# Patient Record
Sex: Female | Born: 1990 | Race: White | Hispanic: No | Marital: Married | Smoking: Never smoker
Health system: Southern US, Community
[De-identification: ages and names within clinical notes are randomized; demographics above are authoritative.]

## PROBLEM LIST (undated history)

## (undated) ENCOUNTER — Inpatient Hospital Stay (HOSPITAL_COMMUNITY): Payer: Self-pay

## (undated) DIAGNOSIS — R55 Syncope and collapse: Secondary | ICD-10-CM

## (undated) DIAGNOSIS — Z8619 Personal history of other infectious and parasitic diseases: Secondary | ICD-10-CM

## (undated) DIAGNOSIS — R002 Palpitations: Secondary | ICD-10-CM

## (undated) DIAGNOSIS — O468X9 Other antepartum hemorrhage, unspecified trimester: Secondary | ICD-10-CM

## (undated) DIAGNOSIS — G43909 Migraine, unspecified, not intractable, without status migrainosus: Secondary | ICD-10-CM

## (undated) DIAGNOSIS — O209 Hemorrhage in early pregnancy, unspecified: Secondary | ICD-10-CM

## (undated) DIAGNOSIS — I4729 Other ventricular tachycardia: Secondary | ICD-10-CM

## (undated) DIAGNOSIS — N809 Endometriosis, unspecified: Secondary | ICD-10-CM

## (undated) DIAGNOSIS — Z8489 Family history of other specified conditions: Secondary | ICD-10-CM

## (undated) DIAGNOSIS — Z872 Personal history of diseases of the skin and subcutaneous tissue: Secondary | ICD-10-CM

## (undated) DIAGNOSIS — N390 Urinary tract infection, site not specified: Secondary | ICD-10-CM

## (undated) DIAGNOSIS — O418X90 Other specified disorders of amniotic fluid and membranes, unspecified trimester, not applicable or unspecified: Secondary | ICD-10-CM

## (undated) DIAGNOSIS — Z9889 Other specified postprocedural states: Secondary | ICD-10-CM

## (undated) DIAGNOSIS — K08409 Partial loss of teeth, unspecified cause, unspecified class: Secondary | ICD-10-CM

## (undated) DIAGNOSIS — R112 Nausea with vomiting, unspecified: Secondary | ICD-10-CM

## (undated) DIAGNOSIS — Z86718 Personal history of other venous thrombosis and embolism: Secondary | ICD-10-CM

## (undated) DIAGNOSIS — K649 Unspecified hemorrhoids: Secondary | ICD-10-CM

## (undated) DIAGNOSIS — I472 Ventricular tachycardia: Secondary | ICD-10-CM

## (undated) DIAGNOSIS — Z87442 Personal history of urinary calculi: Secondary | ICD-10-CM

## (undated) DIAGNOSIS — I499 Cardiac arrhythmia, unspecified: Secondary | ICD-10-CM

## (undated) DIAGNOSIS — N83209 Unspecified ovarian cyst, unspecified side: Secondary | ICD-10-CM

## (undated) HISTORY — DX: Other specified disorders of amniotic fluid and membranes, unspecified trimester, not applicable or unspecified: O41.8X90

## (undated) HISTORY — DX: Ventricular tachycardia: I47.2

## (undated) HISTORY — DX: Palpitations: R00.2

## (undated) HISTORY — DX: Unspecified hemorrhoids: K64.9

## (undated) HISTORY — DX: Personal history of other venous thrombosis and embolism: Z86.718

## (undated) HISTORY — DX: Other ventricular tachycardia: I47.29

## (undated) HISTORY — DX: Cardiac arrhythmia, unspecified: I49.9

## (undated) HISTORY — DX: Other antepartum hemorrhage, unspecified trimester: O46.8X9

## (undated) HISTORY — DX: Personal history of other infectious and parasitic diseases: Z86.19

---
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
## 2009-01-04 ENCOUNTER — Ambulatory Visit: Payer: Self-pay | Admitting: Internal Medicine

## 2009-01-11 ENCOUNTER — Ambulatory Visit: Payer: Self-pay | Admitting: Internal Medicine

## 2009-01-11 DIAGNOSIS — R0989 Other specified symptoms and signs involving the circulatory and respiratory systems: Secondary | ICD-10-CM

## 2009-01-11 DIAGNOSIS — R0609 Other forms of dyspnea: Secondary | ICD-10-CM | POA: Insufficient documentation

## 2009-03-27 ENCOUNTER — Ambulatory Visit (HOSPITAL_COMMUNITY): Admission: RE | Admit: 2009-03-27 | Discharge: 2009-03-27 | Payer: Self-pay | Admitting: Obstetrics & Gynecology

## 2009-06-28 ENCOUNTER — Ambulatory Visit: Payer: Self-pay | Admitting: Family

## 2009-06-28 DIAGNOSIS — J019 Acute sinusitis, unspecified: Secondary | ICD-10-CM | POA: Insufficient documentation

## 2009-06-28 DIAGNOSIS — J309 Allergic rhinitis, unspecified: Secondary | ICD-10-CM | POA: Insufficient documentation

## 2009-08-08 ENCOUNTER — Encounter (INDEPENDENT_AMBULATORY_CARE_PROVIDER_SITE_OTHER): Payer: Self-pay | Admitting: *Deleted

## 2009-08-08 ENCOUNTER — Telehealth: Payer: Self-pay | Admitting: Family Medicine

## 2009-08-08 ENCOUNTER — Ambulatory Visit: Payer: Self-pay | Admitting: Family Medicine

## 2009-08-08 DIAGNOSIS — Z87448 Personal history of other diseases of urinary system: Secondary | ICD-10-CM

## 2009-08-08 LAB — CONVERTED CEMR LAB
Bilirubin Urine: NEGATIVE
Glucose, Urine, Semiquant: NEGATIVE
Ketones, urine, test strip: NEGATIVE
Nitrite: NEGATIVE
Protein, U semiquant: NEGATIVE
Specific Gravity, Urine: 1.02
Urobilinogen, UA: 0.2
WBC Urine, dipstick: NEGATIVE
pH: 6.5

## 2009-08-11 ENCOUNTER — Telehealth (INDEPENDENT_AMBULATORY_CARE_PROVIDER_SITE_OTHER): Payer: Self-pay | Admitting: *Deleted

## 2009-08-22 ENCOUNTER — Ambulatory Visit: Payer: Self-pay | Admitting: Family Medicine

## 2009-08-22 LAB — CONVERTED CEMR LAB
BUN: 13 mg/dL (ref 6–23)
Bilirubin Urine: NEGATIVE
Blood in Urine, dipstick: NEGATIVE
CO2: 30 meq/L (ref 19–32)
Calcium: 9.5 mg/dL (ref 8.4–10.5)
Chloride: 104 meq/L (ref 96–112)
Creatinine, Ser: 0.7 mg/dL (ref 0.4–1.2)
GFR calc non Af Amer: 115.97 mL/min (ref >60)
Glucose, Bld: 91 mg/dL (ref 70–99)
Glucose, Urine, Semiquant: NEGATIVE
Hgb A1c MFr Bld: 5.4 % (ref 4.6–6.5)
Ketones, urine, test strip: NEGATIVE
Nitrite: NEGATIVE
Potassium: 4.4 meq/L (ref 3.5–5.1)
Protein, U semiquant: NEGATIVE
Sodium: 139 meq/L (ref 135–145)
Specific Gravity, Urine: 1.02
Urobilinogen, UA: 0.2
WBC Urine, dipstick: NEGATIVE
pH: 5

## 2010-04-20 ENCOUNTER — Inpatient Hospital Stay (HOSPITAL_COMMUNITY)
Admission: AD | Admit: 2010-04-20 | Discharge: 2010-04-20 | Payer: Self-pay | Source: Home / Self Care | Attending: Obstetrics | Admitting: Obstetrics

## 2010-04-30 LAB — URINALYSIS, ROUTINE W REFLEX MICROSCOPIC
Bilirubin Urine: NEGATIVE
Hgb urine dipstick: NEGATIVE
Ketones, ur: NEGATIVE mg/dL
Nitrite: NEGATIVE
Protein, ur: NEGATIVE mg/dL
Specific Gravity, Urine: 1.01 (ref 1.005–1.030)
Urine Glucose, Fasting: NEGATIVE mg/dL
Urobilinogen, UA: 0.2 mg/dL (ref 0.0–1.0)
pH: 6.5 (ref 5.0–8.0)

## 2010-04-30 LAB — WET PREP, GENITAL
Clue Cells Wet Prep HPF POC: NONE SEEN
Trich, Wet Prep: NONE SEEN
Yeast Wet Prep HPF POC: NONE SEEN

## 2010-05-17 NOTE — Assessment & Plan Note (Signed)
Summary: allergies/kdc   Vital Signs:  Patient profile:   20 year old female Weight:      129.38 pounds BMI:     21.94 Temp:     97.8 degrees F oral Pulse rate:   72 / minute Pulse rhythm:   regular Resp:     16 per minute BP sitting:   120 / 72  (left arm) Cuff size:   regular  Vitals Entered By: Mervin Kung CMA (June 28, 2009 8:40 AM) CC: room 16  Sinus drainage x 2-3 weeks. Now has sore throat.   Primary Care Provider:  Laury Axon  CC:  room 16  Sinus drainage x 2-3 weeks. Now has sore throat.Marland Kitchen  History of Present Illness: Ms Jessica Diaz is a 20 year old female who presents today with c/o sinus pressure, green sinusdrainage, sore throat.  These symptoms started 3 weeks ago.  Denies fever but does note + fatigue.  Mild cough.  Has used OTC sinus/allergy med- with minimal relief.  Denies visual changes, notes + chronic floaters in left eye for which she sees eye Dr.    Allergies: 1)  ! * Narcotics  Physical Exam  General:  Well-developed,well-nourished,in no acute distress; alert,appropriate and cooperative throughout examination.  Voice has nasally sound Head:  Normocephalic and atraumatic without obvious abnormalities. No apparent alopecia or balding. Ears:  External ear exam shows no significant lesions or deformities.  Otoscopic examination reveals clear canals, tympanic membranes are intact bilaterally without bulging, retraction, inflammation or discharge. Hearing is grossly normal bilaterally. Neck:  palpable cervical lymph nodes without significant enlargement Lungs:  Normal respiratory effort, chest expands symmetrically. Lungs are clear to auscultation, no crackles or wheezes. Heart:  Normal rate and regular rhythm. S1 and S2 normal without gallop, murmur, click, rub or other extra sounds.   Impression & Recommendations:  Problem # 1:  SINUSITIS (ICD-473.9) Assessment New Will plan to treat with amoxicillin.  Pt advised to follow up as listed in pt  instructions The following medications were removed from the medication list:    Amoxicillin-pot Clavulanate 875-125 Mg Tabs (Amoxicillin-pot clavulanate) .Marland Kitchen... 1 two times a day with food Her updated medication list for this problem includes:    Amoxicillin 500 Mg Cap (Amoxicillin) .Marland Kitchen... Take 1 capsule by mouth three times a day x 10 days    Flonase 50 Mcg/act Susp (Fluticasone propionate) .Marland Kitchen..Marland Kitchen Two sprays each nostril once daily  Problem # 2:  ALLERGIC RHINITIS (ICD-477.9) Assessment: New Notes chronic nasal congestion in the spring.  Will add claritin and flonase.   Her updated medication list for this problem includes:    Claritin 10 Mg Tabs (Loratadine) ..... One tablet by mouth by mouth daily    Flonase 50 Mcg/act Susp (Fluticasone propionate) .Marland Kitchen..Marland Kitchen Two sprays each nostril once daily  Complete Medication List: 1)  Amoxicillin 500 Mg Cap (Amoxicillin) .... Take 1 capsule by mouth three times a day x 10 days 2)  Claritin 10 Mg Tabs (Loratadine) .... One tablet by mouth by mouth daily 3)  Flonase 50 Mcg/act Susp (Fluticasone propionate) .... Two sprays each nostril once daily  Patient Instructions: 1)  Call if you develop fever over 101, increasing sinus pressure, pain with eye movement, increased facial tenderness of swelling, or if you develop visual changes. Prescriptions: FLONASE 50 MCG/ACT SUSP (FLUTICASONE PROPIONATE) two sprays each nostril once daily  #1 x 2   Entered and Authorized by:   Lemont Fillers FNP   Signed by:   Lemont Fillers  FNP on 06/28/2009   Method used:   Electronically to        [facility].* (retail)       [address]       [place], [place]  [address]       Ph: [phone]       Fax: [phone]   RxID:   [id number] AMOXICILLIN 500 MG CAP (AMOXICILLIN) Take 1 capsule by mouth three times a day X 10 days  #30 x 0   Entered and Authorized by:   Lemont Fillers FNP   Signed by:   Lemont Fillers FNP on 06/28/2009   Method used:   Electronically to        [facility] (retail)       [address]       [place], [place]  [address]       Ph: [phone]       Fax: [phone]   RxID:   [id number]   Current Allergies (reviewed today): ! * NARCOTICS

## 2010-05-17 NOTE — Letter (Signed)
Summary: Out of Work  [facility] [employer]  [address] [place], [place] [address]   Phone: [phone]  Fax: [phone]    August 08, 2009   Employee:  Rowland Lathe    To Whom It May Concern:   For Medical reasons, please excuse the above named employee from work for the following dates:  Start:   August 07, 2009  End:   August 08, 2009  If you need additional information, please feel free to contact our office.         Sincerely,    Loreen Freud, DO

## 2010-05-17 NOTE — Progress Notes (Signed)
Summary: ATB change  Phone Note Call from Patient Call back at Home Phone [phone]   Summary of Call: Pt was prescribed Levaquin. Her insurance does not cover and she cannot afford the medication. Could you leave change to a cheaper ATB. Army Fossa CMA  August 08, 2009 2:31 PM  Initial call taken by: Army Fossa CMA,  August 08, 2009 2:31 PM  Follow-up for Phone Call        augmentin 875 two times a day for 10 days Follow-up by: Loreen Freud DO,  August 08, 2009 2:48 PM  Additional Follow-up for Phone Call Additional follow up Details #1::        Pt is aware.Army Fossa CMA  August 08, 2009 2:58 PM     New/Updated Medications: AUGMENTIN 875-125 MG TABS (AMOXICILLIN-POT CLAVULANATE) 1 by mouth two times a day for 10 days. Prescriptions: AUGMENTIN 875-125 MG TABS (AMOXICILLIN-POT CLAVULANATE) 1 by mouth two times a day for 10 days.  #20 x 0   Entered by:   Army Fossa CMA   Authorized by:   Loreen Freud DO   Signed by:   Army Fossa CMA on 08/08/2009   Method used:   Electronically to        [facility].* (retail)       [address]       [place], [place]  [address]       Ph: [phone]       Fax: [phone]   RxID:   [id number]

## 2010-05-17 NOTE — Assessment & Plan Note (Signed)
Summary: congested/cbs   Vital Signs:  Patient profile:   20 year old female Weight:      126 pounds Temp:     98.4 degrees F oral Pulse rate:   76 / minute Pulse rhythm:   regular BP sitting:   122 / 76  (left arm) Cuff size:   regular  Vitals Entered By: Army Fossa CMA (August 08, 2009 8:09 AM) CC: Pt here c/o sinus pressure and head congestion, chest congestion  since last thursday. Has been taking Zyrtec., URI symptoms, Back Pain   History of Present Illness:       This is a 20 year old woman who presents with URI symptoms.  The symptoms began 5 days ago.  The patient complains of nasal congestion, purulent nasal discharge, productive cough, and sick contacts, but denies sore throat and earache.  The patient denies fever, low-grade fever (<100.5 degrees), fever of 100.5-103 degrees, fever of 103.1-104 degrees, fever to >104 degrees, stiff neck, dyspnea, wheezing, rash, vomiting, diarrhea, use of an antipyretic, and response to antipyretic.  The patient also reports sneezing, headache, and severe fatigue.  The patient denies itchy watery eyes, itchy throat, seasonal symptoms, response to antihistamine, and muscle aches.  The patient denies the following risk factors for Strep sinusitis: unilateral facial pain, unilateral nasal discharge, poor response to decongestant, double sickening, tooth pain, Strep exposure, tender adenopathy, and absence of cough.           The patient also presents with Back Pain.  The symptoms began 1 week ago.  No known injury  + urinary frequency.  The patient denies fever, chills, weakness, loss of sensation, fecal incontinence, urinary incontinence, urinary retention, dysuria, rest pain, inability to work, and inability to care for self.  The pain is located in the right low back and left low back.  The pain began at home and suddenly.  The pain is made worse by urination.    Allergies: 1)  ! * Narcotics  Past History:  Past medical, surgical, family  and social histories (including risk factors) reviewed for relevance to current acute and chronic problems.  Past Medical History: Reviewed history from 06/25/2007 and no changes required. anxiety endometriosis  Past Surgical History: Reviewed history from 06/25/2007 and no changes required. [id number] ENDOMETRIOSIS-MENOPUSE TREATMENT LEFT KNEE SURGERY HOSPITILIZED FOR GI PROBLEMS 6TH GRADE (AGE 38)  Family History: Reviewed history from 07/05/2008 and no changes required. MOTHER:LIVING, LUMP REMOVED RIGHT BREAST FATHER:LIVING 4 SISTERS: LIVING OPEN HEART SURGERY-FATHER AGE 39 HEART ATTACK-MGF AGE 102 DM-MGGM LUNG CANCER-PGF Family History of Cholesterol Disease: FAT Family History Diabetes 1st degree relative---Mom with gestational  Social History: Reviewed history from 06/25/2007 and no changes required. Negative history of passive tobacco smoke exposure.  Care taker verifies today that the child's current immunizations are up to date.  Not using alcohol Not using substances of abuse  Review of Systems      See HPI  Physical Exam  General:  Well-developed,well-nourished,in no acute distress; alert,appropriate and cooperative throughout examination Ears:  External ear exam shows no significant lesions or deformities.  Otoscopic examination reveals clear canals, tympanic membranes are intact bilaterally without bulging, retraction, inflammation or discharge. Hearing is grossly normal bilaterally. Nose:  no external deformity, L frontal sinus tenderness, and R frontal sinus tenderness.   Mouth:  Oral mucosa and oropharynx without lesions or exudates.  Teeth in good repair. Neck:  No deformities, masses, or tenderness noted. Lungs:  Normal respiratory effort, chest expands symmetrically. Lungs are  clear to auscultation, no crackles or wheezes. Heart:  Normal rate and regular rhythm. S1 and S2 normal without gallop, murmur, click, rub or other extra sounds. Abdomen:  no flank  pain with palpation no pain with movement Psych:  Cognition and judgment appear intact. Alert and cooperative with normal attention span and concentration. No apparent delusions, illusions, hallucinations   Impression & Recommendations:  Problem # 1:  SINUSITIS (ICD-473.9)  The following medications were removed from the medication list:    Flonase 50 Mcg/act Susp (Fluticasone propionate) .Marland Kitchen..Marland Kitchen Two sprays each nostril once daily Her updated medication list for this problem includes:    Levaquin 500 Mg Tabs (Levofloxacin) .Marland Kitchen... 1 by mouth once daily    Cheratussin Ac 100-10 Mg/28ml Syrp (Guaifenesin-codeine) .Marland Kitchen... 1-2 tsp by mouth at bedtime as needed cough    Veramyst 27.5 Mcg/spray Susp (Fluticasone furoate) .Marland Kitchen... 2 sprays each nostril once daily  Orders: UA Dipstick w/o Micro (manual) (04540)  Take antibiotics for full duration. Discussed treatment options including indications for coronal CT scan of sinuses and ENT referral.   Problem # 2:  HEMATURIA, HX OF (ICD-V13.09)  Orders: T-Culture, Urine ([id number]) UA Dipstick w/o Micro (manual) (95621)  Complete Medication List: 1)  Levaquin 500 Mg Tabs (Levofloxacin) .Marland Kitchen.. 1 by mouth once daily 2)  Cheratussin Ac 100-10 Mg/21ml Syrp (Guaifenesin-codeine) .Marland Kitchen.. 1-2 tsp by mouth at bedtime as needed cough 3)  Veramyst 27.5 Mcg/spray Susp (Fluticasone furoate) .... 2 sprays each nostril once daily Prescriptions: CHERATUSSIN AC 100-10 MG/5ML SYRP (GUAIFENESIN-CODEINE) 1-2 tsp by mouth at bedtime as needed cough  #6 oz x 0   Entered and Authorized by:   Loreen Freud DO   Signed by:   Loreen Freud DO on 08/08/2009   Method used:   Print then Give to Patient   RxID:   [id number] LEVAQUIN 500 MG TABS (LEVOFLOXACIN) 1 by mouth once daily  #10 x 0   Entered and Authorized by:   Loreen Freud DO   Signed by:   Loreen Freud DO on 08/08/2009   Method used:   Electronically to        [facility] (retail)       [address]       [place], [place]  [address]       Ph: [phone]       Fax: [phone]   RxID:   [id number]   Laboratory Results   Urine Tests    Routine Urinalysis   Color: yellow Appearance: Clear Glucose: negative   (Normal Range: Negative) Bilirubin: negative   (Normal Range: Negative) Ketone: negative   (Normal Range: Negative) Spec. Gravity: 1.020   (Normal Range: 1.003-1.035) Blood: large   (Normal Range: Negative) pH: 6.5   (Normal Range: 5.0-8.0) Protein: negative   (Normal Range: Negative) Urobilinogen: 0.2   (Normal Range: 0-1) Nitrite: negative   (Normal Range: Negative) Leukocyte Esterace: negative   (Normal Range: Negative)    Comments: Army Fossa CMA  August 08, 2009 8:34 AM

## 2010-05-17 NOTE — Progress Notes (Signed)
Summary: Urine culture   Phone Note Outgoing Call   Call placed by: Army Fossa CMA,  August 11, 2009 8:15 AM Summary of Call: Regarding urine culture, LMTCB:  contaminated--- recheck if symptomatic Signed by Loreen Freud DO on 08/10/2009 at 4:18 PM  Follow-up for Phone Call        Pt is states that she has not had any back pain, no burning when urinating. Army Fossa CMA  August 11, 2009 9:01 AM

## 2010-06-26 ENCOUNTER — Inpatient Hospital Stay (HOSPITAL_COMMUNITY)
Admission: AD | Admit: 2010-06-26 | Discharge: 2010-06-26 | Disposition: A | Discharge status: Home / Self Care | Payer: Medicaid Other | Source: Ambulatory Visit | Attending: Obstetrics | Admitting: Obstetrics

## 2010-06-26 DIAGNOSIS — O212 Late vomiting of pregnancy: Secondary | ICD-10-CM | POA: Insufficient documentation

## 2010-06-26 LAB — URINALYSIS, ROUTINE W REFLEX MICROSCOPIC
Bilirubin Urine: NEGATIVE
Glucose, UA: NEGATIVE mg/dL
Hgb urine dipstick: NEGATIVE
Ketones, ur: >80 mg/dL — AB
Nitrite: NEGATIVE
Protein, ur: NEGATIVE mg/dL
Specific Gravity, Urine: 1.025 (ref 1.005–1.030)
Urobilinogen, UA: 0.2 mg/dL (ref 0.0–1.0)
pH: 7 (ref 5.0–8.0)

## 2010-07-11 ENCOUNTER — Inpatient Hospital Stay (HOSPITAL_COMMUNITY): Payer: Medicaid Other

## 2010-07-11 ENCOUNTER — Inpatient Hospital Stay (HOSPITAL_COMMUNITY)
Admission: AD | Admit: 2010-07-11 | Discharge: 2010-07-14 | DRG: 781 | Disposition: A | Discharge status: Home / Self Care | Payer: Medicaid Other | Source: Ambulatory Visit | Attending: Obstetrics & Gynecology | Admitting: Obstetrics & Gynecology

## 2010-07-11 ENCOUNTER — Encounter (HOSPITAL_COMMUNITY): Payer: Self-pay | Admitting: Radiology

## 2010-07-11 DIAGNOSIS — N200 Calculus of kidney: Secondary | ICD-10-CM

## 2010-07-11 DIAGNOSIS — R109 Unspecified abdominal pain: Secondary | ICD-10-CM | POA: Diagnosis present

## 2010-07-11 DIAGNOSIS — N1330 Unspecified hydronephrosis: Secondary | ICD-10-CM | POA: Diagnosis present

## 2010-07-11 DIAGNOSIS — R300 Dysuria: Secondary | ICD-10-CM | POA: Diagnosis present

## 2010-07-11 DIAGNOSIS — O99891 Other specified diseases and conditions complicating pregnancy: Principal | ICD-10-CM | POA: Diagnosis present

## 2010-07-11 DIAGNOSIS — O26839 Pregnancy related renal disease, unspecified trimester: Secondary | ICD-10-CM | POA: Diagnosis present

## 2010-07-11 DIAGNOSIS — N2889 Other specified disorders of kidney and ureter: Secondary | ICD-10-CM | POA: Diagnosis present

## 2010-07-11 LAB — URINALYSIS, ROUTINE W REFLEX MICROSCOPIC
Bilirubin Urine: NEGATIVE
Glucose, UA: NEGATIVE mg/dL
Ketones, ur: NEGATIVE mg/dL
Leukocytes, UA: NEGATIVE
Nitrite: NEGATIVE
Protein, ur: NEGATIVE mg/dL
Specific Gravity, Urine: 1.025 (ref 1.005–1.030)
Urobilinogen, UA: 0.2 mg/dL (ref 0.0–1.0)
pH: 6 (ref 5.0–8.0)

## 2010-07-11 LAB — URINE MICROSCOPIC-ADD ON

## 2010-07-12 ENCOUNTER — Ambulatory Visit (HOSPITAL_COMMUNITY): Payer: Medicaid Other

## 2010-07-12 LAB — URINE CULTURE
Colony Count: NO GROWTH
Culture  Setup Time: 201203281407
Culture: NO GROWTH

## 2010-07-12 LAB — CBC
HCT: 33.4 % — ABNORMAL LOW (ref 36.0–46.0)
Hemoglobin: 11 g/dL — ABNORMAL LOW (ref 12.0–15.0)
MCH: 29.6 pg (ref 26.0–34.0)
MCHC: 32.9 g/dL (ref 30.0–36.0)
MCV: 90 fL (ref 78.0–100.0)
Platelets: 169 10*3/uL (ref 150–400)
RBC: 3.71 MIL/uL — ABNORMAL LOW (ref 3.87–5.11)
RDW: 13.3 % (ref 11.5–15.5)
WBC: 10.9 10*3/uL — ABNORMAL HIGH (ref 4.0–10.5)

## 2010-07-12 LAB — COMPREHENSIVE METABOLIC PANEL
ALT: 18 U/L (ref 0–35)
AST: 21 U/L (ref 0–37)
Albumin: 2.4 g/dL — ABNORMAL LOW (ref 3.5–5.2)
Alkaline Phosphatase: 69 U/L (ref 39–117)
BUN: 4 mg/dL — ABNORMAL LOW (ref 6–23)
CO2: 22 mEq/L (ref 19–32)
Calcium: 8.4 mg/dL (ref 8.4–10.5)
Chloride: 107 mEq/L (ref 96–112)
Creatinine, Ser: 0.37 mg/dL — ABNORMAL LOW (ref 0.4–1.2)
GFR calc Af Amer: >60 mL/min (ref >60)
GFR calc non Af Amer: >60 mL/min (ref >60)
Glucose, Bld: 119 mg/dL — ABNORMAL HIGH (ref 70–99)
Potassium: 3.5 mEq/L (ref 3.5–5.1)
Sodium: 136 mEq/L (ref 135–145)
Total Bilirubin: 0.2 mg/dL — ABNORMAL LOW (ref 0.3–1.2)
Total Protein: 5.3 g/dL — ABNORMAL LOW (ref 6.0–8.3)

## 2010-07-12 NOTE — H&P (Signed)
  NAME:  Jessica Diaz, Jessica Diaz NO.:  [id number]  MEDICAL RECORD NO.:  [id number]           PATIENT TYPE:  I  LOCATION:  9158                          FACILITY:  [facility]  PHYSICIAN:  Roseanna Rainbow, M.D.DATE OF BIRTH:  Aug 25, 1990  DATE OF ADMISSION:  07/11/2010 DATE OF DISCHARGE:                             HISTORY & PHYSICAL   CHIEF COMPLAINT:  The patient is a 20 year old gravida 1, para 0 with an estimated date of confinement of Sep 07, 2010 with an intrauterine pregnancy at 31.5 weeks complaining of right-sided flank pain.  HISTORY OF PRESENT ILLNESS:  Please see the above.  The patient denies any fever, vaginal bleeding, recent trauma, rupture of membranes.  The patient also reports associated nausea, vomiting, and dysuria.  ALLERGIES:  PERCOCET, DECONGESTANTS.  MEDICATIONS:  Please see the medication reconciliation form.  PRENATAL LABS:  Chlamydia probe negative.  Urine culture and sensitivity, no growth.  Two-hour GTT normal.  Hepatitis B surface antigen negative.  Hemoglobin 11.6, hematocrit 35.6.  HIV nonreactive, blood type is A+, antibody screen negative.  Quad screen negative. Varicella immune.  Platelets 193,000.  Ultrasound on March 22, 2011 at 15 weeks and 5/7 days, dates the pregnancy.  The anatomic survey was limited.  PAST GYN HISTORY: 1. History of endometriosis. 2. Diagnostic laparoscopy.  PAST MEDICAL HISTORY: 1. Migraine headaches. 2. Heart murmur.  PAST SURGICAL HISTORY:  Please see the above.  Left knee arthroscopy.  SOCIAL HISTORY:  She is a Consulting civil engineer.  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  She is single, does not give any significant history of alcohol usage.  She has no significant smoking history.  Denies illicit drug use.  FAMILY HISTORY:  Remarkable for adult-onset diabetes, hypertension.  REVIEW OF SYSTEMS:  GU:  Please see the above.  PHYSICAL EXAMINATION:  VITAL SIGNS:  Stable, afebrile.  The fetal heart rate is 140 beats per  minute, moderate long-term variability. Tocodynamometer no uterine contractions. GENERAL:  Moderate distress. ABDOMEN:  Gravid. PELVIC:  Per the mid-level provider.  LABORATORY DATA:  Urinalysis micro, few squamous epithelial cells, rbc's 0-2 red blood cells per high power field, specific gravity 1.025, trace blood.  Renal ultrasound:  Moderate right and mild left renal pyelectasis.  ASSESSMENT:  Intrauterine pregnancy at 31 plus weeks, rule out urolithiasis.  PLAN:  Admission, supportive care, analgesics, IV hydration, strain the urine.     Roseanna Rainbow, M.D.     Judee Clara  D:  07/11/2010  T:  07/12/2010  Job:  [id number]  Electronically Signed by Antionette Char M.D. on 07/12/2010 07:18:29 PM

## 2010-07-18 NOTE — Discharge Summary (Signed)
  NAME:  Jessica Diaz, Jessica Diaz NO.:  [id number]  MEDICAL RECORD NO.:  [id number]           PATIENT TYPE:  I  LOCATION:  9158                          [facility]  PHYSICIAN:  Roseanna Rainbow, M.D.DATE OF BIRTH:  1990-06-15  DATE OF ADMISSION:  07/11/2010 DATE OF DISCHARGE:  07/14/2010                              DISCHARGE SUMMARY   CHIEF COMPLAINT:  The patient is a 20 year old gravida 1, para 0 with an estimated date of confinement of Sep 07, 2010, with an intrauterine pregnancy at 31.5 weeks, complaining of right-sided flank pain.  Please see the dictated history and physical.  HOSPITAL COURSE:  The patient was admitted.  She was given parenteral antibiotics for a presumptive early ascending urinary tract infection. She was transitioned from IV boluses of morphine to a morphine PCA. The urine was strained.  A renal ultrasound as well as an MRI of the abdomen and pelvis showed physiologic right hydronephrosis without evidence of obstruction.  The urine culture showed no growth.  Other laboratory data included a CBC and a CMET that were within normal limits as well.  The patient's pain gradually improved prior to discharge.  DISCHARGE DIAGNOSES: 1. Intrauterine pregnancy at 32 weeks. 2. Right-sided flank pain, questionable etiology, possible related to     the physiologic hydronephrosis of pregnancy and ureteral spasm.  CONDITION:  Stable.  DIET:  Regular.  ACTIVITY:  Ad lib.  MEDICATIONS: 1. Prenatal vitamins. 2. Ultram 1-2 tabs every 6 hours as needed.  DISPOSITION:  The patient has a followup appointment in the office for the following week.     Roseanna Rainbow, M.D.     Judee Clara  D:  07/14/2010  T:  07/15/2010  Job:  [id number]  Electronically Signed by Antionette Char M.D. on 07/18/2010 10:05:33 PM

## 2010-08-28 NOTE — Assessment & Plan Note (Signed)
[facility]                            CARDIOLOGY OFFICE NOTE   NAME:BLANKENSHIP, Aaryana A                 MRN:          [id number]  DATE:07/30/2007                            DOB:          04-26-1990    NEW PATIENT EVALUATION:   REASON FOR VISIT:  Chest pain, shortness of breath and palpitations.   Jessica Diaz is a very pleasant 20 year old woman who is the daughter of Mr.  Wendie Agreste, who we follow in our clinic.  She has a history  of endometriosis.  Otherwise, her medical history is unremarkable.  She  denies any history of known heart disease.  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   In the past she has had trouble with endometriosis and actually has  undergone surgery for this.  She was on Lupron Depot shots until late  last year when she was switched to [place].   She states that she has had minor episodes of chest pain in the past;  however, in December she fell down the stairs on her back and since that  time she notes that she has had episodes where she feels palpitations  come on and gets quite short of breath and has chest pain.  Her parents  say during these episodes she becomes quite pale and weak.  She has felt  presyncopal but never passed out completely.  These episodes usually  last about 10 minutes and resolve.  There are no apparent triggers.  At  times they happen on a daily basis, other times they just come and go.  She underwent an echocardiogram, which showed an EF of 60-65%.  There  was a flat closure of the mitral valve with no overt prolapse.  There  was trivial mitral regurgitation and there were no other abnormalities.  She denies any wheezing and she has not had any problems with exertion.  She does not do drugs.  Her periods have been quite regular.   In talking with her family more about this, her mother correlates the  onset of her symptoms  most closely with stopping her Lupron Depot shots.   REVIEW OF SYSTEMS:  Notable for headaches, otherwise all systems  negative except for HPI and problem list.   PROBLEM LIST:  Endometriosis as per HPI.   CURRENT MEDICATIONS:  Seasonique.   SOCIAL HISTORY:  She is a Holiday representative in Producer, television/film/video.  She lives with her  parents.  She has a boyfriend.  She does not smoke or drink alcohol.  She denies any drug use.   FAMILY HISTORY:  Maternal grandfather died at 78 of a myocardial  infarction.  Father has a history of aortic valve replacement.  Mother  has a history of migraines.   PHYSICAL EXAM:  She is a well-appearing woman in no acute distress.  Ambulates around the clinic without any difficulty.  Blood pressure is 110/72, heart rate is 84, weight is 128.  HEENT:  Normal.  NECK:  Supple.  There is no JVD.  Carotids are 2+ bilaterally without  any  bruits.  There is no lymphadenopathy or thyromegaly.  CARDIAC:  She has a prominent PMI but this is not displaced.  She has a  regular rate and rhythm.  No murmurs, rubs or gallops and no click.  LUNGS:  Clear.  ABDOMEN:  Soft, nontender, nondistended.  There is no  hepatosplenomegaly, no bruits, no masses.  Good bowel sounds.  EXTREMITIES:  Warm with no cyanosis, clubbing or edema.  No rash.  NEUROLOGIC:  Alert and oriented x3.  Cranial nerves II-XII are intact.  Moves all four extremities without difficulty.   EKG shows normal sinus rhythm with just minimal T-wave flattening in V2  and in lead III.  She has multiple strips from her CardioNet monitor,  all of which just show sinus arrhythmia with no significant arrhythmia.  She did have one episode she said, which was her most severe episode, in  which she was not wearing the monitor when it occurred.   ASSESSMENT/PLAN:  1. Chest pain, shortness of breath and palpitations.  I had a long      talk with Daylee and her family about the possible causes.  Given      the fact that she has had a  normal monitor, I do not think that      this is arrhythmogenic at this time.  Given the timeline, I am      quite concerned that these may be hormonal in nature.  I actually      spoke with Dr. Fransico Michael regarding possible further evaluation of      her hormonal status.  I suggest that we might even consider Effexor      if needed.  At this time she will go back and see Dr. Shon Baton for a      hormonal evaluation and if she continues to have symptoms, as I      said, we could consider low-dose Effexor.  Would not pursue further      cardiac workup at this time.  We will keep her monitor on one more      week to see if we can catch another major episode.     Bevelyn Buckles. Bensimhon, MD  Electronically Signed    DRB/MedQ  DD: 07/30/2007  DT: 07/30/2007  Job #: [id number]   cc:   Fransico Michael

## 2010-09-05 ENCOUNTER — Other Ambulatory Visit: Payer: Self-pay | Admitting: Obstetrics & Gynecology

## 2010-09-05 DIAGNOSIS — O480 Post-term pregnancy: Secondary | ICD-10-CM

## 2010-09-12 ENCOUNTER — Ambulatory Visit (HOSPITAL_COMMUNITY)
Admission: RE | Admit: 2010-09-12 | Discharge: 2010-09-12 | Disposition: A | Discharge status: Home / Self Care | Payer: Medicaid Other | Source: Ambulatory Visit | Attending: Obstetrics & Gynecology | Admitting: Obstetrics & Gynecology

## 2010-09-12 DIAGNOSIS — O480 Post-term pregnancy: Secondary | ICD-10-CM | POA: Insufficient documentation

## 2010-09-12 DIAGNOSIS — Z3689 Encounter for other specified antenatal screening: Secondary | ICD-10-CM | POA: Insufficient documentation

## 2010-09-16 ENCOUNTER — Inpatient Hospital Stay (HOSPITAL_COMMUNITY)
Admission: AD | Admit: 2010-09-16 | Discharge: 2010-09-19 | DRG: 775 | Disposition: A | Discharge status: Home / Self Care | Payer: Medicaid Other | Source: Ambulatory Visit | Attending: Obstetrics & Gynecology | Admitting: Obstetrics & Gynecology

## 2010-09-16 DIAGNOSIS — O480 Post-term pregnancy: Principal | ICD-10-CM | POA: Diagnosis present

## 2010-09-16 DIAGNOSIS — Z30430 Encounter for insertion of intrauterine contraceptive device: Secondary | ICD-10-CM

## 2010-09-16 LAB — CBC
Hemoglobin: 10.7 g/dL — ABNORMAL LOW (ref 12.0–15.0)
MCH: 27.1 pg (ref 26.0–34.0)
MCV: 84.3 fL (ref 78.0–100.0)
Platelets: 174 10*3/uL (ref 150–400)
RBC: 3.95 MIL/uL (ref 3.87–5.11)
WBC: 9 10*3/uL (ref 4.0–10.5)

## 2010-09-17 LAB — RPR: RPR Ser Ql: NONREACTIVE

## 2010-09-19 LAB — CBC
HCT: 31.1 % — ABNORMAL LOW (ref 36.0–46.0)
Hemoglobin: 9.9 g/dL — ABNORMAL LOW (ref 12.0–15.0)
MCH: 27.3 pg (ref 26.0–34.0)
MCHC: 31.8 g/dL (ref 30.0–36.0)
RBC: 3.63 MIL/uL — ABNORMAL LOW (ref 3.87–5.11)

## 2011-01-18 LAB — COMPREHENSIVE METABOLIC PANEL
BUN: 7 mg/dL (ref 6–23)
CO2: 24 mEq/L (ref 19–32)
Calcium: 9.4 mg/dL (ref 8.4–10.5)
Chloride: 104 mEq/L (ref 96–112)
Creatinine, Ser: 0.72 mg/dL (ref 0.4–1.2)
GFR calc non Af Amer: >60 mL/min (ref >60)
Glucose, Bld: 104 mg/dL — ABNORMAL HIGH (ref 70–99)
Total Bilirubin: 0.6 mg/dL (ref 0.3–1.2)

## 2011-01-18 LAB — CBC
HCT: 41.2 % (ref 36.0–46.0)
Hemoglobin: 13.6 g/dL (ref 12.0–15.0)
Platelets: 179 10*3/uL (ref 150–400)
RBC: 4.68 MIL/uL (ref 3.87–5.11)
WBC: 13.3 10*3/uL — ABNORMAL HIGH (ref 4.0–10.5)

## 2011-01-18 LAB — POCT I-STAT, CHEM 8
BUN: 5 mg/dL — ABNORMAL LOW (ref 6–23)
Calcium, Ion: 1.02 mmol/L — ABNORMAL LOW (ref 1.12–1.32)
Chloride: 106 mEq/L (ref 96–112)
HCT: 42 % (ref 36.0–46.0)
Hemoglobin: 13.6 g/dL (ref 12.0–15.0)
Potassium: 3.6 mEq/L (ref 3.5–5.1)
Sodium: 135 mEq/L (ref 135–145)
Sodium: 138 mEq/L (ref 135–145)
TCO2: 21 mmol/L (ref 0–100)

## 2011-01-18 LAB — RAPID URINE DRUG SCREEN, HOSP PERFORMED
Benzodiazepines: NOT DETECTED
Cocaine: NOT DETECTED
Opiates: NOT DETECTED

## 2011-01-18 LAB — URINALYSIS, ROUTINE W REFLEX MICROSCOPIC
Glucose, UA: NEGATIVE mg/dL
Nitrite: NEGATIVE
Specific Gravity, Urine: 1.014 (ref 1.005–1.030)
pH: 7 (ref 5.0–8.0)

## 2011-01-18 LAB — DIFFERENTIAL
Eosinophils Relative: 0 % (ref 0–5)
Lymphocytes Relative: 5 % — ABNORMAL LOW (ref 12–46)
Lymphs Abs: 0.7 10*3/uL (ref 0.7–4.0)
Monocytes Relative: 5 % (ref 3–12)

## 2011-01-18 LAB — POCT PREGNANCY, URINE: Preg Test, Ur: NEGATIVE

## 2011-01-18 LAB — POCT CARDIAC MARKERS: Troponin i, poc: <0.05 ng/mL (ref 0.00–0.09)

## 2011-01-29 ENCOUNTER — Encounter (HOSPITAL_COMMUNITY): Payer: Self-pay | Admitting: *Deleted

## 2011-04-16 DIAGNOSIS — R55 Syncope and collapse: Secondary | ICD-10-CM

## 2011-04-16 DIAGNOSIS — K08409 Partial loss of teeth, unspecified cause, unspecified class: Secondary | ICD-10-CM

## 2011-04-16 HISTORY — PX: WISDOM TOOTH EXTRACTION: SHX21

## 2011-04-16 HISTORY — DX: Partial loss of teeth, unspecified cause, unspecified class: K08.409

## 2011-04-16 HISTORY — DX: Syncope and collapse: R55

## 2011-09-30 ENCOUNTER — Ambulatory Visit: Payer: Medicaid Other | Admitting: Family Medicine

## 2011-09-30 ENCOUNTER — Encounter: Payer: Self-pay | Admitting: Family Medicine

## 2011-09-30 ENCOUNTER — Ambulatory Visit (INDEPENDENT_AMBULATORY_CARE_PROVIDER_SITE_OTHER): Payer: No Typology Code available for payment source | Admitting: Family Medicine

## 2011-09-30 VITALS — BP 110/75 | HR 67 | Temp 98.3°F | Ht 65.0 in | Wt 124.2 lb

## 2011-09-30 DIAGNOSIS — J029 Acute pharyngitis, unspecified: Secondary | ICD-10-CM | POA: Insufficient documentation

## 2011-09-30 DIAGNOSIS — R55 Syncope and collapse: Secondary | ICD-10-CM

## 2011-09-30 NOTE — Assessment & Plan Note (Signed)
New to provider.  Recurrent for pt.  sxs most consistent w/ orthostatic/vagal episode.  Stressed importance of increased hydration, added salt in diet, changing positions slowly.  Check labs to r/o anemia, thyroid or electrolyte abnormality.  Reviewed supportive care and red flags that should prompt return.  Pt expressed understanding and is in agreement w/ plan.

## 2011-09-30 NOTE — Patient Instructions (Addendum)
Increase your water intake and add salt to your diet Change positions slowly to allow your body time to adjust We'll notify you of your lab results STOP the Doxy Call with any questions or concerns Hang in there!!!

## 2011-09-30 NOTE — Assessment & Plan Note (Signed)
New.  Most likely viral as rapid strep now (-) x2.  Reviewed supportive care and red flags that should prompt return.  Pt expressed understanding and is in agreement w/ plan.

## 2011-09-30 NOTE — Progress Notes (Signed)
  Subjective:    Patient ID: Jessica Diaz, female    DOB: 1991-03-20, 21 y.o.   MRN: [id number]  HPI Dizziness- passed out twice yesterday at home, again developed sxs while leaving UC and had to lie on the floor.  Was given Doxy for 'pink throat' but was not having pain and strep was negative.  Had normal UA and negative Upreg.  Still having dizziness and feeling fatigued and weak.  Lightheaded feeling only occurs when she stands or changes positions.  Drinking a lot of water.  Limited salt intake.  Felt fine Saturday night, developed sxs when she stood up to change son's diaper in the AM.  Has hx of similar w/ decongestant use (2009).  Occurred again in 2011, accompanied by 'small seizure'.  Tests were all negative.   Review of Systems For ROS see HPI     Objective:   Physical Exam  Vitals reviewed. Constitutional: She is oriented to person, place, and time. She appears well-developed and well-nourished. No distress.  HENT:  Head: Normocephalic and atraumatic.  Nose: Nose normal.       TMs normal bilaterally No TTP over sinuses Bilateral tonsillar enlargement w/ erythema but no exudate  Neck: Normal range of motion. Neck supple.  Cardiovascular: Normal rate, regular rhythm and normal heart sounds.   Pulmonary/Chest: Effort normal and breath sounds normal. No respiratory distress. She has no wheezes. She has no rales.  Musculoskeletal: She exhibits no edema.  Lymphadenopathy:    She has cervical adenopathy.  Neurological: She is alert and oriented to person, place, and time. She has normal reflexes. No cranial nerve deficit. Coordination normal.  Skin: Skin is warm and dry.  Psychiatric: She has a normal mood and affect. Her behavior is normal.          Assessment & Plan:

## 2011-10-01 LAB — CBC WITH DIFFERENTIAL/PLATELET
Basophils Relative: 0.3 % (ref 0.0–3.0)
Eosinophils Relative: 0.7 % (ref 0.0–5.0)
Lymphocytes Relative: 16.3 % (ref 12.0–46.0)
Neutrophils Relative %: 70.9 % (ref 43.0–77.0)
RBC: 5.05 Mil/uL (ref 3.87–5.11)
WBC: 8.2 10*3/uL (ref 4.5–10.5)

## 2011-10-01 LAB — BASIC METABOLIC PANEL
BUN: 15 mg/dL (ref 6–23)
Chloride: 109 mEq/L (ref 96–112)
Glucose, Bld: 85 mg/dL (ref 70–99)
Potassium: 3.8 mEq/L (ref 3.5–5.1)

## 2011-10-02 ENCOUNTER — Encounter: Payer: Self-pay | Admitting: *Deleted

## 2011-11-14 ENCOUNTER — Other Ambulatory Visit: Payer: Self-pay | Admitting: Obstetrics

## 2011-11-14 DIAGNOSIS — R102 Pelvic and perineal pain: Secondary | ICD-10-CM

## 2011-11-15 ENCOUNTER — Ambulatory Visit (HOSPITAL_COMMUNITY)
Admission: RE | Admit: 2011-11-15 | Discharge: 2011-11-15 | Disposition: A | Discharge status: Home / Self Care | Payer: No Typology Code available for payment source | Source: Ambulatory Visit | Attending: Obstetrics | Admitting: Obstetrics

## 2011-11-15 DIAGNOSIS — N949 Unspecified condition associated with female genital organs and menstrual cycle: Secondary | ICD-10-CM | POA: Insufficient documentation

## 2011-11-15 DIAGNOSIS — R102 Pelvic and perineal pain: Secondary | ICD-10-CM

## 2011-11-15 DIAGNOSIS — Z30431 Encounter for routine checking of intrauterine contraceptive device: Secondary | ICD-10-CM | POA: Insufficient documentation

## 2011-11-22 ENCOUNTER — Encounter: Payer: Self-pay | Admitting: Family Medicine

## 2011-11-22 ENCOUNTER — Ambulatory Visit (INDEPENDENT_AMBULATORY_CARE_PROVIDER_SITE_OTHER): Payer: No Typology Code available for payment source | Admitting: Family Medicine

## 2011-11-22 VITALS — BP 115/75 | HR 81 | Temp 97.9°F | Ht 65.0 in | Wt 129.6 lb

## 2011-11-22 DIAGNOSIS — R6889 Other general symptoms and signs: Secondary | ICD-10-CM

## 2011-11-22 DIAGNOSIS — T63391A Toxic effect of venom of other spider, accidental (unintentional), initial encounter: Secondary | ICD-10-CM

## 2011-11-22 DIAGNOSIS — G43909 Migraine, unspecified, not intractable, without status migrainosus: Secondary | ICD-10-CM | POA: Insufficient documentation

## 2011-11-22 DIAGNOSIS — T63301A Toxic effect of unspecified spider venom, accidental (unintentional), initial encounter: Secondary | ICD-10-CM | POA: Insufficient documentation

## 2011-11-22 DIAGNOSIS — T6391XA Toxic effect of contact with unspecified venomous animal, accidental (unintentional), initial encounter: Secondary | ICD-10-CM

## 2011-11-22 DIAGNOSIS — R7989 Other specified abnormal findings of blood chemistry: Secondary | ICD-10-CM

## 2011-11-22 LAB — T4, FREE: Free T4: 0.8 ng/dL (ref 0.60–1.60)

## 2011-11-22 NOTE — Assessment & Plan Note (Signed)
New to provider.  Pt reports hx of this but recent increase in frequency.  Has appt w/ neuro upcoming next week.  Will follow along and assist as able.

## 2011-11-22 NOTE — Patient Instructions (Addendum)
We'll notify you of your lab results Please have the neurologist fax me a copy of their report Continue to drink your water! The bite looks good- no need for antibiotics at this time Call with any questions or concerns Hang in there!!!

## 2011-11-22 NOTE — Assessment & Plan Note (Signed)
Pt's TSH was normal in June but apparently abnormal at GYN.  Will check labs to determine whether pt requires medication for this.

## 2011-11-22 NOTE — Progress Notes (Signed)
  Subjective:    Patient ID: Jessica Diaz, female    DOB: 27-Apr-1990, 21 y.o.   MRN: [id number]  HPI Spider bite- occurred on Wednesday, was tx'd for possible black widow bite.  Is on prednisone, anti-venom, abx.  Area remains itchy but not painful or swollen.  Abnormal TSH- was having issues w/ mirena and went to GYN, had lab work done that showed abnormal TSH.  Told MD that she was having extreme fatigue so lab work was done.  TSH was normal in June.  Reports weakness, near syncope on multiple occasions.  Migraines- has appt upcoming on Tuesday w/ Neuro due to multiple migraines weekly.  Also having memory loss.  Will forget what she's saying mid conversation.  'i don't know what's wrong w/ me'.     Review of Systems For ROS see HPI     Objective:   Physical Exam  Constitutional: She is oriented to person, place, and time. She appears well-developed and well-nourished. No distress.  HENT:  Head: Normocephalic and atraumatic.  Eyes: Conjunctivae and EOM are normal. Pupils are equal, round, and reactive to light.  Neck: Normal range of motion. Neck supple. No thyromegaly present.  Cardiovascular: Normal rate, regular rhythm, normal heart sounds and intact distal pulses.   No murmur heard. Pulmonary/Chest: Effort normal and breath sounds normal. No respiratory distress.  Abdominal: Soft. She exhibits no distension. There is no tenderness.  Musculoskeletal: She exhibits no edema.  Lymphadenopathy:    She has no cervical adenopathy.  Neurological: She is alert and oriented to person, place, and time.  Skin: Skin is warm and dry. There is erythema (over L upper anterior chest will w/ central bite- no induration or sign of infxn).  Psychiatric: She has a normal mood and affect. Her behavior is normal.          Assessment & Plan:

## 2011-11-22 NOTE — Assessment & Plan Note (Signed)
New.  No current signs of infxn.  No need for abx at this time.  Pt to continue prednisone and anti-venom as previously directed.

## 2011-11-29 ENCOUNTER — Telehealth: Payer: Self-pay | Admitting: Family Medicine

## 2011-11-29 NOTE — Telephone Encounter (Signed)
Caller: Jocilynn/Patient; Patient Name: Jessica Diaz; PCP: Sheliah Hatch.; Best Callback Phone Number: [phone]. Patient is calling to see if Dr. Beverely Low can recommend a multivitamin for her to start taking. She has recently been placed on Seasonique and Topamax. Please call the patient back to give her a recommendation.

## 2011-11-29 NOTE — Telephone Encounter (Signed)
Any once daily vitamin would be appropriate- given her young age would start prenatal vitamin (the girls here like the [employer] brand)

## 2011-12-02 ENCOUNTER — Telehealth: Payer: Self-pay | Admitting: Family Medicine

## 2011-12-02 NOTE — Telephone Encounter (Signed)
Caller: Azucena/Patient; Patient Name: Jessica Diaz; PCP: Sheliah Hatch.; Best Callback Phone Number: [phone]. LMP: 11/28/11 after Mirena IUD removed.  BCP/Seasonique. Called back for message from MD re multivitamin.  Per Epic message, informed Dr Beverely Low suggested she take a daily prenatal vitamin; staff like [employer] brand.  Reports continues to have cramping and heavy vaginal bleeding after difficult removal of imbedded Mirena IUD 11/28/11.  Using 1 maxi pad q 2.5 hours with quarter sized clots.  Back pain steady. Afebrile.  Has been abstinent since 11/07/11 (3 weeks) before IUD removal.  Started Seasonique 11/29/11 with no decrease in flow. Advised to see MD within 4 hours for moderate bleeding other than during ususal menstrual cycle per Abnormal Premenopausal Vaginal Bleeding guideline.  No appointments remain for 12/02/11 at [facility] or with NP at [facility] office.  NP at [facility] is not availble.  Information noted and sent to [facility] pool for immediate call back.

## 2011-12-02 NOTE — Telephone Encounter (Signed)
Discuss with patient who states that she is currently in the process of changing GYN due to issue with IUD and during her delivery. Pt notes that bleeding and cramping is not a issue just when she spoke to nurse she inquired about her current method of birth control so that why she advise her of the current situation. Pt advise if she still having concerns to f/u with our office or a local urgent care due to not living in [place]. Pt ok and if symptom increase will f/u with someone.

## 2011-12-02 NOTE — Telephone Encounter (Signed)
msg left to call the office     KP 

## 2011-12-02 NOTE — Telephone Encounter (Signed)
Pt needs to call GYN who performed procedure

## 2011-12-03 NOTE — Telephone Encounter (Signed)
Noted  

## 2011-12-03 NOTE — Telephone Encounter (Signed)
.  left message to have patient return my call.  

## 2011-12-10 NOTE — Telephone Encounter (Signed)
Several attempts have been made to contact pt with no resolve, sent letter to pt address noted in chart with results/instructions/prescriptions. Advised pt to call office if any questions or concerns per letter.  

## 2011-12-13 ENCOUNTER — Ambulatory Visit: Payer: No Typology Code available for payment source | Admitting: Endocrinology

## 2012-03-23 ENCOUNTER — Other Ambulatory Visit: Payer: Self-pay | Admitting: Obstetrics & Gynecology

## 2012-03-23 DIAGNOSIS — R102 Pelvic and perineal pain: Secondary | ICD-10-CM

## 2012-03-24 ENCOUNTER — Ambulatory Visit
Admission: RE | Admit: 2012-03-24 | Discharge: 2012-03-24 | Disposition: A | Discharge status: Home / Self Care | Payer: No Typology Code available for payment source | Source: Ambulatory Visit | Attending: Obstetrics & Gynecology | Admitting: Obstetrics & Gynecology

## 2012-03-24 DIAGNOSIS — R102 Pelvic and perineal pain: Secondary | ICD-10-CM

## 2012-04-15 DIAGNOSIS — Z87442 Personal history of urinary calculi: Secondary | ICD-10-CM

## 2012-04-15 DIAGNOSIS — Z86718 Personal history of other venous thrombosis and embolism: Secondary | ICD-10-CM

## 2012-04-15 HISTORY — DX: Personal history of urinary calculi: Z87.442

## 2012-04-15 HISTORY — DX: Personal history of other venous thrombosis and embolism: Z86.718

## 2012-05-25 ENCOUNTER — Ambulatory Visit (INDEPENDENT_AMBULATORY_CARE_PROVIDER_SITE_OTHER): Payer: No Typology Code available for payment source | Admitting: Family Medicine

## 2012-05-25 ENCOUNTER — Encounter: Payer: Self-pay | Admitting: Family Medicine

## 2012-05-25 VITALS — BP 116/70 | HR 80 | Temp 98.4°F | Ht 65.5 in | Wt 139.0 lb

## 2012-05-25 DIAGNOSIS — J020 Streptococcal pharyngitis: Secondary | ICD-10-CM

## 2012-05-25 DIAGNOSIS — J029 Acute pharyngitis, unspecified: Secondary | ICD-10-CM

## 2012-05-25 LAB — POCT RAPID STREP A (OFFICE): Rapid Strep A Screen: NEGATIVE

## 2012-05-25 NOTE — Patient Instructions (Addendum)
Your strep test was negative This all appears to be viral and should improve w/ time Drink plenty of fluids Tylenol or ibuprofen as needed for pain or fever Add Mucinex DM to thin congestion and help w/ cough REST! Hang in there!!!

## 2012-05-25 NOTE — Assessment & Plan Note (Signed)
Rapid strep negative.  No obvious bacterial infxn so no need for abx.  Reviewed supportive care and red flags that should prompt return.  Pt expressed understanding and is in agreement w/ plan.

## 2012-05-25 NOTE — Progress Notes (Signed)
  Subjective:    Patient ID: Jessica Diaz, female    DOB: 06/22/1990, 22 y.o.   MRN: [id number]  HPI Sore throat- son dx'd w/ strep on Thursday, pt started feeling poorly on Friday.  + sore throat, PND, productive cough- 'mucousy green'.  Saturday AM had low grade fever.  No N/V/D.  L ear pain.  No facial pain/pressure.   Review of Systems For ROS see HPI     Objective:   Physical Exam  Vitals reviewed. Constitutional: She appears well-developed and well-nourished. No distress.  HENT:  Head: Normocephalic and atraumatic.  TMs normal bilaterally Mild nasal congestion Throat w/out erythema, edema, or exudate  Eyes: Conjunctivae and EOM are normal. Pupils are equal, round, and reactive to light.  Neck: Normal range of motion. Neck supple.  Cardiovascular: Normal rate, regular rhythm, normal heart sounds and intact distal pulses.   No murmur heard. Pulmonary/Chest: Effort normal and breath sounds normal. No respiratory distress. She has no wheezes.  + dry cough  Lymphadenopathy:    She has no cervical adenopathy.          Assessment & Plan:

## 2012-05-30 ENCOUNTER — Other Ambulatory Visit: Payer: Self-pay

## 2012-07-29 ENCOUNTER — Ambulatory Visit (INDEPENDENT_AMBULATORY_CARE_PROVIDER_SITE_OTHER): Payer: No Typology Code available for payment source | Admitting: Family Medicine

## 2012-07-29 ENCOUNTER — Encounter: Payer: Self-pay | Admitting: Family Medicine

## 2012-07-29 VITALS — BP 102/68 | HR 83 | Temp 98.5°F | Wt 134.4 lb

## 2012-07-29 DIAGNOSIS — J069 Acute upper respiratory infection, unspecified: Secondary | ICD-10-CM

## 2012-07-29 DIAGNOSIS — R059 Cough, unspecified: Secondary | ICD-10-CM

## 2012-07-29 DIAGNOSIS — R05 Cough: Secondary | ICD-10-CM

## 2012-07-29 MED ORDER — LORATADINE 10 MG PO TABS: 10.0000 mg | ORAL_TABLET | Freq: Every day | ORAL | Status: DC

## 2012-07-29 MED ORDER — PROMETHAZINE-DM 6.25-15 MG/5ML PO SYRP: ORAL_SOLUTION | ORAL | Status: DC

## 2012-07-29 MED ORDER — AZELASTINE-FLUTICASONE 137-50 MCG/ACT NA SUSP: 1.0000 | Freq: Two times a day (BID) | NASAL | Status: DC

## 2012-07-29 NOTE — Progress Notes (Signed)
  Subjective:     Jessica Diaz is a 22 y.o. female who presents for evaluation of symptoms of a URI. Symptoms include achiness, congestion, nasal congestion, no  fever, post nasal drip, sneezing and sore throat. Onset of symptoms was 3 days ago, and has been gradually worsening since that time. Treatment to date: antihistamines.  The following portions of the patient's history were reviewed and updated as appropriate: allergies, current medications, past family history, past medical history, past social history, past surgical history and problem list.  Review of Systems Pertinent items are noted in HPI.   Objective:    BP 102/68  Pulse 83  Temp(Src) 98.5 F (36.9 C) (Oral)  Wt 134 lb 6.4 oz (60.963 kg)  BMI 22.02 kg/m2  SpO2 96%  Breastfeeding? No General appearance: alert, cooperative, appears stated age and no distress Ears: normal TM's and external ear canals both ears Nose: yellow discharge, moderate congestion, turbinates red, swollen, no sinus tenderness Throat: abnormal findings: mild oropharyngeal erythema and + PND Neck: no adenopathy, supple, symmetrical, trachea midline and thyroid not enlarged, symmetric, no tenderness/mass/nodules Lungs: clear to auscultation bilaterally Heart: S1, S2 normal   Assessment:    viral upper respiratory illness   Plan:    Discussed diagnosis and treatment of URI. Suggested symptomatic OTC remedies. Nasal saline spray for congestion. Nasal steroids per orders. Follow up as needed.

## 2012-07-29 NOTE — Patient Instructions (Addendum)

## 2012-09-25 ENCOUNTER — Encounter: Payer: Self-pay | Admitting: Family Medicine

## 2012-09-25 ENCOUNTER — Ambulatory Visit (INDEPENDENT_AMBULATORY_CARE_PROVIDER_SITE_OTHER): Payer: No Typology Code available for payment source | Admitting: Family Medicine

## 2012-09-25 VITALS — BP 120/80 | HR 72 | Temp 98.2°F | Ht 65.5 in | Wt 135.6 lb

## 2012-09-25 DIAGNOSIS — L0591 Pilonidal cyst without abscess: Secondary | ICD-10-CM | POA: Insufficient documentation

## 2012-09-25 MED ORDER — CEPHALEXIN 500 MG PO CAPS: 500.0000 mg | ORAL_CAPSULE | Freq: Two times a day (BID) | ORAL | Status: DC

## 2012-09-25 NOTE — Patient Instructions (Addendum)
This is a pilonidal cyst Start the Keflex twice daily- take w/ food Hot compresses or baths for pain relief We'll call you with your surgery appt to discuss drainage Call with any questions or concerns Hang in there!

## 2012-09-25 NOTE — Assessment & Plan Note (Signed)
New.  No evidence of cellulitis or superficial infxn but unable to assess the deep tissues.  Start abx.  Refer to surgery for definitive tx.  Reviewed supportive care and red flags that should prompt return.  Pt expressed understanding and is in agreement w/ plan.

## 2012-09-25 NOTE — Progress Notes (Signed)
  Subjective:    Patient ID: Jessica Diaz, female    DOB: [birth year], 22 y.o.   MRN: [id number]  HPI Painful to sit on tailbone.  sxs started on Tuesday.  Increased pain w/ activity or pressure.  Feels lump on tailbone.  No injury or fall.  Hx of tailbone injury when young.  No skin rash or drainage.  No fevers/chills.  Pain doesn't radiate.   Review of Systems For ROS see HPI     Objective:   Physical Exam  Vitals reviewed. Constitutional: She appears well-developed and well-nourished. No distress.  Skin: Skin is warm and dry. No rash noted.  Small central pore at superior gluteal cleft w/ underlying induration.  + TTP.  No purulent drainage, no overlying erythema.          Assessment & Plan:

## 2012-10-12 ENCOUNTER — Encounter (INDEPENDENT_AMBULATORY_CARE_PROVIDER_SITE_OTHER): Payer: Self-pay | Admitting: General Surgery

## 2012-10-12 ENCOUNTER — Ambulatory Visit (INDEPENDENT_AMBULATORY_CARE_PROVIDER_SITE_OTHER): Payer: No Typology Code available for payment source | Admitting: General Surgery

## 2012-10-12 VITALS — BP 100/72 | HR 70 | Resp 18 | Ht 64.0 in | Wt 132.0 lb

## 2012-10-12 DIAGNOSIS — L0591 Pilonidal cyst without abscess: Secondary | ICD-10-CM

## 2012-10-12 NOTE — Patient Instructions (Signed)
May return to normal activities Call us if it flares up again

## 2012-10-13 NOTE — Progress Notes (Signed)
Subjective:     Patient ID: Jessica Diaz, female   DOB: 16-Aug-1990, 22 y.o.   MRN: [id number]  HPI We are asked to see the patient in consultation by Dr. Beverely Low to evaluate her for a pilonidal cyst. The patient is a 22 year old white female who first noticed some discomfort in her gluteal cleft about 3 weeks ago. She thought she felt a knot in this location. She brought this to the attention of her medical doctor who placed her on antibiotics. The patient states there was never any redness or drainage from the area. The discomfort resolved and she has been feeling completely normal for the last couple weeks.  Review of Systems  Constitutional: Negative.   HENT: Negative.   Eyes: Negative.   Respiratory: Negative.   Cardiovascular: Negative.   Gastrointestinal: Negative.   Endocrine: Negative.   Genitourinary: Negative.   Musculoskeletal: Negative.   Skin: Negative.   Allergic/Immunologic: Negative.   Neurological: Negative.   Hematological: Negative.   Psychiatric/Behavioral: Negative.        Objective:   Physical Exam  Constitutional: She is oriented to person, place, and time. She appears well-developed and well-nourished.  HENT:  Head: Normocephalic and atraumatic.  Eyes: Conjunctivae and EOM are normal. Pupils are equal, round, and reactive to light.  Neck: Normal range of motion. Neck supple.  Cardiovascular: Normal rate, regular rhythm and normal heart sounds.   Pulmonary/Chest: Effort normal and breath sounds normal.  Abdominal: Soft. Bowel sounds are normal.  Genitourinary:  There is one small punctate opening in the skin in the gluteal cleft area. There is no palpable fullness or induration or redness to the area. skin otherwise looks healthy.  Musculoskeletal: Normal range of motion.  Neurological: She is alert and oriented to person, place, and time.  Skin: Skin is warm and dry.  Psychiatric: She has a normal mood and affect. Her behavior is normal.        Assessment:     The patient may have had a pilonidal cyst that flared up in her gluteal cleft area but she is actually doing very well now. With no other physical findings except for the one little punctate opening I have given her the option of having this surgically opened versus observation. She would prefer to try observation at this point. She agrees to call us immediately if that seems to be flaring up again.    Plan:     At this point she will call her for any problems. Otherwise we will see her back on a when necessary basis

## 2013-01-10 ENCOUNTER — Encounter (HOSPITAL_COMMUNITY): Payer: Self-pay | Admitting: *Deleted

## 2013-01-10 ENCOUNTER — Emergency Department (HOSPITAL_COMMUNITY)
Admission: EM | Admit: 2013-01-10 | Discharge: 2013-01-11 | Disposition: A | Discharge status: Home / Self Care | Payer: BC Managed Care – PPO | Attending: Emergency Medicine | Admitting: Emergency Medicine

## 2013-01-10 ENCOUNTER — Inpatient Hospital Stay (HOSPITAL_COMMUNITY)
Admission: AD | Admit: 2013-01-10 | Discharge: 2013-01-10 | Disposition: A | Discharge status: Home / Self Care | Payer: BC Managed Care – PPO | Source: Ambulatory Visit | Attending: Obstetrics and Gynecology | Admitting: Obstetrics and Gynecology

## 2013-01-10 ENCOUNTER — Inpatient Hospital Stay (HOSPITAL_COMMUNITY): Payer: BC Managed Care – PPO

## 2013-01-10 DIAGNOSIS — N201 Calculus of ureter: Secondary | ICD-10-CM | POA: Insufficient documentation

## 2013-01-10 DIAGNOSIS — R109 Unspecified abdominal pain: Secondary | ICD-10-CM | POA: Diagnosis present

## 2013-01-10 DIAGNOSIS — N390 Urinary tract infection, site not specified: Secondary | ICD-10-CM | POA: Insufficient documentation

## 2013-01-10 DIAGNOSIS — N200 Calculus of kidney: Secondary | ICD-10-CM | POA: Insufficient documentation

## 2013-01-10 DIAGNOSIS — R339 Retention of urine, unspecified: Secondary | ICD-10-CM | POA: Insufficient documentation

## 2013-01-10 DIAGNOSIS — Z792 Long term (current) use of antibiotics: Secondary | ICD-10-CM | POA: Diagnosis not present

## 2013-01-10 DIAGNOSIS — N1330 Unspecified hydronephrosis: Secondary | ICD-10-CM | POA: Insufficient documentation

## 2013-01-10 DIAGNOSIS — Z79899 Other long term (current) drug therapy: Secondary | ICD-10-CM | POA: Diagnosis not present

## 2013-01-10 DIAGNOSIS — Z3202 Encounter for pregnancy test, result negative: Secondary | ICD-10-CM | POA: Insufficient documentation

## 2013-01-10 HISTORY — DX: Endometriosis, unspecified: N80.9

## 2013-01-10 HISTORY — DX: Urinary tract infection, site not specified: N39.0

## 2013-01-10 LAB — URINE MICROSCOPIC-ADD ON

## 2013-01-10 LAB — URINALYSIS, ROUTINE W REFLEX MICROSCOPIC
Bilirubin Urine: NEGATIVE
Glucose, UA: NEGATIVE mg/dL
Specific Gravity, Urine: 1.025 (ref 1.005–1.030)
Urobilinogen, UA: 0.2 mg/dL (ref 0.0–1.0)
pH: 6 (ref 5.0–8.0)

## 2013-01-10 MED ORDER — SULFAMETHOXAZOLE-TMP DS 800-160 MG PO TABS: 1.0000 | ORAL_TABLET | Freq: Two times a day (BID) | ORAL | Status: DC

## 2013-01-10 MED ORDER — KETOROLAC TROMETHAMINE 60 MG/2ML IM SOLN
60.0000 mg | Freq: Once | INTRAMUSCULAR | Status: AC
  Administered 2013-01-10: 60 mg via INTRAMUSCULAR
  Filled 2013-01-10: qty 2

## 2013-01-10 MED ORDER — PHENAZOPYRIDINE HCL 100 MG PO TABS: 100.0000 mg | ORAL_TABLET | Freq: Three times a day (TID) | ORAL | Status: DC | PRN

## 2013-01-10 NOTE — MAU Provider Note (Signed)
History     CSN: [id number]  Arrival date and time: 01/10/13 4098   None     Chief Complaint  Patient presents with  . Back Pain  . Urinary Retention   HPI This is a 22 y.o. female who presents with c/o inability to void since 0630.  States feels need to go and sensation of needing to push, but only a trickle comes out. Also has right flank pain. No fever. States has had a flare up of her endometriosis this week. History of kidney stones.  RN note: Patient presents complaining of inability to urinate since 6:30 today followed by pain in right lower side 30 minutes later.      OB History   Grav Para Term Preterm Abortions TAB SAB Ect Mult Living   1 1 1              No past medical history on file.  No past surgical history on file.  No family history on file.  History  Substance Use Topics  . Smoking status: Never Smoker   . Smokeless tobacco: Not on file  . Alcohol Use: No    Allergies:  Allergies  Allergen Reactions  . Codeine     Makes blood pressure rise   . Decongestant [Oxymetazoline]     Makes blood pressure drop   . Percocet [Oxycodone-Acetaminophen]     Hallucinate     Prescriptions prior to admission  Medication Sig Dispense Refill  . Biotin 1000 MCG tablet Take 1,000 mcg by mouth 3 (three) times daily.      Marland Kitchen loratadine (CLARITIN) 10 MG tablet Take 1 tablet (10 mg total) by mouth daily.  30 tablet  11  . norethindrone-ethinyl estradiol (JUNEL FE,GILDESS FE,LOESTRIN FE) 1-20 MG-MCG tablet Take 1 tablet by mouth daily.      . Prenatal Vit-Fe Fumarate-FA (MULTIVITAMIN-PRENATAL) 27-0.8 MG TABS Take 1 tablet by mouth daily.        Review of Systems  Constitutional: Negative for fever, chills and malaise/fatigue.  Gastrointestinal: Positive for abdominal pain. Negative for nausea, vomiting, diarrhea and constipation.  Genitourinary: Positive for frequency and flank pain. Negative for dysuria.  Musculoskeletal: Negative for myalgias.   Neurological: Negative for dizziness.   Physical Exam   Blood pressure 113/70, pulse 67, temperature 97.8 F (36.6 C), temperature source Oral, resp. rate 16, height 5\' 5"  (1.651 m), weight 62.71 kg (138 lb 4 oz), last menstrual period 12/20/2012.  Physical Exam  Constitutional: She is oriented to person, place, and time. She appears well-developed and well-nourished. No distress.  HENT:  Head: Normocephalic.  Cardiovascular: Normal rate.   Respiratory: Effort normal.  GI: Soft. She exhibits no distension and no mass. There is tenderness (mild lower abdomen). There is no rebound and no guarding.  + CVAT on right   Musculoskeletal: Normal range of motion.  Neurological: She is alert and oriented to person, place, and time.  Skin: Skin is warm and dry.  Psychiatric: She has a normal mood and affect.    MAU Course  Procedures  MDM Results for orders placed during the hospital encounter of 01/10/13 (from the past 72 hour(s))  URINALYSIS, ROUTINE W REFLEX MICROSCOPIC     Status: Abnormal   Collection Time    01/10/13 10:00 AM      Result Value Range   Color, Urine YELLOW  YELLOW   APPearance CLEAR  CLEAR   Specific Gravity, Urine 1.025  1.005 - 1.030   pH 6.0  5.0 -  8.0   Glucose, UA NEGATIVE  NEGATIVE mg/dL   Hgb urine dipstick MODERATE (*) NEGATIVE   Bilirubin Urine NEGATIVE  NEGATIVE   Ketones, ur NEGATIVE  NEGATIVE mg/dL   Protein, ur NEGATIVE  NEGATIVE mg/dL   Urobilinogen, UA 0.2  0.0 - 1.0 mg/dL   Nitrite NEGATIVE  NEGATIVE   Leukocytes, UA TRACE (*) NEGATIVE  URINE MICROSCOPIC-ADD ON     Status: Abnormal   Collection Time    01/10/13 10:00 AM      Result Value Range   Squamous Epithelial / LPF FEW (*) RARE   WBC, UA 3-6  <3 WBC/hpf   RBC / HPF 7-10  <3 RBC/hpf   Bacteria, UA FEW (*) RARE   Urine-Other MUCOUS PRESENT    POCT PREGNANCY, URINE     Status: None   Collection Time    01/10/13 10:07 AM      Result Value Range   Preg Test, Ur NEGATIVE  NEGATIVE    Comment:            THE SENSITIVITY OF THIS     METHODOLOGY IS >24 mIU/mL   Will check renal ultrasound US Renal  01/10/2013   CLINICAL DATA:  Right flank and lower quadrant pain, urinary retention, history kidney stones, endometriosis, initial encounter  EXAM: RENAL/URINARY TRACT ULTRASOUND COMPLETE  COMPARISON:  07/11/2010  FINDINGS: Right Kidney  Length: 11.7 cm length. Normal cortical thickness and echogenicity. Mild right hydronephrosis identified, slightly less severe than was seen on the previous exam. Several small echogenic foci are seen at the lower pole, cannot exclude nonobstructing calculi.  Left Kidney  Length: 9.9 cm length. Normal cortical thickness and echogenicity. No mass, hydronephrosis, or shadowing calcification.  Bladder:  Normal appearance. Ureteral jets were not visualized.    IMPRESSION: Mild right hydronephrosis.  Cannot exclude tiny nonobstructing calculi at lower pole right kidney.     Electronically Signed   By: Ulyses Southward M.D.   On: 01/10/2013 11:45    Assessment and Plan  A:  Urinary discomfort      Possible right kidney stones, nonobstructing      Mild right hydronephrosis  P:  Discussed findings      Will treat for UTI      Rx Septra DS and pyridium      Force fluids      Call primary MD in am to schedule urology consult  [facility] 01/10/2013, 10:21 AM

## 2013-01-10 NOTE — MAU Note (Signed)
Unable to scan patient's bladder in the MAU due to equipment failure.

## 2013-01-10 NOTE — MAU Provider Note (Signed)
Attestation of Attending Supervision of Advanced Practitioner: Evaluation and management procedures were performed by the PA/NP/CNM/OB Fellow under my supervision/collaboration. Chart reviewed and agree with management and plan.  Tilda Burrow 01/10/2013 6:43 PM

## 2013-01-10 NOTE — MAU Note (Signed)
Patient presents complaining of inability to urinate since 6:30 today followed by pain in right lower side 30 minutes later.

## 2013-01-10 NOTE — Progress Notes (Signed)
Patient medicated with Toradol 60mg  IM as ordered and given an emesis bag/cold compress. To Radiology with NT and mother via W/C for renal U/S as ordered.

## 2013-01-11 ENCOUNTER — Emergency Department (HOSPITAL_COMMUNITY): Payer: BC Managed Care – PPO

## 2013-01-11 ENCOUNTER — Encounter (HOSPITAL_COMMUNITY): Payer: Self-pay | Admitting: Emergency Medicine

## 2013-01-11 LAB — URINE MICROSCOPIC-ADD ON

## 2013-01-11 LAB — URINALYSIS, ROUTINE W REFLEX MICROSCOPIC
Bilirubin Urine: NEGATIVE
Glucose, UA: NEGATIVE mg/dL
Hgb urine dipstick: NEGATIVE
Ketones, ur: NEGATIVE mg/dL
Leukocytes, UA: NEGATIVE
Nitrite: POSITIVE — AB
Protein, ur: NEGATIVE mg/dL
pH: 7 (ref 5.0–8.0)

## 2013-01-11 LAB — PREGNANCY, URINE: Preg Test, Ur: NEGATIVE

## 2013-01-11 MED ORDER — HYDROCODONE-ACETAMINOPHEN 5-325 MG PO TABS: 1.0000 | ORAL_TABLET | ORAL | Status: DC | PRN

## 2013-01-11 MED ORDER — CEFTRIAXONE SODIUM 1 G IJ SOLR
1.0000 g | Freq: Once | INTRAMUSCULAR | Status: AC
  Administered 2013-01-11: 1 g via INTRAVENOUS
  Filled 2013-01-11: qty 10

## 2013-01-11 MED ORDER — ONDANSETRON HCL 4 MG/2ML IJ SOLN
4.0000 mg | Freq: Once | INTRAMUSCULAR | Status: AC
  Administered 2013-01-11: 4 mg via INTRAVENOUS
  Filled 2013-01-11: qty 2

## 2013-01-11 MED ORDER — SODIUM CHLORIDE 0.9 % IV BOLUS (SEPSIS)
1000.0000 mL | Freq: Once | INTRAVENOUS | Status: AC
  Administered 2013-01-11: 1000 mL via INTRAVENOUS

## 2013-01-11 MED ORDER — ONDANSETRON HCL 4 MG PO TABS: 4.0000 mg | ORAL_TABLET | Freq: Four times a day (QID) | ORAL | Status: DC

## 2013-01-11 MED ORDER — KETOROLAC TROMETHAMINE 30 MG/ML IJ SOLN
30.0000 mg | Freq: Once | INTRAMUSCULAR | Status: AC
  Administered 2013-01-11: 30 mg via INTRAVENOUS
  Filled 2013-01-11: qty 1

## 2013-01-11 MED ORDER — TAMSULOSIN HCL 0.4 MG PO CAPS: 0.4000 mg | ORAL_CAPSULE | Freq: Every day | ORAL | Status: DC

## 2013-01-11 MED ORDER — HYDROMORPHONE HCL PF 1 MG/ML IJ SOLN
1.0000 mg | Freq: Once | INTRAMUSCULAR | Status: AC
  Administered 2013-01-11: 1 mg via INTRAVENOUS
  Filled 2013-01-11: qty 1

## 2013-01-11 MED ORDER — PROMETHAZINE HCL 25 MG/ML IJ SOLN
25.0000 mg | Freq: Once | INTRAMUSCULAR | Status: AC
  Administered 2013-01-11: 25 mg via INTRAVENOUS
  Filled 2013-01-11: qty 1

## 2013-01-11 NOTE — ED Provider Notes (Signed)
CSN: [id number]     Arrival date & time 01/10/13  2358 History   First MD Initiated Contact with Patient 01/11/13 0031     Chief Complaint  Patient presents with  . Flank Pain  . Abdominal Pain   (Consider location/radiation/quality/duration/timing/severity/associated sxs/prior Treatment) HPI History provided by the patient. Right flank pain, sharp and severe radiates to right lower quadrant abdomen. Onset yesterday was evaluated at [facility] hospital with ultrasound and urinalysis and told that she may have a kidney stone. She was discharged with antibiotics for urinary tract infection. She was also given Pyridium and has orange tinted urine but did not notice any hematuria. Pain worse today. Has nausea but no vomiting. No fevers or chills. Patient worried this is a kidney stone. She also has history of endometriosis but does not believe this pain represents the same.  Past Medical History  Diagnosis Date  . Endometriosis 2008  . Kidney stones   . UTI (lower urinary tract infection)    Past Surgical History  Procedure Laterality Date  . Left knee surgery Left 2007   No family history on file. History  Substance Use Topics  . Smoking status: Never Smoker   . Smokeless tobacco: Not on file  . Alcohol Use: No   OB History   Grav Para Term Preterm Abortions TAB SAB Ect Mult Living   1 1 1       1      Review of Systems  Constitutional: Negative for fever and chills.  HENT: Negative for neck pain and neck stiffness.   Eyes: Negative for pain.  Respiratory: Negative for shortness of breath.   Cardiovascular: Negative for chest pain.  Gastrointestinal: Positive for nausea and abdominal pain. Negative for vomiting.  Genitourinary: Positive for flank pain. Negative for dysuria.  Musculoskeletal: Negative for back pain.  Skin: Negative for rash.  Neurological: Negative for headaches.  All other systems reviewed and are negative.    Allergies  Codeine; Decongestant; and  Percocet  Home Medications   Current Outpatient Rx  Name  Route  Sig  Dispense  Refill  . Biotin 1000 MCG tablet   Oral   Take 1,000 mcg by mouth 3 (three) times daily.         Marland Kitchen loratadine (CLARITIN) 10 MG tablet   Oral   Take 1 tablet (10 mg total) by mouth daily.   30 tablet   11   . norethindrone-ethinyl estradiol (JUNEL FE,GILDESS FE,LOESTRIN FE) 1-20 MG-MCG tablet   Oral   Take 1 tablet by mouth daily.         . phenazopyridine (PYRIDIUM) 100 MG tablet   Oral   Take 1 tablet (100 mg total) by mouth 3 (three) times daily as needed for pain.   12 tablet   0   . Prenatal Vit-Fe Fumarate-FA (PRENATAL MULTIVITAMIN) TABS tablet   Oral   Take 1 tablet by mouth daily at 12 noon.         . sulfamethoxazole-trimethoprim (BACTRIM DS) 800-160 MG per tablet   Oral   Take 1 tablet by mouth 2 (two) times daily.          BP 134/101  Pulse 87  Temp(Src) 98 F (36.7 C) (Oral)  Resp 22  SpO2 99%  LMP 12/20/2012 Physical Exam  Constitutional: She is oriented to person, place, and time. She appears well-developed and well-nourished.  HENT:  Head: Normocephalic and atraumatic.  Eyes: EOM are normal. Pupils are equal, round, and reactive to light.  No scleral icterus.  Neck: Neck supple.  Cardiovascular: Normal rate, regular rhythm and intact distal pulses.   Pulmonary/Chest: Effort normal and breath sounds normal. No respiratory distress.  Abdominal: Soft. Bowel sounds are normal. She exhibits no distension.  Mild right CVA tenderness. Right flank tenderness. No acute abdomen. Negative Murphy sign.   Musculoskeletal: Normal range of motion. She exhibits no edema.  Neurological: She is alert and oriented to person, place, and time.  Skin: Skin is warm and dry.    ED Course  Procedures (including critical care time) Labs Review Labs Reviewed  PREGNANCY, URINE  URINALYSIS, ROUTINE W REFLEX MICROSCOPIC   Imaging Review Ct Abdomen Pelvis Wo Contrast  01/11/2013    CLINICAL DATA:  Right flank pain.  EXAM: CT ABDOMEN AND PELVIS WITHOUT CONTRAST  TECHNIQUE: Multidetector CT imaging of the abdomen and pelvis was performed following the standard protocol without intravenous contrast.  COMPARISON:  Ultrasound 01/10/2013  FINDINGS: Lung bases are clear. No effusions. Heart is normal size.  There is moderate right hydronephrosis and hydroureter. 5 mm stone at the right UVJ. There are also layering stones distally in the dilated right ureter (likely 2). The largest has a cross-sectional diameter of 4 mm.  Non obstructing stones in the upper pole of the right kidney. No hydronephrosis on the left.  Liver, gallbladder, spleen, pancreas, adrenals and stomach are unremarkable. Appendix is visualized and is normal. Uterus and adnexa have an unremarkable unenhanced appearance. Large and small bowel decompressed. Aorta is normal caliber.  No acute bony abnormality.  IMPRESSION: Multiple distal right ureteral stones (likely 3) with the largest 5 mm at the right ureterovesical junction. This causes moderate right hydronephrosis.  Right nephrolithiasis.   Electronically Signed   By: Charlett Nose M.D.   On: 01/11/2013 02:11   US Renal  01/10/2013   CLINICAL DATA:  Right flank and lower quadrant pain, urinary retention, history kidney stones, endometriosis, initial encounter  EXAM: RENAL/URINARY TRACT ULTRASOUND COMPLETE  COMPARISON:  07/11/2010  FINDINGS: Right Kidney  Length: 11.7 cm length. Normal cortical thickness and echogenicity. Mild right hydronephrosis identified, slightly less severe than was seen on the previous exam. Several small echogenic foci are seen at the lower pole, cannot exclude nonobstructing calculi.  Left Kidney  Length: 9.9 cm length. Normal cortical thickness and echogenicity. No mass, hydronephrosis, or shadowing calcification.  Bladder:  Normal appearance. Ureteral jets were not visualized.  IMPRESSION: Mild right hydronephrosis.  Cannot exclude tiny nonobstructing  calculi at lower pole right kidney.   Electronically Signed   By: Ulyses Southward M.D.   On: 01/10/2013 11:45   IV fluids. IV dilaudid. IV Zofran. IV Rocephin Previous records reviewed from yesterday with ultrasound report as above  2:47 AM having 6/10 pain with nausea after Dilaudid and Toradol. Repeat IV Dilaudid and urology consulted.   2:59 AM d/w Urology, Dr Marlou Porch as above, he recommends OK for discharge home, Flomax and continue ABx f/u Urology in clinic.   Pain improving, no emesis in ED, afebrile.  Plan discharge home with kidney stone precautions verbalized as understood. Prescription for Flomax, hydrocodone, Zofran provided. All questions answered. Plan follow up urology MDM  Diagnosis: Right ureterolithiasis, UTI.   CT scan obtained and reviewed. Nit positive urinalysis as above. Urine culture pending. Patient will continue her Bactrim as prescribed.  Symptomatically improved with IV narcotics. IV fluids. Previous records, vital signs and nursing notes reviewed  Sunnie Nielsen, MD 01/11/13 [phone]

## 2013-01-11 NOTE — ED Notes (Addendum)
Pt. reports persistent right flank / right abdominal pain seen at [facility] ER this morning diagnosed with UTI / Non obstructing kidney stone , prescribed with Pyridium and Bactrim DS.

## 2013-01-11 NOTE — ED Notes (Signed)
Pt states she will attempt to void.  

## 2013-01-11 NOTE — ED Notes (Signed)
Patient is resting comfortably. 

## 2013-01-11 NOTE — ED Notes (Signed)
Family at bedside. 

## 2013-01-12 ENCOUNTER — Other Ambulatory Visit: Payer: Self-pay | Admitting: Urology

## 2013-01-12 ENCOUNTER — Ambulatory Visit (HOSPITAL_COMMUNITY): Payer: BC Managed Care – PPO | Admitting: Anesthesiology

## 2013-01-12 ENCOUNTER — Encounter (HOSPITAL_COMMUNITY): Payer: Self-pay | Admitting: Anesthesiology

## 2013-01-12 ENCOUNTER — Ambulatory Visit (HOSPITAL_COMMUNITY)
Admission: RE | Admit: 2013-01-12 | Discharge: 2013-01-12 | Disposition: A | Discharge status: Home / Self Care | Payer: BC Managed Care – PPO | Source: Ambulatory Visit | Attending: Urology | Admitting: Urology

## 2013-01-12 ENCOUNTER — Encounter (HOSPITAL_COMMUNITY): Payer: Self-pay | Admitting: *Deleted

## 2013-01-12 ENCOUNTER — Encounter (HOSPITAL_COMMUNITY)
Admission: RE | Disposition: A | Discharge status: Home / Self Care | Payer: Self-pay | Source: Ambulatory Visit | Attending: Urology

## 2013-01-12 DIAGNOSIS — Z87442 Personal history of urinary calculi: Secondary | ICD-10-CM | POA: Insufficient documentation

## 2013-01-12 DIAGNOSIS — N200 Calculus of kidney: Secondary | ICD-10-CM | POA: Insufficient documentation

## 2013-01-12 DIAGNOSIS — N809 Endometriosis, unspecified: Secondary | ICD-10-CM | POA: Insufficient documentation

## 2013-01-12 DIAGNOSIS — Z87440 Personal history of urinary (tract) infections: Secondary | ICD-10-CM | POA: Insufficient documentation

## 2013-01-12 DIAGNOSIS — N201 Calculus of ureter: Secondary | ICD-10-CM

## 2013-01-12 HISTORY — DX: Other specified postprocedural states: Z98.890

## 2013-01-12 HISTORY — PX: CYSTOSCOPY WITH RETROGRADE PYELOGRAM, URETEROSCOPY AND STENT PLACEMENT: SHX5789

## 2013-01-12 HISTORY — DX: Nausea with vomiting, unspecified: R11.2

## 2013-01-12 HISTORY — DX: Family history of other specified conditions: Z84.89

## 2013-01-12 LAB — URINE CULTURE
Colony Count: NO GROWTH
Culture: NO GROWTH

## 2013-01-12 SURGERY — CYSTOURETEROSCOPY, WITH RETROGRADE PYELOGRAM AND STENT INSERTION
Anesthesia: General | Laterality: Right | Wound class: Clean Contaminated

## 2013-01-12 MED ORDER — KETOROLAC TROMETHAMINE 30 MG/ML IJ SOLN: 15.0000 mg | Freq: Once | INTRAMUSCULAR | Status: DC | PRN

## 2013-01-12 MED ORDER — CEFAZOLIN SODIUM 1-5 GM-% IV SOLN
1.0000 g | INTRAVENOUS | Status: AC
  Administered 2013-01-12: 2 g via INTRAVENOUS

## 2013-01-12 MED ORDER — LIDOCAINE HCL 2 % EX GEL
CUTANEOUS | Status: AC
  Filled 2013-01-12: qty 10

## 2013-01-12 MED ORDER — LIDOCAINE HCL 2 % EX GEL
CUTANEOUS | Status: DC | PRN
  Administered 2013-01-12: 1 via URETHRAL

## 2013-01-12 MED ORDER — OXYBUTYNIN CHLORIDE ER 10 MG PO TB24: 10.0000 mg | ORAL_TABLET | Freq: Every day | ORAL | Status: DC

## 2013-01-12 MED ORDER — FENTANYL CITRATE 0.05 MG/ML IJ SOLN
INTRAMUSCULAR | Status: DC | PRN
  Administered 2013-01-12 (×5): 50 ug via INTRAVENOUS

## 2013-01-12 MED ORDER — DIPHENHYDRAMINE HCL 50 MG/ML IJ SOLN
12.5000 mg | Freq: Once | INTRAMUSCULAR | Status: AC
  Administered 2013-01-12: 12.5 mg via INTRAVENOUS

## 2013-01-12 MED ORDER — LACTATED RINGERS IV SOLN
INTRAVENOUS | Status: DC
  Administered 2013-01-12 (×2): via INTRAVENOUS

## 2013-01-12 MED ORDER — CEFAZOLIN SODIUM-DEXTROSE 2-3 GM-% IV SOLR
INTRAVENOUS | Status: AC
  Filled 2013-01-12: qty 50

## 2013-01-12 MED ORDER — FENTANYL CITRATE 0.05 MG/ML IJ SOLN
INTRAMUSCULAR | Status: AC
  Filled 2013-01-12: qty 2

## 2013-01-12 MED ORDER — ONDANSETRON HCL 4 MG/2ML IJ SOLN
INTRAMUSCULAR | Status: DC | PRN
  Administered 2013-01-12: 4 mg via INTRAVENOUS

## 2013-01-12 MED ORDER — PROMETHAZINE HCL 25 MG/ML IJ SOLN: 6.2500 mg | INTRAMUSCULAR | Status: DC | PRN

## 2013-01-12 MED ORDER — SCOPOLAMINE 1 MG/3DAYS TD PT72
1.0000 | MEDICATED_PATCH | TRANSDERMAL | Status: DC
  Administered 2013-01-12: 1.5 mg via TRANSDERMAL
  Filled 2013-01-12: qty 1

## 2013-01-12 MED ORDER — DEXAMETHASONE SODIUM PHOSPHATE 10 MG/ML IJ SOLN
INTRAMUSCULAR | Status: DC | PRN
  Administered 2013-01-12: 10 mg via INTRAVENOUS

## 2013-01-12 MED ORDER — SODIUM CHLORIDE 0.9 % IR SOLN
Status: DC | PRN
  Administered 2013-01-12: 1000 mL

## 2013-01-12 MED ORDER — MIDAZOLAM HCL 5 MG/5ML IJ SOLN
INTRAMUSCULAR | Status: DC | PRN
  Administered 2013-01-12: 2 mg via INTRAVENOUS

## 2013-01-12 MED ORDER — FENTANYL CITRATE 0.05 MG/ML IJ SOLN
25.0000 ug | INTRAMUSCULAR | Status: DC | PRN
  Administered 2013-01-12: 50 ug via INTRAVENOUS

## 2013-01-12 MED ORDER — LIDOCAINE HCL (CARDIAC) 20 MG/ML IV SOLN
INTRAVENOUS | Status: DC | PRN
  Administered 2013-01-12: 50 mg via INTRAVENOUS

## 2013-01-12 MED ORDER — PROPOFOL 10 MG/ML IV BOLUS
INTRAVENOUS | Status: DC | PRN
  Administered 2013-01-12: 200 mg via INTRAVENOUS

## 2013-01-12 MED ORDER — DIPHENHYDRAMINE HCL 50 MG/ML IJ SOLN
INTRAMUSCULAR | Status: AC
  Filled 2013-01-12: qty 1

## 2013-01-12 MED ORDER — SCOPOLAMINE 1 MG/3DAYS TD PT72
MEDICATED_PATCH | TRANSDERMAL | Status: AC
  Filled 2013-01-12: qty 1

## 2013-01-12 MED ORDER — HYDROCODONE-ACETAMINOPHEN 5-325 MG PO TABS: 1.0000 | ORAL_TABLET | Freq: Four times a day (QID) | ORAL | Status: DC | PRN

## 2013-01-12 SURGICAL SUPPLY — 17 items
BAG URINE DRAINAGE (UROLOGICAL SUPPLIES) IMPLANT
BASKET ZERO TIP NITINOL 2.4FR (BASKET) ×2 IMPLANT
CATH INTERMIT  6FR 70CM (CATHETERS) ×2 IMPLANT
CATH URET 5FR 28IN OPEN ENDED (CATHETERS) IMPLANT
CLOTH BEACON ORANGE TIMEOUT ST (SAFETY) ×2 IMPLANT
DRAPE CAMERA CLOSED 9X96 (DRAPES) ×2 IMPLANT
FIBER LASER FLEXIVA 200 (UROLOGICAL SUPPLIES) IMPLANT
FIBER LASER FLEXIVA 365 (UROLOGICAL SUPPLIES) IMPLANT
GLOVE BIOGEL M STRL SZ7.5 (GLOVE) IMPLANT
GLOVE SURG SS PI 7.5 STRL IVOR (GLOVE) ×2 IMPLANT
GOWN STRL REIN XL XLG (GOWN DISPOSABLE) ×2 IMPLANT
GUIDEWIRE .038 (WIRE) IMPLANT
GUIDEWIRE ANG ZIPWIRE 038X150 (WIRE) IMPLANT
GUIDEWIRE STR DUAL SENSOR (WIRE) ×2 IMPLANT
IV NS IRRIG 3000ML ARTHROMATIC (IV SOLUTION) ×2 IMPLANT
PACK CYSTO (CUSTOM PROCEDURE TRAY) ×2 IMPLANT
SHEATH URET ACCESS 12FR/35CM (UROLOGICAL SUPPLIES) ×2 IMPLANT

## 2013-01-12 NOTE — H&P (Signed)
History of Present Illness   Jessica Diaz presents as a referral from the [facility] Emergency Room. She is 22 years of age and generally enjoys excellent health. She reports being diagnosed with a ureteral calculus approximately 2 years ago when she was pregnant. That stone presumptively did pass spontaneously. Approximately 2 days ago, she had a sudden onset of bladder pressure with urgency to void and significant right-sided flank discomfort, which intensified. She originally went to [facility] where an ultrasound showed mild hydro. They diagnosed with what sounds like pyelonephritis and put her on antibiotic therapy. Her pain markedly intensified and she went to [facility] where CT imaging did reveal multiple distal right ureteral calculi. She does have mild hydronephrosis. Urine culture was negative. She has had a low-grade fever less than 100.0. She has continued to have intense bladder pressure with significant pressure and ongoing frequency. She continues to have quite a bit of right-sided flank and abdominal discomfort and has not been able to be comfortable. She has required pain medicine on a constant basis. She does have a family history of nephrolithiasis in her father and sister.      Past Medical History Problems  1. History of  Endometriosis 617.9 2. History of  Nephrolithiasis V13.01 3. History of  Urinary Tract Infection V13.02  Surgical History Problems  1. History of  Knee Surgery Left 2. History of  Laparoscopy (Diagnostic) 3. History of  Oral Surgery Tooth Extraction  Current Meds 1. Biotin 1000 MCG Oral Tablet; Therapy: (Recorded:30Sep2014) to 2. Birth Control Pill; Therapy: (Recorded:30Sep2014) to 3. Hydrocodone-Acetaminophen 5-325 MG Oral Tablet; Therapy: (Recorded:30Sep2014) to 4. Loratadine 10 MG Oral Tablet; Therapy: (Recorded:30Sep2014) to 5. Ondansetron HCl TABS; Therapy: (Recorded:30Sep2014) to 6. Phenazopyridine HCl 100 MG Oral Tablet; Therapy:  (Recorded:30Sep2014) to 7. Prenatal Vitamins TABS; Therapy: (Recorded:30Sep2014) to 8. Tamsulosin HCl 0.4 MG CP24; Therapy: (Recorded:30Sep2014) to  Allergies Medication  1. Codeine Derivatives 2. Decongestant TABS 3. Percocet TABS  Family History Problems  1. Family history of  Family Health Status - Mother's Age 30. Family history of  Family Health Status Number Of Children 1 son 3. Family history of  Family Health Status Of Father - Alive 4. Paternal history of  Heart Disease V17.49 5. Paternal history of  Nephrolithiasis  Social History Problems    Caffeine Use   Marital History - Currently Married   Never A Smoker Denied    History of  Alcohol Use  Review of Systems Genitourinary, constitutional, skin, eye, otolaryngeal, hematologic/lymphatic, cardiovascular, pulmonary, endocrine, musculoskeletal, gastrointestinal, neurological and psychiatric system(s) were reviewed and pertinent findings if present are noted.  Genitourinary: urinary frequency, urinary urgency, urinary stream starts and stops, pelvic pain, suprapubic pain and initiating urination requires straining.  Gastrointestinal: nausea, vomiting, flank pain and abdominal pain.  Constitutional: fever (<100) and feeling tired (fatigue).  Eyes: blurred vision.  Musculoskeletal: back pain.  Neurological: dizziness.    Vitals Vital Signs [Data Includes: Last 1 Day]  30Sep2014 12:15PM  BMI Calculated: 22.99 BSA Calculated: 1.69 Height: 5 ft 5 in Weight: 138 lb  Blood Pressure: 124 / 85 Temperature: 98.2 F Heart Rate: 73  Physical Exam Constitutional: Well nourished and well developed . No acute distress.  ENT:. The ears and nose are normal in appearance.  Neck: The appearance of the neck is normal and no neck mass is present.  Pulmonary: No respiratory distress and normal respiratory rhythm and effort.  Cardiovascular: Heart rate and rhythm are normal . No peripheral edema.  Abdomen: The abdomen is  soft  and nontender. No masses are palpated. No CVA tenderness. No hernias are palpable. No hepatosplenomegaly noted.  Skin: Normal skin turgor, no visible rash and no visible skin lesions.  Neuro/Psych:. Mood and affect are appropriate.    Results/Data Urine [Data Includes: Last 1 Day]   30Sep2014  COLOR YELLOW   APPEARANCE CLEAR   SPECIFIC GRAVITY <1.005   pH 6.5   GLUCOSE NEG mg/dL  BILIRUBIN NEG   KETONE NEG mg/dL  BLOOD NEG   PROTEIN NEG mg/dL  UROBILINOGEN 0.2 mg/dL  NITRITE NEG   LEUKOCYTE ESTERASE NEG    Assessment Assessed  1. Ureteral Stone 592.1 2. Nephrolithiasis 592.0  Plan Ureteral Stone (592.1)  1. Follow-up Schedule Surgery Office  Follow-up  Requested for: 30Sep2014  Discussion/Summary  Edris is having ongoing fairly constant pain in her right flank and right abdomen all for 48 hours. She is interested in definitive procedure. We did discuss general stone passage rates. Surgically her last option is ureteroscopy with stone basketing/holmium laser lithotripsy. We talked about the pros and cons of intervention versus ongoing attempts at MET. We discussed the procedure in detail and risks and benefits. She is interested in going ahead with ureteroscopy today. We will attempt to get her on the schedule for later this afternoon/early evening. C   Signatures Electronically signed by : Barron Alvine, M.D.; Jan 12 2013  3:35PM

## 2013-01-12 NOTE — Anesthesia Preprocedure Evaluation (Addendum)
Anesthesia Evaluation  Patient identified by MRN, date of birth, ID band Patient awake    Reviewed: Allergy & Precautions, H&P , NPO status , Patient's Chart, lab work & pertinent test results  Airway Mallampati: II TM Distance: >3 FB Neck ROM: Full    Dental no notable dental hx.    Pulmonary neg pulmonary ROS,  breath sounds clear to auscultation  Pulmonary exam normal       Cardiovascular negative cardio ROS  Rhythm:Regular Rate:Normal     Neuro/Psych negative neurological ROS  negative psych ROS   GI/Hepatic negative GI ROS, Neg liver ROS,   Endo/Other  negative endocrine ROS  Renal/GU negative Renal ROS  negative genitourinary   Musculoskeletal negative musculoskeletal ROS (+)   Abdominal   Peds negative pediatric ROS (+)  Hematology negative hematology ROS (+)   Anesthesia Other Findings   Reproductive/Obstetrics negative OB ROS                           Anesthesia Physical Anesthesia Plan  ASA: I  Anesthesia Plan: General   Post-op Pain Management:    Induction: Intravenous  Airway Management Planned: LMA  Additional Equipment:   Intra-op Plan:   Post-operative Plan: Extubation in OR  Informed Consent: I have reviewed the patients History and Physical, chart, labs and discussed the procedure including the risks, benefits and alternatives for the proposed anesthesia with the patient or authorized representative who has indicated his/her understanding and acceptance.   Dental advisory given  Plan Discussed with: CRNA and Surgeon  Anesthesia Plan Comments:         Anesthesia Quick Evaluation  

## 2013-01-12 NOTE — Transfer of Care (Signed)
Immediate Anesthesia Transfer of Care Note  Patient: Jessica Diaz  Procedure(s) Performed: Procedure(s) with comments: CYSTOSCOPY WITH RETROGRADE PYELOGRAM, URETEROSCOPY AND STENT PLACEMENT (Right) - with stone basket extraction  Patient Location: PACU  Anesthesia Type:General  Level of Consciousness: awake, alert  and oriented  Airway & Oxygen Therapy: Patient Spontanous Breathing and Patient connected to face mask oxygen  Post-op Assessment: Report given to PACU RN and Post -op Vital signs reviewed and stable  Post vital signs: Reviewed and stable  Complications: No apparent anesthesia complications

## 2013-01-12 NOTE — Progress Notes (Signed)
Quick Note:  No growth on C&S 9/28 ______

## 2013-01-12 NOTE — Op Note (Signed)
Preoperative diagnosis: Multiple right ureteral calculi Postoperative diagnosis: Same  Procedure: Cystoscopy, right retrograde pyelogram, ureteroscopy, stone basketing, right double-J stent placement   Surgeon: Valetta Fuller M.D.  Anesthesia: Gen.  Indications: Ms. Jessica Diaz is a 22 year old female who presented 48-72 hours ago with severe right-sided abdominal and flank pain. She was diagnosed with multiple distal right ureteral calculi. She's continued to have severe discomfort. She was seen earlier today in our office and requested intervention. We felt she would be a good candidate for ureteroscopy. The risks and benefits of this procedure were discussed with her in detail.     Technique and findings: Patient was brought the operative room she had sessile induction general anesthesia. She was placed in lithotomy position and prepped and draped in usual manner. The patient received perioperative antibiotics and placement of PAS compression boots. Cystoscopy revealed a significantly inflamed right hemitrigone with edema and a very tight stenotic ureteral orifice. We were able to perform a limited right retrograde pyelogram with fluoroscopic interpretation. There was considerable obstruction of the distal right ureter with multiple filling defects. With guidewire placement through the right ureteral orifice there was immediate return of bloody urine. It appeared that she did have a high-grade/complete obstruction on that side. I was unable to engage a bit ureteroscope in the stenotic orifice and for that reason we used the inside portion of an access sheath to provide one step dilation. We were then easily able to engage the distal ureter. 2 separate 4 mm stones were noted in the distal ureter. These were both extracted with basket extraction without difficulty. Given the need for dilation as well as the considerable inflammatory changes in the distal ureter we felt that the sagittal place a double-J stent.  Over the guidewire we placed a 24 cm 6 French double-J stent with a dangle string secured or inner thigh. No obvious complications occurred the patient appeared to do well without incident.

## 2013-01-13 ENCOUNTER — Encounter (HOSPITAL_COMMUNITY): Payer: Self-pay | Admitting: Urology

## 2013-01-15 NOTE — Anesthesia Postprocedure Evaluation (Signed)
  Anesthesia Post-op Note  Patient: Jessica Diaz  Procedure(s) Performed: Procedure(s) (LRB): CYSTOSCOPY WITH RETROGRADE PYELOGRAM, URETEROSCOPY AND STENT PLACEMENT (Right)  Patient Location: PACU  Anesthesia Type: General  Level of Consciousness: awake and alert   Airway and Oxygen Therapy: Patient Spontanous Breathing  Post-op Pain: mild  Post-op Assessment: Post-op Vital signs reviewed, Patient's Cardiovascular Status Stable, Respiratory Function Stable, Patent Airway and No signs of Nausea or vomiting  Last Vitals:  Filed Vitals:   01/12/13 1905  BP: 130/81  Pulse: 88  Temp: 36.4 C  Resp: 20    Post-op Vital Signs: stable   Complications: No apparent anesthesia complications

## 2013-01-27 ENCOUNTER — Ambulatory Visit (HOSPITAL_BASED_OUTPATIENT_CLINIC_OR_DEPARTMENT_OTHER)
Admission: RE | Admit: 2013-01-27 | Discharge: 2013-01-27 | Disposition: A | Discharge status: Home / Self Care | Payer: No Typology Code available for payment source | Source: Ambulatory Visit | Attending: Family Medicine | Admitting: Family Medicine

## 2013-01-27 ENCOUNTER — Ambulatory Visit (INDEPENDENT_AMBULATORY_CARE_PROVIDER_SITE_OTHER): Payer: BC Managed Care – PPO | Admitting: Family Medicine

## 2013-01-27 ENCOUNTER — Encounter: Payer: Self-pay | Admitting: Family Medicine

## 2013-01-27 VITALS — BP 124/82 | HR 72 | Temp 98.5°F | Resp 16 | Wt 140.2 lb

## 2013-01-27 DIAGNOSIS — M79609 Pain in unspecified limb: Secondary | ICD-10-CM

## 2013-01-27 DIAGNOSIS — J329 Chronic sinusitis, unspecified: Secondary | ICD-10-CM

## 2013-01-27 DIAGNOSIS — M25531 Pain in right wrist: Secondary | ICD-10-CM | POA: Insufficient documentation

## 2013-01-27 DIAGNOSIS — I82619 Acute embolism and thrombosis of superficial veins of unspecified upper extremity: Secondary | ICD-10-CM | POA: Insufficient documentation

## 2013-01-27 MED ORDER — AMOXICILLIN 875 MG PO TABS: 875.0000 mg | ORAL_TABLET | Freq: Two times a day (BID) | ORAL | Status: DC

## 2013-01-27 MED ORDER — ALBUTEROL SULFATE HFA 108 (90 BASE) MCG/ACT IN AERS: 2.0000 | INHALATION_SPRAY | RESPIRATORY_TRACT | Status: DC | PRN

## 2013-01-27 NOTE — Progress Notes (Signed)
  Subjective:    Patient ID: Jessica Diaz, female    DOB: April 30, 1990, 22 y.o.   MRN: [id number]  HPI 'i think it's my sinuses'- + nasal congestion, productive cough.  sxs started Friday.  No fever.  + frontal facial pain.  + tooth pain.  R ear pain.  No N/V.  + sick contacts..  R wrist/forearm pain- went to ER 17 days ago for kidney stones.  Had IV placed in dorsum of R hand.  Has had exquisite pain w/ wrist movement and palpation of dorsal wrist and forearm since IV removal.     Review of Systems For ROS see HPI     Objective:   Physical Exam  Constitutional: She is oriented to person, place, and time. She appears well-developed and well-nourished. No distress.  HENT:  Head: Normocephalic and atraumatic.  Right Ear: Tympanic membrane normal.  Left Ear: Tympanic membrane normal.  Nose: Mucosal edema and rhinorrhea present. Right sinus exhibits maxillary sinus tenderness and frontal sinus tenderness. Left sinus exhibits maxillary sinus tenderness and frontal sinus tenderness.  Mouth/Throat: Uvula is midline and mucous membranes are normal. Posterior oropharyngeal erythema present. No oropharyngeal exudate.  Eyes: Conjunctivae and EOM are normal. Pupils are equal, round, and reactive to light.  Neck: Normal range of motion. Neck supple. No thyromegaly present.  Cardiovascular: Normal rate, regular rhythm, normal heart sounds and intact distal pulses.   No murmur heard. Pulmonary/Chest: Effort normal and breath sounds normal. No respiratory distress. She has no wheezes.  Musculoskeletal: She exhibits tenderness (dorsal R forearm). She exhibits no edema.  Lymphadenopathy:    She has no cervical adenopathy.  Neurological: She is alert and oriented to person, place, and time.  Skin: Skin is warm and dry. There is erythema (very TTP over cord like induration on dorsl R forearm).  Psychiatric: She has a normal mood and affect. Her behavior is normal.          Assessment & Plan:

## 2013-01-27 NOTE — Patient Instructions (Signed)
We'll notify you of your ultrasound results Start the Naproxen twice daily- take w/ food- for the arm pain and inflammation Take the Amoxicillin twice daily- take w/ food- for the sinus infection Mucinex DM for cough and congestion Call with any questions or concerns Hang in there!!!

## 2013-01-28 ENCOUNTER — Telehealth: Payer: Self-pay | Admitting: Family Medicine

## 2013-01-28 DIAGNOSIS — I808 Phlebitis and thrombophlebitis of other sites: Secondary | ICD-10-CM | POA: Insufficient documentation

## 2013-01-28 MED ORDER — RIVAROXABAN 20 MG PO TABS: 20.0000 mg | ORAL_TABLET | Freq: Every day | ORAL | Status: DC

## 2013-01-28 MED ORDER — RIVAROXABAN 15 MG PO TABS: 15.0000 mg | ORAL_TABLET | Freq: Two times a day (BID) | ORAL | Status: DC

## 2013-01-28 NOTE — Assessment & Plan Note (Signed)
Pt's sxs and PE consistent w/ infxn.  Start abx.  Reviewed supportive care and red flags that should prompt return.  Pt expressed understanding and is in agreement w/ plan.  

## 2013-01-28 NOTE — Telephone Encounter (Signed)
Pt notified of the results. States she is fine with all appts and wants to find out what is going on. Samples placed up front for pick up.

## 2013-01-28 NOTE — Assessment & Plan Note (Signed)
New.  Suspect clot but must determine if it's superficial vs DVT.  Start scheduled NSAIDs, heat.  Will await [place] results to determine next steps.

## 2013-01-28 NOTE — Telephone Encounter (Signed)
Pt has very extensive clot in R forearm.  Discussed size of clot w/ hematology who recommended a consultation to r/o a clotting disorder.  Will need to take Xarelto 15 mg twice daily x21 days and then 20mg  daily x2 months.  We have samples, coupons, and instructions for pick up.

## 2013-02-02 ENCOUNTER — Telehealth: Payer: Self-pay | Admitting: Family Medicine

## 2013-02-02 NOTE — Telephone Encounter (Signed)
Patient called and stated that she still has not heard from the hematologist and wanted to know did we still have samples of blood thinners or can she get it  prescribe to her. Thanks

## 2013-02-02 NOTE — Telephone Encounter (Signed)
Please advise on the samples? This message was routed to [facility] [place] also.    Mary,  Can you check on the status of the referral? This pt was seen by Dr. Beverely Low and sent for a Stat doppler evident of a clot in her RT forearm where IV was inserted during a Kidney stone surgery.

## 2013-02-03 ENCOUNTER — Encounter: Payer: Self-pay | Admitting: General Practice

## 2013-02-03 NOTE — Telephone Encounter (Signed)
Called and notified pt that samples were placed up front for her.

## 2013-02-03 NOTE — Telephone Encounter (Signed)
Ok for samples of Xarelto- need to ensure proper dosing (15mg  BID x21 days and then 20mg  daily)

## 2013-02-03 NOTE — Telephone Encounter (Signed)
Samples placed up front and letter printed for pt to clarify directions. Pt notified and chart updated.

## 2013-02-09 ENCOUNTER — Telehealth: Payer: Self-pay | Admitting: Hematology & Oncology

## 2013-02-09 NOTE — Telephone Encounter (Addendum)
I spoke w NEW PATIENT today to remind them of their appointment with Dr. Ennever. Also, advised them to bring all meds and insurance information. ° °

## 2013-02-10 ENCOUNTER — Ambulatory Visit (HOSPITAL_BASED_OUTPATIENT_CLINIC_OR_DEPARTMENT_OTHER): Payer: No Typology Code available for payment source | Admitting: Hematology & Oncology

## 2013-02-10 ENCOUNTER — Ambulatory Visit (HOSPITAL_BASED_OUTPATIENT_CLINIC_OR_DEPARTMENT_OTHER)
Admission: RE | Admit: 2013-02-10 | Discharge: 2013-02-10 | Disposition: A | Discharge status: Home / Self Care | Payer: No Typology Code available for payment source | Source: Ambulatory Visit | Attending: Hematology & Oncology | Admitting: Hematology & Oncology

## 2013-02-10 ENCOUNTER — Ambulatory Visit (HOSPITAL_BASED_OUTPATIENT_CLINIC_OR_DEPARTMENT_OTHER): Payer: No Typology Code available for payment source

## 2013-02-10 ENCOUNTER — Ambulatory Visit (HOSPITAL_BASED_OUTPATIENT_CLINIC_OR_DEPARTMENT_OTHER): Payer: No Typology Code available for payment source | Admitting: Lab

## 2013-02-10 DIAGNOSIS — I808 Phlebitis and thrombophlebitis of other sites: Secondary | ICD-10-CM

## 2013-02-10 DIAGNOSIS — I82619 Acute embolism and thrombosis of superficial veins of unspecified upper extremity: Secondary | ICD-10-CM | POA: Insufficient documentation

## 2013-02-10 LAB — CBC WITH DIFFERENTIAL (CANCER CENTER ONLY)
BASO#: 0 10*3/uL (ref 0.0–0.2)
EOS%: 1.9 % (ref 0.0–7.0)
Eosinophils Absolute: 0.1 10*3/uL (ref 0.0–0.5)
HCT: 39.9 % (ref 34.8–46.6)
HGB: 13.7 g/dL (ref 11.6–15.9)
LYMPH#: 2.4 10*3/uL (ref 0.9–3.3)
MCH: 30 pg (ref 26.0–34.0)
MCHC: 34.3 g/dL (ref 32.0–36.0)
MONO#: 0.5 10*3/uL (ref 0.1–0.9)
MONO%: 6.3 % (ref 0.0–13.0)
NEUT#: 4.5 10*3/uL (ref 1.5–6.5)
NEUT%: 59.5 % (ref 39.6–80.0)
Platelets: 210 10*3/uL (ref 145–400)
WBC: 7.5 10*3/uL (ref 3.9–10.0)

## 2013-02-10 NOTE — Progress Notes (Signed)
This office note has been dictated.

## 2013-02-11 ENCOUNTER — Telehealth: Payer: Self-pay | Admitting: Hematology & Oncology

## 2013-02-11 NOTE — Addendum Note (Signed)
Addended by: Josph Macho on: 02/11/2013 08:37 AM   Modules accepted: Orders

## 2013-02-11 NOTE — Telephone Encounter (Signed)
Per orders to sch follow up apt for patient.  Apt was sch for 03/22/13 and calendar was mailed out to patient's home

## 2013-02-11 NOTE — Progress Notes (Signed)
DIAGNOSIS:  Superficial venous thrombus of the right medial cubital branch.  HISTORY OF PRESENT ILLNESS:  Jessica Diaz is a very charming 22 year old white female.  She really has had no past medical problems.  She is seen by Dr. Beverely Low.  She was recently found to have issues with kidney stones.  These do run in the family.  She ultimately had to have surgery to remove the stones.  She apparently began to develop pain in the right arm.  This was probably about 3 weeks ago.  She had had an IV placed in that hand previously.  There was really no swelling in the right arm.  She had some tingling in her fingers.  She had no redness.  She had no cough or shortness of breath.  There was no leg swelling.  She had no headache.  She apparently went to her manicurist.  He noted that there were some changes in her right forearm.  As such, she went to see Dr. Beverely Low.  She noted that she had an indurated tender area on the right forearm.  Dr. Beverely Low went ahead and ordered a Doppler of her right arm. Surprisingly enough, this showed a superficial thrombus in the medial cubital branch arising from the basilic vein.  No other thrombus was noted.  There was no DVT.  She is currently on Xarelto.  She is on 15 mg twice a day.  She is still taking oral contraceptives.  She has a 23-year-old child.  She had no problems with the pregnancy. Her child was delivered at term.  She is not a vegetarian.  She does not smoke.  She has no other obvious risk factors.  She has had no change in bowel or bladder habits.  She has been on oral contraceptives because of bad endometriosis.  We were asked to see her to see if we could help with management of this superficial vein thrombus.  PAST MEDICAL HISTORY:  Remarkable for: 1. Endometriosis. 2. Rhinitis. 3. Kidney stones.  ALLERGIES: 1. Codeine. 2. Decongestants. 3. Oxycodone.  MEDICATIONS:  Albuterol inhaler 2 puffs q.4 hours p.r.n., Claritin 10  mg p.o. q. day, oral contraceptive 1 p.o. q. day, prenatal vitamins daily, Xarelto 15 mg p.o. b.i.d.  SOCIAL HISTORY:  Negative for tobacco use.  There is no real alcohol use.  She has no obvious occupational exposures.  She does work in [employer].  Again, she does have a 23-year-old son.  FAMILY HISTORY:  Pretty much unremarkable.  Her mother says that Jessica Diaz's maternal grandmother and maternal aunt had miscarriages.  There is no obvious history of a thrombus in the family.  REVIEW OF SYSTEMS:  As stated in the history of present.  She still has quite a bit of pain in the right arm.  There is no swelling.  There is no weakness.  PHYSICAL EXAMINATION:  General:  This is a well-developed, well- nourished white female in no obvious distress.  Vital signs: Temperature of 98.5, pulse 72, respiratory rate 18, blood pressure 124/82.  Weight is 137 pounds.  Head and Neck:  Normocephalic, atraumatic skull.  There are no ocular or oral lesions.  There are no palpable cervical or supraclavicular lymph nodes.  Lungs:  Clear to percussion and auscultation bilaterally.  Cardiac:  Regular rate and rhythm with a normal S1 and S2.  There are no murmurs, rubs, or bruits. Abdomen:  Soft.  She has good bowel sounds.  There is no fluid wave. There is no palpable  hepatosplenomegaly.  Back:  No tenderness over the spine, ribs, or hips.  Extremities:  Some tenderness to palpation over the right forearm.  No venous cord is noted.  She has good range of motion of her joints.  She has good strength.  She has good skin color and warmth.  Lower extremities are unremarkable for any palpable venous cord.  She has good range of motion of her lower extremity joints. Skin:  Some urticaria I think is secondary to stress from being in the office.  Neurological:  No focal neurological deficits are noted.  LABORATORY STUDIES:  White cell count is 7.5, hemoglobin 13.7, hematocrit 39.9, platelet count 210,000.  MCV  is 87.  Peripheral smear shows a normochromic, normocytic population of red blood cells.  There are no nucleated red blood cells.  I see no target cells.  She has no schistocytes or spherocytes.  White cells appear normal in morphology and maturation.  There are no immature myeloid or lymphoid forms.  She has no hypersegmented polys.  There are no blasts. Platelets are adequate in number and size.  IMPRESSION:  Jessica Diaz is a very charming 22 year old white female.  She has a superficial venous thrombus in the right forearm.  She had an IV in that arm previously.  It is really hard to say if she has a thrombophilic state.  The fact that she is on oral contraceptives would make a thrombophilic state more likely.  I told that she really had to get off oral contraceptives for right now. I really think that this could be a risk factor for her.  Again, she takes the oral contraceptives for endometriosis.  She does not have a gynecologist now.  We will have to try to find her one so she can have endometriosis regulated while off oral contraceptives.  I think she needs 3 months of anticoagulation.  I think this would be reasonable.  I am still not sure why she has pain in the right arm.  We will get another Doppler to evaluate this.  If we find that she has thrombus progression, then we are probably going to have to get her off Xarelto and switch her over to Coumadin, which would be more of an aggravation for her.  We would have to get her on low molecular weight heparin prior to Coumadin.  Thankfully, her dad and her mom were with her.  Her dad has a mechanical aortic valve, so he knows all about Coumadin.  I spent a good hour or so with Jessica Diaz and her parents.  I explained to them what I thought was going on.  I explained the different possibilities for hypercoagulability.  I explained to her why I really thought she needed to be off oral contraceptives right now.  We will see  about getting a Doppler of her right forearm tonight.  We will see what that shows.  I will likely plan to get her back to see [place] in about a month or so, if not sooner depending on the Doppler result.  If we do find she has a thrombophilic state, then she will not be permitted to be on oral contraceptives and will have to look at other forms of birth control as a means of controlling the endometriosis.  If she is thrombophilic, I think with her next pregnancy, she may need to be on low molecular weight heparin to help minimize fetal loss and thromboembolic occurrence.    ______________________________ Josph Macho,  M.D. PRE/MEDQ  D:  02/10/2013  T:  02/11/2013  Job:  [id number]

## 2013-02-12 LAB — HYPERCOAGULABLE PANEL, COMPREHENSIVE
Anticardiolipin IgA: 8 APL U/mL (ref <22)
Anticardiolipin IgM: 6 MPL U/mL (ref <11)
Beta-2-Glycoprotein I IgA: 0 A Units (ref <20)
Drvvt confirmation: 1.34 Ratio — ABNORMAL HIGH (ref <1.11)
PTT Lupus Anticoagulant: 53.4 secs — ABNORMAL HIGH (ref 28.0–43.0)
PTTLA 4:1 Mix: 52.2 secs — ABNORMAL HIGH (ref 28.0–43.0)
PTTLA Confirmation: 5 secs (ref <8.0)
Protein C Activity: 200 % — ABNORMAL HIGH (ref 75–133)
Protein C, Total: 148 % (ref 72–160)

## 2013-02-15 ENCOUNTER — Ambulatory Visit (HOSPITAL_BASED_OUTPATIENT_CLINIC_OR_DEPARTMENT_OTHER): Payer: No Typology Code available for payment source | Admitting: Hematology & Oncology

## 2013-02-15 DIAGNOSIS — I82611 Acute embolism and thrombosis of superficial veins of right upper extremity: Secondary | ICD-10-CM

## 2013-02-15 DIAGNOSIS — I82619 Acute embolism and thrombosis of superficial veins of unspecified upper extremity: Secondary | ICD-10-CM

## 2013-02-15 NOTE — Progress Notes (Signed)
This office note has been dictated.

## 2013-02-16 ENCOUNTER — Encounter: Payer: Self-pay | Admitting: Obstetrics & Gynecology

## 2013-02-16 ENCOUNTER — Ambulatory Visit (INDEPENDENT_AMBULATORY_CARE_PROVIDER_SITE_OTHER): Payer: BC Managed Care – PPO | Admitting: Obstetrics & Gynecology

## 2013-02-16 VITALS — BP 132/88 | HR 60 | Resp 16 | Ht 64.5 in | Wt 137.2 lb

## 2013-02-16 DIAGNOSIS — Z124 Encounter for screening for malignant neoplasm of cervix: Secondary | ICD-10-CM

## 2013-02-16 DIAGNOSIS — N809 Endometriosis, unspecified: Secondary | ICD-10-CM

## 2013-02-16 DIAGNOSIS — Z01419 Encounter for gynecological examination (general) (routine) without abnormal findings: Secondary | ICD-10-CM

## 2013-02-16 DIAGNOSIS — I82609 Acute embolism and thrombosis of unspecified veins of unspecified upper extremity: Secondary | ICD-10-CM

## 2013-02-16 DIAGNOSIS — I82601 Acute embolism and thrombosis of unspecified veins of right upper extremity: Secondary | ICD-10-CM

## 2013-02-16 DIAGNOSIS — N946 Dysmenorrhea, unspecified: Secondary | ICD-10-CM

## 2013-02-16 MED ORDER — MEFENAMIC ACID 250 MG PO CAPS: ORAL_CAPSULE | ORAL | Status: DC

## 2013-02-16 MED ORDER — NORETHINDRONE 0.35 MG PO TABS: 1.0000 | ORAL_TABLET | Freq: Every day | ORAL | Status: DC

## 2013-02-16 NOTE — Progress Notes (Signed)
DIAGNOSIS:  Superficial vein thrombus of the left forearm.  CURRENT THERAPY:  Xarelto 15 mg p.o. b.i.d.  INTERIM HISTORY:  Jessica Diaz comes in for an unscheduled visit.  We last saw her when we first saw her back on the 30th.  I did go ahead and repeat a Doppler on her at that point in time.  She is having more pain in the right forearm.  She had a little bit of swelling.  The Doppler was pretty much unchanged.  She did have some superficial thrombus within a branch of the basilic vein.  We did do her hypercoagulable studies.  She was found to be positive for lupus anticoagulant.  Her anticardiolipins were all normal.  Beta-2 glycoproteins were all normal.  We are trying confirm the lupus anticoagulant test.  Her factor V Leiden and prothrombin gene mutation were normal.  She had basically normal protein S, protein C, and antithrombin 3.  She is complaining of more pain and swelling.  It is hard to say if she is still using that arm.  She has a young child that she has to pick up and carry.  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  She has had no fever.  There has been no shortness of breath.  She has had no bleeding.  PHYSICAL EXAMINATION:  General:  This is a well-developed, well- nourished white female, in no obvious distress.  Vital Signs:  Stable with temperature of 98.6, pulse 72, respiratory rate 16, blood pressure 124/82.  Head and Neck:  No ocular or oral lesions.  There is no adenopathy in the neck.  Lungs:  Clear.  Cardiac:  Regular rate and rhythm with no murmurs, rubs, or bruits.  Abdomen:  Soft.  She has good bowel sounds.  Extremities:  Some mild swelling of the right forearm. No venous cord is noted.  I think I can probably palpate a valve in the basilic vein.  She has good pulses.  She has no tenderness in the upper right arm.  The left arm is unremarkable.  Skin:  No plethora on the right arm.  IMPRESSION:  Jessica Diaz is a nice 22 year old white female with  a superficial vein thrombus in the right forearm.  She was on oral contraceptives.   She had her child a couple years ago without any problems.  She was on oral contraceptives for endometriosis.  She now is off these.  Given this lupus anticoagulant testing positive, I think that we could see about getting her on 2 baby aspirin.  I think this would be reasonable.  This also may help with some inflammation that she could be having.  She does see Dr. Leda Quail of Gynecology tomorrow.  Dr. Hyacinth Meeker might be able to help with endometriosis issues.  We will continue to follow Jessica Diaz.  We will see her back in a few weeks.  If she does continue to be symptomatic, then I think we are going to be forced to change therapy on her.    ______________________________ Josph Macho, M.D. PRE/MEDQ  D:  02/15/2013  T:  02/16/2013  Job:  [id number]

## 2013-02-16 NOTE — Progress Notes (Signed)
22 y.o. [id number] MarriedCaucasianF here for annual exam.  H/o right upper extremity superficial vein thrombosis after IV placement.  H/O kidney stones.  Went to ER 9/27 due to pain.  IV was placed for fluids/pain medication/phenergan.  Had surgery 9/30 with Dr. Isabel Caprice.  Had pain in right arms for days.  Had venous doppler 10/15 which showed superficial clot in basilic vein.  Referred to Dr. Myna Hidalgo.  Coagulopathy testing pending.  Lupus anticoagulant +.  On xarelto.  Will be on this at least 3 months.  He has not decided yet whether patient needs full anticoagulation.  Seeing Dr. Myna Hidalgo again 12/8.    Pregnancy in 2011-12.  Son is almost three.  Vaginal delivery.  No DVT with pregnancy but swelling and blood pressure.  Was seen at [facility].    Diagnosed in 2008 via laparoscopy with endometriosis.  Treated with Lupron for six months.  H/O mirena use.  Was embedded in cervix.  Removed 7/13.  Declines use again.  On OCP (gildess) when DVT was diagnosed.  Off this now.     Patient's last menstrual period was 01/29/2013.          Sexually active: yes  The current method of family planning is condoms.   Exercising: yes  walking Smoker:  no  Health Maintenance: Pap:  2013 History of abnormal Pap:  no MMG:  none Colonoscopy:  none BMD:   none TDaP:  2013 Screening Labs: Dr Myna Hidalgo, Hb today: not done, Urine today: not done   reports that she has never smoked. She has never used smokeless tobacco. She reports that she does not drink alcohol or use illicit drugs.  Past Medical History  Diagnosis Date  . Endometriosis 2008  . Kidney stones   . UTI (lower urinary tract infection)   . PONV (postoperative nausea and vomiting)   . Family history of anesthesia complication     mother and father have N/V    Past Surgical History  Procedure Laterality Date  . Left knee surgery Left 2007  . Laparoscopic endometriosis fulguration  2008  . Wisdom tooth extraction  2013    two lowers and one upper  .  Cystoscopy with retrograde pyelogram, ureteroscopy and stent placement Right 01/12/2013    Procedure: CYSTOSCOPY WITH RETROGRADE PYELOGRAM, URETEROSCOPY AND STENT PLACEMENT;  Surgeon: Valetta Fuller, MD;  Location: [facility];  Service: Urology;  Laterality: Right;  with stone basket extraction    Current Outpatient Prescriptions  Medication Sig Dispense Refill  . albuterol (PROAIR HFA) 108 (90 BASE) MCG/ACT inhaler Inhale 2 puffs into the lungs every 4 (four) hours as needed for wheezing.  1 Inhaler  6  . aspirin 81 MG tablet Take 81 mg by mouth. Two every morning      . Biotin 1000 MCG tablet Take 1,000 mcg by mouth daily.       . cholecalciferol (VITAMIN D) 1000 UNITS tablet Take 1,000 Units by mouth daily.      Marland Kitchen loratadine (CLARITIN) 10 MG tablet Take 1 tablet (10 mg total) by mouth daily.  30 tablet  11  . Prenatal Vit-Fe Fumarate-FA (PRENATAL MULTIVITAMIN) TABS tablet Take 1 tablet by mouth daily.       . Rivaroxaban (XARELTO) 15 MG TABS tablet Take 15 mg by mouth 2 (two) times daily with a meal.       . norethindrone-ethinyl estradiol (JUNEL FE,GILDESS FE,LOESTRIN FE) 1-20 MG-MCG tablet Take 1 tablet by mouth daily.      Melene Muller  ON 02/19/2013] Rivaroxaban (XARELTO) 20 MG TABS tablet Take 20 mg by mouth daily.       No current facility-administered medications for this visit.    Family History  Problem Relation Age of Onset  . Breast cancer Mother 86  . Lung cancer Other     paternal side  . Diabetes Maternal Grandmother   . Gestational diabetes Mother   . Heart disease Father   . Hypertension Mother   . Hypertension Father   . Hyperlipidemia Mother   . Hyperlipidemia Father     ROS:  Pertinent items are noted in HPI.  Otherwise, a comprehensive ROS was negative.  Exam:   BP 132/88  Pulse 60  Resp 16  Ht 5' 4.5" (1.638 m)  Wt 137 lb 3.2 oz (62.234 kg)  BMI 23.20 kg/m2  LMP 01/29/2013    Height: 5' 4.5" (163.8 cm)  Ht Readings from Last 3 Encounters:  02/16/13 5' 4.5"  (1.638 m)  01/12/13 5\' 5"  (1.651 m)  01/12/13 5\' 5"  (1.651 m)    General appearance: alert, cooperative and appears stated age Head: Normocephalic, without obvious abnormality, atraumatic Neck: no adenopathy, supple, symmetrical, trachea midline and thyroid normal to inspection and palpation Lungs: clear to auscultation bilaterally Breasts: normal appearance, no masses or tenderness Heart: regular rate and rhythm Abdomen: soft, non-tender; bowel sounds normal; no masses,  no organomegaly Extremities: extremities normal, atraumatic, no cyanosis or edema Skin: Skin color, texture, turgor normal. No rashes or lesions Lymph nodes: Cervical, supraclavicular, and axillary nodes normal. No abnormal inguinal nodes palpated Neurologic: Grossly normal   Pelvic: External genitalia:  no lesions              Urethra:  normal appearing urethra with no masses, tenderness or lesions              Bartholins and Skenes: normal                 Vagina: normal appearing vagina with normal color and discharge, no lesions              Cervix: no lesions              Pap taken: yes Bimanual Exam:  Uterus:  normal size, contour, position, consistency, mobility, non-tender              Adnexa: normal adnexa and no mass, fullness, tenderness               Rectovaginal: Confirms               Anus:  normal sphincter tone, no lesions  A:  Well Woman with normal exam H/O upper extremity superficial clot Abnormal lupus anticoagulant (but testing done after clot) and currently on Xarelto H/O endometriosis with dysmenorrhea  P:   Mammogram starting ago 39.  Pap obtained today. Lengthy discussion with patient regarding options--progesterone only methods.  Has used Mirena IUD in past and declines use again.  Implanon/Nexplanon discussed.  Would be great if pt was amenorrheic with this method but pt aware about 15% discontinuation rate due to DUB.  Depo Provera discussed.  Pt has sister who is a CMA at ob/gyn office  and has told her not to use this.  We discussed weight gain, mood issues with this method.  I feel using it monthly for 3 months, I could get her amenorrheic which would really help with pain.  Pt promises to consider.  Also aware could restart Depo Lupron.  Pt used for 6 months in late teens.  I have some reservation about using again as she used it so young.  May need to wait an entire year for attempt at conception.  Pt aware limitations with use and that 12-18 months is really all she should use in lifetime.  As she is so young, I would like to try something else first.  Discussed progesterone only pills.  Aware this will give [facility] but should use back up method for at least first month.  For now will start Micronor.  Rx to pharmacy. Rx for ponstel which she has used in past with success.  Aware I will double check with Dr. Myna Hidalgo due to possible increased bleeding risk with Xarelto RTC 3 moths. return annually or prn  In excess of 1 hour with patient due to complicated PMHx and discussion of treatment options in addition to completion of AEX.

## 2013-02-17 NOTE — Patient Instructions (Signed)

## 2013-02-18 ENCOUNTER — Other Ambulatory Visit: Payer: Self-pay

## 2013-02-22 ENCOUNTER — Telehealth: Payer: Self-pay | Admitting: Obstetrics & Gynecology

## 2013-02-22 NOTE — Telephone Encounter (Addendum)
Message copied by Jerene Bears on Mon Feb 22, 2013  8:11 AM ------      Message from: Arlan Organ R      Created: Fri Feb 19, 2013  5:20 PM       Rosalita Chessman: We can keep her on Xarelto and low dose ASA.  I do not see that we need to make a change.            Thanks for all of your help!!            Cindee Lame             ------  Message communicated with Dr. Myna Hidalgo through [facility].  Pt was questioning whether she needed to stay on Xarelto and ASA or transition to Lovenox.  Dr. Myna Hidalgo reviewed all labs and advised to stay on current regimen.  Left detailed message with pt, per her request if she didn't answer, communicating this information.  Asked her to call back with any questions/concerns.

## 2013-03-05 ENCOUNTER — Telehealth: Payer: Self-pay | Admitting: Obstetrics & Gynecology

## 2013-03-05 NOTE — Telephone Encounter (Signed)
Patient has some questions about the RX for birth control she was recently prescribed.

## 2013-03-05 NOTE — Telephone Encounter (Signed)
Message left to return call to Dorothyann Mourer at [phone].    

## 2013-03-08 ENCOUNTER — Encounter: Payer: Self-pay | Admitting: Obstetrics & Gynecology

## 2013-03-08 ENCOUNTER — Telehealth: Payer: Self-pay | Admitting: Family Medicine

## 2013-03-08 ENCOUNTER — Ambulatory Visit (INDEPENDENT_AMBULATORY_CARE_PROVIDER_SITE_OTHER): Payer: BC Managed Care – PPO | Admitting: Obstetrics & Gynecology

## 2013-03-08 ENCOUNTER — Telehealth: Payer: Self-pay | Admitting: *Deleted

## 2013-03-08 VITALS — BP 120/78 | HR 64 | Resp 20 | Ht 64.5 in | Wt 138.0 lb

## 2013-03-08 DIAGNOSIS — N920 Excessive and frequent menstruation with regular cycle: Secondary | ICD-10-CM

## 2013-03-08 MED ORDER — RIVAROXABAN 20 MG PO TABS: 20.0000 mg | ORAL_TABLET | Freq: Every day | ORAL | Status: DC

## 2013-03-08 NOTE — Telephone Encounter (Signed)
Spoke to pt. Informed her that this office doesn't carry samples of Xarelto but can provide her with a co-pay discount card if needed. She'd like to call Dr Beverely Low 1st to see if she can get further samples, if not she will call back.

## 2013-03-08 NOTE — Telephone Encounter (Signed)
Pt notified. Samples given to the front desk.

## 2013-03-08 NOTE — Telephone Encounter (Signed)
Patient returned call.  She states she has been having heavy bleeding since Thursday night. She was on week 3 of her micronor active pills and developed cramping and then started bleeding. She states she has been changing pad q 1.5 hours. Currently on Xarelto. She denies being lightheaded, dizzy or weak.   Dr. Hyacinth Meeker, you had a 4:00 opening and I scheduled her for that spot. Do you want her to come earlier?

## 2013-03-08 NOTE — Telephone Encounter (Signed)
Ok for 20mg  samples if available

## 2013-03-08 NOTE — Progress Notes (Signed)
Subjective:     Patient ID: Jessica Diaz, female   DOB: [birth year], 22 y.o.   MRN: [id number]  HPI 22 yo G1P1 MWF here for discussion of bleeding.  This is first since coming off of estrogen containing OCPs and starting the micronor.  Was heavy with small clots.  Lots of cramping due to the flow.  She is currently on Xarelto as well.  Also taking 2 baby aspirins each day.  Had been on continuous OCPs so this was a BIG change for her.  Did use the ponstel with success for the cramping.  She just felt like she needed reassurance.  Pain and bleeding are so much better today.  Seeing Dr. Myna Hidalgo 12/8.  Not sure when she will have repeat blood work.    Review of Systems  All other systems reviewed and are negative.       Objective:   Physical Exam  Constitutional: She is oriented to person, place, and time. She appears well-developed and well-nourished.  Cardiovascular: Normal rate and regular rhythm.   Pulmonary/Chest: Effort normal and breath sounds normal.  Abdominal: Soft. Bowel sounds are normal.  Genitourinary: Uterus is not enlarged, not fixed and not tender. Cervix exhibits no motion tenderness. There is bleeding around the vagina. No vaginal discharge found.  Musculoskeletal: Normal range of motion.  Lymphadenopathy:       Right: No inguinal adenopathy present.       Left: No inguinal adenopathy present.  Neurological: She is alert and oriented to person, place, and time.  Skin: Skin is warm and dry.  Psychiatric: She has a normal mood and affect.       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  I'm not ready to make a change yet.  I feel we should watch another month or two.  She is ok with this.    Pt is to call if next cycle is this heavy.

## 2013-03-08 NOTE — Telephone Encounter (Signed)
Ok for samples if we have them 

## 2013-03-08 NOTE — Telephone Encounter (Signed)
Patient called and wanted to see if she could get samples of Rivaroxaban (XARELTO) 20 MG TABS tablet

## 2013-03-08 NOTE — Patient Instructions (Signed)
Please call with any new problems/issues. 

## 2013-03-08 NOTE — Telephone Encounter (Signed)
This is fine. Thanks

## 2013-03-18 ENCOUNTER — Telehealth: Payer: Self-pay | Admitting: Obstetrics & Gynecology

## 2013-03-18 MED ORDER — MEGESTROL ACETATE 40 MG PO TABS: ORAL_TABLET | ORAL | Status: DC

## 2013-03-18 NOTE — Telephone Encounter (Signed)
Spoke with patient. States she has been having vaginal bleeding today makes two weeks. She is currently on Xarelto and taking two baby asa.  Today she is changing pad q 1.5 hours. Feels that bleeding is increasing. Denies weakness, dizziness, but feels fatigued.  Had some clots on Monday but they stopped.   S/W Dr. Hyacinth Meeker, ordered Megace 40 mg BID. To take one dose tonight. Take one dose in AM. Give update in AM. Continue bid until bleeding stops and then 40 mg daily. Stay on this for a few weeks. Instructions given to patient. She is agreeable and will call back in the morning with update.

## 2013-03-18 NOTE — Telephone Encounter (Signed)
Patient called during lunch wanted nurse to call her.

## 2013-03-18 NOTE — Telephone Encounter (Signed)
Message left to return call to Chauncy Mangiaracina at [phone].    

## 2013-03-19 NOTE — Telephone Encounter (Signed)
Called patient for update. She states that bleeding has really improved. Changing pad q 3 hours. States she feels more comfortable now. I advised patient to call us back and let us know when bleeding stops and she starts on one tablet per day. Advised patient if bleeding worsens again or any worsening symptoms to call back or seek emergency care. Patient is agreeable to plan and verbalizes understanding of instructions.

## 2013-03-22 ENCOUNTER — Ambulatory Visit (HOSPITAL_BASED_OUTPATIENT_CLINIC_OR_DEPARTMENT_OTHER): Payer: No Typology Code available for payment source | Admitting: Hematology & Oncology

## 2013-03-22 ENCOUNTER — Other Ambulatory Visit (HOSPITAL_BASED_OUTPATIENT_CLINIC_OR_DEPARTMENT_OTHER): Payer: No Typology Code available for payment source | Admitting: Lab

## 2013-03-22 VITALS — BP 122/72 | HR 73 | Temp 98.0°F | Resp 73 | Ht 64.0 in | Wt 139.0 lb

## 2013-03-22 DIAGNOSIS — I808 Phlebitis and thrombophlebitis of other sites: Secondary | ICD-10-CM

## 2013-03-22 DIAGNOSIS — I82619 Acute embolism and thrombosis of superficial veins of unspecified upper extremity: Secondary | ICD-10-CM

## 2013-03-22 LAB — CBC WITH DIFFERENTIAL (CANCER CENTER ONLY)
BASO#: 0 10*3/uL (ref 0.0–0.2)
Eosinophils Absolute: 0.2 10*3/uL (ref 0.0–0.5)
HCT: 37.8 % (ref 34.8–46.6)
HGB: 12.8 g/dL (ref 11.6–15.9)
LYMPH%: 27.4 % (ref 14.0–48.0)
MCH: 29.8 pg (ref 26.0–34.0)
MCHC: 33.9 g/dL (ref 32.0–36.0)
MCV: 88 fL (ref 81–101)
MONO#: 0.6 10*3/uL (ref 0.1–0.9)
MONO%: 6.7 % (ref 0.0–13.0)
NEUT%: 63.8 % (ref 39.6–80.0)
Platelets: 247 10*3/uL (ref 145–400)
RBC: 4.29 10*6/uL (ref 3.70–5.32)
WBC: 9.5 10*3/uL (ref 3.9–10.0)

## 2013-03-22 NOTE — Progress Notes (Signed)
This office note has been dictated.

## 2013-03-24 NOTE — Progress Notes (Signed)
CC:   [name], M.D. 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  CURRENT THERAPY: 1. Xarelto 20 mg p.o. daily. 2. Aspirin 81 mg p.o. daily.  INTERIM HISTORY:  Ms. Douse comes in for followup.  She is feeling much better.  Her right forearm is feeling better.  There is no pain in the right forearm.  There is no swelling in the right forearm.  We did go ahead and repeat her lupus anticoagulant study.  It did come back negative once we did a confirmatory test.  All other hypercoagulable studies are negative.  She will finish up the Xarelto in 3 weeks.  She will then just go on baby aspirin.  She saw Dr. Leda Quail of Gynecology.  Dr. Hyacinth Meeker put her on Micronor.  She also put her on some Megace.  This has helped with her endometriosis.  Ms. Pasquini has had slight oozing from the gums on occasion.  There has been no bleeding from the bowel or bladder.  There have been no nosebleeds.  She has had no bruising.  PHYSICAL EXAMINATION:  General:  This is a well-developed, well- nourished white female, in no obvious distress.  Vital Signs: Temperature 98, pulse 73, respiratory rate 14, blood pressure 122/72. Weight is 139 pounds.  Head and Neck:  Normocephalic, atraumatic skull. There are no ocular or oral lesions.  There are no palpable cervical or supraclavicular lymph nodes.  Lungs:  Clear bilaterally.  Cardiac: Regular rate and rhythm with normal S1, S2.  There are no murmurs, rubs, or bruits.  Abdomen:  Soft.  She has good bowel sounds.  There is no fluid wave.  There is no palpable abdominal mass.  There is no palpable hepatosplenomegaly.  Back:  No tenderness over the spine, ribs, or hips. Extremities:  No tenderness over the right forearm.  No venous cord is noted.  She has good range motion of her right arm.  She has good pulses in her distal extremities.  Her other extremities are without changes. Skin:  No rashes,  ecchymoses, or petechia.  LABORATORY STUDIES:  White cell count is 9.5, hemoglobin 12.8, hematocrit 37.8, platelet count 247.  IMPRESSION:  Ms. Heimann is a very charming 22 year old white female with a superficial thrombus in the left forearm.  I feel that the Xarelto finishing up in 3 weeks will be appropriate for her.  I will follow up with a repeat Doppler after she is done with the Xarelto.  I told her to continue the baby aspirin for right now.  Once her Xarelto finishes, I told her to keep on with the baby aspirin.  She and her husband are going to try to have another child in the summertime.  I told Ms. Wingert that there is a possibility that she may need to be on therapeutic anticoagulation during the pregnancy and afterwards.  This will mean that she would have to be on Lovenox, which is an injection.  I will check into this to see if this truly is something that she will need.  We will go ahead and plan to get her back in about 6 weeks.  We will get the Doppler the same day that we see her back.    ______________________________ Josph Macho, M.D. PRE/MEDQ  D:  03/22/2013  T:  03/22/2013  Job:  [id number]

## 2013-04-12 ENCOUNTER — Ambulatory Visit: Payer: BC Managed Care – PPO | Admitting: Obstetrics & Gynecology

## 2013-04-12 ENCOUNTER — Ambulatory Visit (INDEPENDENT_AMBULATORY_CARE_PROVIDER_SITE_OTHER): Payer: BC Managed Care – PPO | Admitting: Obstetrics & Gynecology

## 2013-04-12 VITALS — BP 98/66 | HR 58 | Resp 16 | Ht 64.5 in | Wt 137.6 lb

## 2013-04-12 DIAGNOSIS — N925 Other specified irregular menstruation: Secondary | ICD-10-CM

## 2013-04-12 DIAGNOSIS — N809 Endometriosis, unspecified: Secondary | ICD-10-CM

## 2013-04-12 DIAGNOSIS — N949 Unspecified condition associated with female genital organs and menstrual cycle: Secondary | ICD-10-CM

## 2013-04-12 DIAGNOSIS — N938 Other specified abnormal uterine and vaginal bleeding: Secondary | ICD-10-CM

## 2013-04-12 NOTE — Progress Notes (Signed)
22 y.o. Married Caucasian female [id number] here for follow up after starting   Pt reports she stopped the Xarelto a week early, around 12/14.  She was having so many issues with gum sensitivity and gum bleeding.  She feels like she has lost some gum in the front of her mouth.  Will be seeing her dentist next week.  Issues have really improved since stopping Xarelto.  Since starting the Micronor, she had really heavy bleeding 11/24.  Was seen in the office.  She was treated with Megace which did help.  She stopped bleeding after 5 days of the megace.   Then started bleeding 12/1-12/8.  Bleeding was heavy at first and then lighter, more normal.  Started again 12/27.  Flow is moderate.  It is not as heavy as in November.  Discussed with pt just stopping this.  She is very concerned due to her hx of endometriosis and irregular bleeding.  I feel she would bleed less on her own but she declines.  Also has declined and continues to decline IUD use.  She is contemplating pregnancy and very worried about needing Lovenox.  It would be my recommendation, at this time, that she be anticoagulated in pregnancy.  Discussed this with pt.  She is anxious about this with fears of bleeding.  Discussed with pt molecule size of heparin and why this is safe in pregnancy.  I feel she would benefit from MFM referral.  She is pleased with this idea.    Has arm doppler scheduled to see if clot is resolved.  Pt needs to change this for insurance reasons.  She will let me know once this is complete and then I can refer her to MFM.  All questions answered.  O: Healthy WD,WN female Affect: normal Skin: normal  A:Menorrhaiga and DUB on Micronor H/O superficial thrombus in forearm Contemplating pregnancy later this year H/O endometriosis  P:continue micronor Call when doppler is done so I can also review MFM referral will be made after that time If she continues to have irregular bleeding, will add daily megace to see if this  helps.  She did well on the Megace with the heavy bleeding.  Pt will call and give me update next month or if heavy bleeding occurs again.

## 2013-04-13 ENCOUNTER — Encounter: Payer: Self-pay | Admitting: Obstetrics & Gynecology

## 2013-04-13 NOTE — Patient Instructions (Signed)
Please call if you have any further heavy bleeding.

## 2013-04-16 ENCOUNTER — Telehealth: Payer: Self-pay | Admitting: Hematology & Oncology

## 2013-04-16 ENCOUNTER — Telehealth: Payer: Self-pay | Admitting: *Deleted

## 2013-04-16 NOTE — Telephone Encounter (Signed)
Pt had appointment for 04-23-13 is having to change insurance but can't come until feb. Transferred to RN, pt is aware to call back Monday if she hasn't heard from me.

## 2013-04-16 NOTE — Telephone Encounter (Signed)
Patient called stating she has insurance issues and wont be insured until 05/16/13.  Patient went off of Xarelto 20 about 1 1/2 ago. Has been on [employer]a since then.  Patient wants to reschedule Doppler, lab, and MD appt until after 05/16/13.  Ok with Dr. Myna HidalgoEnnever.  Called patient with this information.

## 2013-04-16 NOTE — Telephone Encounter (Signed)
Pt aware of 05-20-13 appointments

## 2013-04-23 ENCOUNTER — Other Ambulatory Visit: Payer: BC Managed Care – PPO | Admitting: Lab

## 2013-04-23 ENCOUNTER — Ambulatory Visit (HOSPITAL_BASED_OUTPATIENT_CLINIC_OR_DEPARTMENT_OTHER): Payer: BC Managed Care – PPO

## 2013-04-23 ENCOUNTER — Ambulatory Visit: Payer: BC Managed Care – PPO | Admitting: Hematology & Oncology

## 2013-04-28 ENCOUNTER — Telehealth: Payer: Self-pay | Admitting: Obstetrics & Gynecology

## 2013-04-28 NOTE — Telephone Encounter (Signed)
Pt started her cycle now so can she stop the Micronor.

## 2013-04-29 NOTE — Telephone Encounter (Signed)
Patient calling, wants to know if it is okay if she dc Micronor? Started a cycle on 04/26/13. States "it is not heavy, it's very mild".  Wanted to know if Dr. Hyacinth MeekerMiller felt it was okay to stop taking. Patient still has not had doppler of arm.

## 2013-04-30 NOTE — Telephone Encounter (Signed)
Spoke with patient. Message from Dr. Hyacinth MeekerMiller given. Verbalized understanding and patient states she is aware of instructions from Dr. Hyacinth MeekerMiller and understands safety issues regarding pregancy and need to be on birth control. Did not take micronor last night, so she will continue not to take it. She is on her cycle now and will call back if continues to have bleeding. Was wondering if she should take Megace again. Advised if bleeding is mild, may not need megace. She will call on Tuesday to update regarding period, to see how it is since stopping micronor.

## 2013-04-30 NOTE — Telephone Encounter (Signed)
I tried to call her.  I did not leave a message.  It is ok to stop.  She ABSOLUTELY needs birth control/condoms.  No estrogen products.  Also she knows I want her to see MFM before trying to conceive to find out if needs Lovenox/heparin in pregnancy.

## 2013-05-19 ENCOUNTER — Other Ambulatory Visit: Payer: Self-pay | Admitting: Nurse Practitioner

## 2013-05-19 DIAGNOSIS — Z87448 Personal history of other diseases of urinary system: Secondary | ICD-10-CM

## 2013-05-19 DIAGNOSIS — N809 Endometriosis, unspecified: Secondary | ICD-10-CM

## 2013-05-20 ENCOUNTER — Ambulatory Visit (HOSPITAL_BASED_OUTPATIENT_CLINIC_OR_DEPARTMENT_OTHER): Payer: BC Managed Care – PPO | Admitting: Hematology & Oncology

## 2013-05-20 ENCOUNTER — Encounter: Payer: Self-pay | Admitting: Hematology & Oncology

## 2013-05-20 ENCOUNTER — Ambulatory Visit (HOSPITAL_BASED_OUTPATIENT_CLINIC_OR_DEPARTMENT_OTHER)
Admission: RE | Admit: 2013-05-20 | Discharge: 2013-05-20 | Disposition: A | Discharge status: Home / Self Care | Payer: BC Managed Care – PPO | Source: Ambulatory Visit | Attending: Hematology & Oncology | Admitting: Hematology & Oncology

## 2013-05-20 ENCOUNTER — Other Ambulatory Visit (HOSPITAL_BASED_OUTPATIENT_CLINIC_OR_DEPARTMENT_OTHER): Payer: BC Managed Care – PPO | Admitting: Lab

## 2013-05-20 VITALS — BP 115/66 | HR 78 | Temp 97.9°F | Resp 14 | Ht 64.0 in | Wt 142.0 lb

## 2013-05-20 DIAGNOSIS — I82619 Acute embolism and thrombosis of superficial veins of unspecified upper extremity: Secondary | ICD-10-CM

## 2013-05-20 DIAGNOSIS — Z86718 Personal history of other venous thrombosis and embolism: Secondary | ICD-10-CM | POA: Insufficient documentation

## 2013-05-20 DIAGNOSIS — I808 Phlebitis and thrombophlebitis of other sites: Secondary | ICD-10-CM

## 2013-05-20 DIAGNOSIS — N809 Endometriosis, unspecified: Secondary | ICD-10-CM

## 2013-05-20 DIAGNOSIS — I742 Embolism and thrombosis of arteries of the upper extremities: Secondary | ICD-10-CM | POA: Insufficient documentation

## 2013-05-20 DIAGNOSIS — Z7982 Long term (current) use of aspirin: Secondary | ICD-10-CM

## 2013-05-20 DIAGNOSIS — Z87448 Personal history of other diseases of urinary system: Secondary | ICD-10-CM

## 2013-05-20 DIAGNOSIS — I82611 Acute embolism and thrombosis of superficial veins of right upper extremity: Secondary | ICD-10-CM

## 2013-05-20 LAB — CBC WITH DIFFERENTIAL (CANCER CENTER ONLY)
BASO#: 0 10*3/uL (ref 0.0–0.2)
BASO%: 0.2 % (ref 0.0–2.0)
EOS ABS: 0.1 10*3/uL (ref 0.0–0.5)
EOS%: 1.3 % (ref 0.0–7.0)
HCT: 42 % (ref 34.8–46.6)
HGB: 14.4 g/dL (ref 11.6–15.9)
LYMPH#: 2.2 10*3/uL (ref 0.9–3.3)
LYMPH%: 23.4 % (ref 14.0–48.0)
MCH: 29.9 pg (ref 26.0–34.0)
MCHC: 34.3 g/dL (ref 32.0–36.0)
MCV: 87 fL (ref 81–101)
MONO#: 0.6 10*3/uL (ref 0.1–0.9)
MONO%: 6.4 % (ref 0.0–13.0)
NEUT#: 6.4 10*3/uL (ref 1.5–6.5)
NEUT%: 68.7 % (ref 39.6–80.0)
PLATELETS: 221 10*3/uL (ref 145–400)
RBC: 4.81 10*6/uL (ref 3.70–5.32)
RDW: 12.4 % (ref 11.1–15.7)
WBC: 9.3 10*3/uL (ref 3.9–10.0)

## 2013-05-20 NOTE — Progress Notes (Signed)
This office note has been dictated.

## 2013-05-21 NOTE — Progress Notes (Signed)
CC:   Neena RhymesKatherine Tabori, M.D. Lum KeasM. Suzanne Miller, MD  DIAGNOSIS:  Superficial thrombus of the right forearm.  CURRENT THERAPY:  Aspirin 162 mg p.o. daily.  INTERIM HISTORY:  [name] comes in for followup.  We last her saw back in December.  She finished up her 3 months of Xarelto at the end of December.  She is off all birth control right now.  She and her husband want to try to have a child.  They are not sure when they are going to try to start to have one.  We did go ahead and repeat a Doppler of her right arm.  This was done today.  There is no evidence of residual thrombus in the basilic vein.  She has not had any cycles for 3 weeks.  She is happy about this.  She said when she got off all of her hormonal treatment, she started having improvement in her monthly cycles.  She has had no leg swelling.  She has had no rashes.  There has been no nausea or vomiting.  PHYSICAL EXAMINATION:  General:  This is a well-developed, well- nourished white female, in no obvious distress.  Vital Signs: Temperature of 97.9, pulse 78, respiratory rate 14, blood pressure 115/66, weight is 142 pounds.  Head and Neck:  Normocephalic, atraumatic skull.  There are no ocular or oral lesions.  There are no palpable cervical or supraclavicular lymph nodes.  Lungs:  Clear bilaterally. Cardiac:  Regular rate and rhythm with a normal S1 and S2.  There are no murmurs, rubs, or bruits.  Abdomen:  Soft with good bowel sounds.  She has no fluid wave.  There is no palpable hepatosplenomegaly.  Back:  No tenderness over the spine, ribs, or hips.  Extremities:  Show no clubbing, cyanosis, or edema.  She has no venous cord in the right arm. She has no swelling in the right arm.  Skin:  No rashes, ecchymosis, or petechia.  LABORATORY STUDIES:  White cell count 9.3, hemoglobin 14.4, hematocrit 42, platelet count 221.  IMPRESSION:  [name] is a very charming 23 year old white female.  She developed a  superficial thrombus in her right arm.  We treated her with 3 months of anticoagulation with Xarelto.  From my point of view, she and her husband are trying to have a baby.  I told her that when she becomes pregnant, she will have to go on to Lovenox.  I think 40 mg a day of Lovenox throughout her pregnancy and afterwards would be appropriate for her.  Of note, when we first saw her, and she tested positive for the lupus anticoagulant, but this was a negative on confirmatory testing.  Again, I do not see any problem with her trying to conceive right now. This is a superficial thrombus and not a deep vein thrombus.  Again, I told [name] and her husband to let us know when she gets pregnant so that we get her back to see us and get her on anticoagulation with Lovenox.  I spent a good half hour or so with she and her husband.  I explained to them what we needed to do, if she were to get pregnant.  She and her husband both understand this explicitly.    ______________________________ Josph MachoPeter R Ermin Parisien, M.D. PRE/MEDQ  D:  05/20/2013  T:  05/21/2013  Job:  [id number]

## 2013-05-25 ENCOUNTER — Encounter: Payer: Self-pay | Admitting: Family Medicine

## 2013-05-25 ENCOUNTER — Ambulatory Visit (INDEPENDENT_AMBULATORY_CARE_PROVIDER_SITE_OTHER): Payer: BC Managed Care – PPO | Admitting: Family Medicine

## 2013-05-25 VITALS — BP 114/74 | HR 78 | Temp 98.3°F | Wt 142.0 lb

## 2013-05-25 DIAGNOSIS — R591 Generalized enlarged lymph nodes: Secondary | ICD-10-CM

## 2013-05-25 DIAGNOSIS — R599 Enlarged lymph nodes, unspecified: Secondary | ICD-10-CM

## 2013-05-25 LAB — CBC WITH DIFFERENTIAL/PLATELET
BASOS PCT: 0.4 % (ref 0.0–3.0)
Basophils Absolute: 0 10*3/uL (ref 0.0–0.1)
EOS PCT: 1.4 % (ref 0.0–5.0)
Eosinophils Absolute: 0.1 10*3/uL (ref 0.0–0.7)
HCT: 40.6 % (ref 36.0–46.0)
Hemoglobin: 13.5 g/dL (ref 12.0–15.0)
Lymphocytes Relative: 22.8 % (ref 12.0–46.0)
Lymphs Abs: 2 10*3/uL (ref 0.7–4.0)
MCHC: 33.3 g/dL (ref 30.0–36.0)
MCV: 89.9 fl (ref 78.0–100.0)
MONO ABS: 0.5 10*3/uL (ref 0.1–1.0)
Monocytes Relative: 6.2 % (ref 3.0–12.0)
NEUTROS ABS: 6 10*3/uL (ref 1.4–7.7)
NEUTROS PCT: 69.2 % (ref 43.0–77.0)
Platelets: 221 10*3/uL (ref 150.0–400.0)
RBC: 4.51 Mil/uL (ref 3.87–5.11)
RDW: 12.5 % (ref 11.5–14.6)
WBC: 8.7 10*3/uL (ref 4.5–10.5)

## 2013-05-25 MED ORDER — CEPHALEXIN 500 MG PO CAPS: 500.0000 mg | ORAL_CAPSULE | Freq: Two times a day (BID) | ORAL | Status: DC

## 2013-05-25 NOTE — Patient Instructions (Signed)
Lymphadenopathy °Lymphadenopathy means "disease of the lymph glands." But the term is usually used to describe swollen or enlarged lymph glands, also called lymph nodes. These are the bean-shaped organs found in many locations including the neck, underarm, and groin. Lymph glands are part of the immune system, which fights infections in your body. Lymphadenopathy can occur in just one area of the body, such as the neck, or it can be generalized, with lymph node enlargement in several areas. The nodes found in the neck are the most common sites of lymphadenopathy. °CAUSES  °When your immune system responds to germs (such as viruses or bacteria ), infection-fighting cells and fluid build up. This causes the glands to grow in size. This is usually not something to worry about. Sometimes, the glands themselves can become infected and inflamed. This is called lymphadenitis. °Enlarged lymph nodes can be caused by many diseases: °· Bacterial disease, such as strep throat or a skin infection. °· Viral disease, such as a common cold. °· Other germs, such as lyme disease, tuberculosis, or sexually transmitted diseases. °· Cancers, such as lymphoma (cancer of the lymphatic system) or leukemia (cancer of the white blood cells). °· Inflammatory diseases such as lupus or rheumatoid arthritis. °· Reactions to medications. °Many of the diseases above are rare, but important. This is why you should see your caregiver if you have lymphadenopathy. °SYMPTOMS  °· Swollen, enlarged lumps in the neck, back of the head or other locations. °· Tenderness. °· Warmth or redness of the skin over the lymph nodes. °· Fever. °DIAGNOSIS  °Enlarged lymph nodes are often near the source of infection. They can help healthcare providers diagnose your illness. For instance:  °· Swollen lymph nodes around the jaw might be caused by an infection in the mouth. °· Enlarged glands in the neck often signal a throat infection. °· Lymph nodes that are swollen  in more than one area often indicate an illness caused by a virus. °Your caregiver most likely will know what is causing your lymphadenopathy after listening to your history and examining you. Blood tests, x-rays or other tests may be needed. If the cause of the enlarged lymph node cannot be found, and it does not go away by itself, then a biopsy may be needed. Your caregiver will discuss this with you. °TREATMENT  °Treatment for your enlarged lymph nodes will depend on the cause. Many times the nodes will shrink to normal size by themselves, with no treatment. Antibiotics or other medicines may be needed for infection. Only take over-the-counter or prescription medicines for pain, discomfort or fever as directed by your caregiver. °HOME CARE INSTRUCTIONS  °Swollen lymph glands usually return to normal when the underlying medical condition goes away. If they persist, contact your health-care provider. He/she might prescribe antibiotics or other treatments, depending on the diagnosis. Take any medications exactly as prescribed. Keep any follow-up appointments made to check on the condition of your enlarged nodes.  °SEEK MEDICAL CARE IF:  °· Swelling lasts for more than two weeks. °· You have symptoms such as weight loss, night sweats, fatigue or fever that does not go away. °· The lymph nodes are hard, seem fixed to the skin or are growing rapidly. °· Skin over the lymph nodes is red and inflamed. This could mean there is an infection. °SEEK IMMEDIATE MEDICAL CARE IF:  °· Fluid starts leaking from the area of the enlarged lymph node. °· You develop a fever of 102° F (38.9° C) or greater. °· Severe   pain develops (not necessarily at the site of a large lymph node). °· You develop chest pain or shortness of breath. °· You develop worsening abdominal pain. °MAKE SURE YOU:  °· Understand these instructions. °· Will watch your condition. °· Will get help right away if you are not doing well or get worse. °Document  Released: 01/09/2008 Document Revised: 06/24/2011 Document Reviewed: 01/09/2008 °[employer]® Patient Information ©2014 [employer]. ° °

## 2013-05-25 NOTE — Progress Notes (Signed)
Pre visit review using our clinic review tool, if applicable. No additional management support is needed unless otherwise documented below in the visit note. 

## 2013-05-25 NOTE — Progress Notes (Signed)
Patient ID: Jessica Diaz, female   DOB: 01/07/1991, 23 y.o.   MRN: [id number]   Subjective:    Patient ID: Jessica Diaz, female    DOB: 02/10/1991, 23 y.o.   MRN: [id number] HPI Pt here c/o lump L side , back of neck that is tender to touch. No fever, no recent illness.           Objective:    BP 114/74  Pulse 78  Temp(Src) 98.3 F (36.8 C) (Oral)  Wt 142 lb (64.411 kg)  SpO2 98%  LMP 05/03/2013 General appearance: alert, cooperative, appears stated age and no distress Neck: mild anterior cervical adenopathy, supple, symmetrical, trachea midline and thyroid not enlarged, symmetric, no tenderness/mass/nodules---- mildly enlarged lymph node L side of neck--- post cervical Lungs: clear to auscultation bilaterally Heart: S1, S2 normal       Assessment & Plan:  1. Lymphadenopathy Check lab --f/u 2 weeks or sooner prn - cephALEXin (KEFLEX) 500 MG capsule; Take 1 capsule (500 mg total) by mouth 2 (two) times daily.  Dispense: 20 capsule; Refill: 0 - CBC with Differential

## 2013-05-26 ENCOUNTER — Telehealth: Payer: Self-pay | Admitting: *Deleted

## 2013-05-26 NOTE — Telephone Encounter (Signed)
Patient would like WBC results. Please advise. JG//CMA

## 2013-05-26 NOTE — Telephone Encounter (Signed)
Called patient with WBC results. WBC-8.7  WNL.  Also made patient aware that her results were available on MyChart.  No further concerns regarding results.

## 2013-07-12 ENCOUNTER — Ambulatory Visit (INDEPENDENT_AMBULATORY_CARE_PROVIDER_SITE_OTHER): Payer: BC Managed Care – PPO | Admitting: Family Medicine

## 2013-07-12 ENCOUNTER — Encounter: Payer: Self-pay | Admitting: Family Medicine

## 2013-07-12 VITALS — BP 120/78 | HR 77 | Temp 97.9°F | Resp 16 | Wt 142.2 lb

## 2013-07-12 DIAGNOSIS — N644 Mastodynia: Secondary | ICD-10-CM

## 2013-07-12 NOTE — Progress Notes (Signed)
Pre visit review using our clinic review tool, if applicable. No additional management support is needed unless otherwise documented below in the visit note. 

## 2013-07-12 NOTE — Assessment & Plan Note (Signed)
New.  No obvious mass or abnormality on PE but pt very tender.  Ibuprofen for pain.  Heat.  US to assess.  Pt expressed understanding and is in agreement w/ plan.

## 2013-07-12 NOTE — Progress Notes (Signed)
   Subjective:    Patient ID: Jessica Diaz, female    DOB: 10/27/1990, 23 y.o.   MRN: [id number]  HPI Breast tenderness- noted the week before her last cycle.  R breast continues to ache.  Now painful to touch or wear bra.  Uncomfortable to lie on R side while sleeping.  No change in activity level or heavy lifting.  No pain in armpit but lateral breast.  No redness.  + swelling.  No drainage or discharge   Review of Systems For ROS see HPI     Objective:   Physical Exam  Vitals reviewed. Constitutional: She appears well-developed and well-nourished. No distress.  Pulmonary/Chest: Right breast exhibits tenderness (laterally between 8-10 o'clock). Right breast exhibits no inverted nipple, no mass, no nipple discharge and no skin change. Left breast exhibits no inverted nipple, no mass, no nipple discharge and no skin change.  Lymphadenopathy:       Right axillary: No pectoral and no lateral adenopathy present.          Assessment & Plan:

## 2013-07-12 NOTE — Patient Instructions (Signed)
Follow up as needed Start ibuprofen for breast pain Heat! We'll call you with your imaging appt Call with any questions or concerns Hang in there!

## 2013-07-19 ENCOUNTER — Encounter: Payer: Self-pay | Admitting: Family Medicine

## 2013-07-19 ENCOUNTER — Ambulatory Visit (HOSPITAL_BASED_OUTPATIENT_CLINIC_OR_DEPARTMENT_OTHER)
Admission: RE | Admit: 2013-07-19 | Discharge: 2013-07-19 | Disposition: A | Discharge status: Home / Self Care | Payer: BC Managed Care – PPO | Source: Ambulatory Visit | Attending: Family Medicine | Admitting: Family Medicine

## 2013-07-19 ENCOUNTER — Telehealth: Payer: Self-pay | Admitting: General Practice

## 2013-07-19 ENCOUNTER — Ambulatory Visit (INDEPENDENT_AMBULATORY_CARE_PROVIDER_SITE_OTHER): Payer: BC Managed Care – PPO | Admitting: Family Medicine

## 2013-07-19 ENCOUNTER — Telehealth: Payer: Self-pay

## 2013-07-19 VITALS — BP 110/72 | HR 86 | Temp 98.2°F | Resp 16 | Wt 142.0 lb

## 2013-07-19 DIAGNOSIS — M25531 Pain in right wrist: Secondary | ICD-10-CM

## 2013-07-19 DIAGNOSIS — G43909 Migraine, unspecified, not intractable, without status migrainosus: Secondary | ICD-10-CM

## 2013-07-19 DIAGNOSIS — M25539 Pain in unspecified wrist: Secondary | ICD-10-CM

## 2013-07-19 DIAGNOSIS — N644 Mastodynia: Secondary | ICD-10-CM

## 2013-07-19 MED ORDER — MELOXICAM 15 MG PO TABS: 15.0000 mg | ORAL_TABLET | Freq: Every day | ORAL | Status: DC

## 2013-07-19 NOTE — Telephone Encounter (Signed)
Message copied by Jackson LatinoYLER, Tagg Eustice L on Mon Jul 19, 2013  8:36 AM ------      Message from: Arvilla MeresAMPBELL, BRITTANY P      Created: Mon Jul 19, 2013  8:14 AM      Regarding: breast imaging order       Can you change order to US breast ltd right inc axilla and also put in order for diagnostic mammogram? Thank you :)       ------

## 2013-07-19 NOTE — Telephone Encounter (Signed)
Called with c/o R. wrist pain and mild swelling.  States that she does not have full range of motion of wrist and would like to have it check out.  Patient has a history of a superficial vein thrombosis of the right forearm.  She also has a 24 year old son.

## 2013-07-19 NOTE — Telephone Encounter (Signed)
New orders placed

## 2013-07-19 NOTE — Progress Notes (Signed)
Pre visit review using our clinic review tool, if applicable. No additional management support is needed unless otherwise documented below in the visit note. 

## 2013-07-19 NOTE — Assessment & Plan Note (Signed)
New.  Severe.  Get Xray to assess.  Start daily NSAID.  Ice.  Pt not interested in bracing at this time due to work limitations.  Depending on xray outcome may need ortho referral.  Suspect tendonitis.  Will follow.

## 2013-07-19 NOTE — Assessment & Plan Note (Signed)
Chronic problem.  [facility].  New referral entered.

## 2013-07-19 NOTE — Patient Instructions (Signed)
[facility] to the [facility] and get your [facility] done Start the Mobic daily for pain and inflammation ICE! Try and avoid heavy lifting We'll call you with your neuro appt Call with any questions or concerns Hang in there!

## 2013-07-19 NOTE — Progress Notes (Signed)
   Subjective:    Patient ID: Jessica Diaz, female    DOB: 08/28/1990, 23 y.o.   MRN: [id number]  HPI R wrist pain- husband is very concerned due to pt's hx of blood clot.  Painful to flex or extend or at times move fingers.  Has hx of ganglion cysts.  sxs started 2 weeks ago.  + edema.  No heavy lifting.  Migraine- needs new referral b/c pt[facility].  Review of Systems For ROS see HPI     Objective:   Physical Exam  Vitals reviewed. Constitutional: She appears well-developed and well-nourished. No distress.  Cardiovascular: Intact distal pulses.   Musculoskeletal: She exhibits tenderness (over dorsum of R hand). She exhibits no edema.  R wrist- pain w/ extension>flexion, TTP over center of dorsal wrist  Neurological: She has normal reflexes.          Assessment & Plan:

## 2013-07-20 ENCOUNTER — Other Ambulatory Visit: Payer: BC Managed Care – PPO

## 2013-07-23 ENCOUNTER — Ambulatory Visit
Admission: RE | Admit: 2013-07-23 | Discharge: 2013-07-23 | Disposition: A | Discharge status: Home / Self Care | Payer: BC Managed Care – PPO | Source: Ambulatory Visit | Attending: Family Medicine | Admitting: Family Medicine

## 2013-07-23 DIAGNOSIS — N644 Mastodynia: Secondary | ICD-10-CM

## 2013-07-29 ENCOUNTER — Telehealth: Payer: Self-pay | Admitting: Family Medicine

## 2013-07-29 NOTE — Telephone Encounter (Signed)
4.16.15  Pt has a bad sinus infection and is wanting to know if she can get a RX for a z-pack or something.  Please advise.  Father is contact pt so please contact [phone] if needed.

## 2013-07-29 NOTE — Telephone Encounter (Signed)
Patient requesting an antibiotic for a sinus infection.  She has not been seen for this issue, therefore she was encouraged to make an appointment.  Patient was currently at work and stated that she would call back to make the appointment.

## 2013-08-17 ENCOUNTER — Ambulatory Visit: Payer: BC Managed Care – PPO | Admitting: Neurology

## 2013-08-18 ENCOUNTER — Encounter: Payer: Self-pay | Admitting: Neurology

## 2013-09-19 ENCOUNTER — Encounter (HOSPITAL_COMMUNITY): Payer: Self-pay | Admitting: Emergency Medicine

## 2013-09-19 ENCOUNTER — Emergency Department (HOSPITAL_COMMUNITY)
Admission: EM | Admit: 2013-09-19 | Discharge: 2013-09-20 | Disposition: A | Discharge status: Home / Self Care | Payer: BC Managed Care – PPO | Attending: Emergency Medicine | Admitting: Emergency Medicine

## 2013-09-19 ENCOUNTER — Emergency Department (HOSPITAL_COMMUNITY): Payer: BC Managed Care – PPO

## 2013-09-19 DIAGNOSIS — Z3202 Encounter for pregnancy test, result negative: Secondary | ICD-10-CM | POA: Insufficient documentation

## 2013-09-19 DIAGNOSIS — Z8742 Personal history of other diseases of the female genital tract: Secondary | ICD-10-CM | POA: Insufficient documentation

## 2013-09-19 DIAGNOSIS — Z87440 Personal history of urinary (tract) infections: Secondary | ICD-10-CM | POA: Insufficient documentation

## 2013-09-19 DIAGNOSIS — Z87442 Personal history of urinary calculi: Secondary | ICD-10-CM | POA: Insufficient documentation

## 2013-09-19 DIAGNOSIS — Z90710 Acquired absence of both cervix and uterus: Secondary | ICD-10-CM | POA: Insufficient documentation

## 2013-09-19 DIAGNOSIS — R1031 Right lower quadrant pain: Secondary | ICD-10-CM | POA: Insufficient documentation

## 2013-09-19 DIAGNOSIS — Z79899 Other long term (current) drug therapy: Secondary | ICD-10-CM | POA: Insufficient documentation

## 2013-09-19 LAB — URINALYSIS, ROUTINE W REFLEX MICROSCOPIC
Bilirubin Urine: NEGATIVE
Glucose, UA: NEGATIVE mg/dL
Hgb urine dipstick: NEGATIVE
Ketones, ur: 15 mg/dL — AB
NITRITE: NEGATIVE
PH: 7 (ref 5.0–8.0)
Protein, ur: NEGATIVE mg/dL
SPECIFIC GRAVITY, URINE: 1.022 (ref 1.005–1.030)
Urobilinogen, UA: 1 mg/dL (ref 0.0–1.0)

## 2013-09-19 LAB — CBC WITH DIFFERENTIAL/PLATELET
Basophils Absolute: 0 10*3/uL (ref 0.0–0.1)
Basophils Relative: 0 % (ref 0–1)
EOS ABS: 0.1 10*3/uL (ref 0.0–0.7)
EOS PCT: 2 % (ref 0–5)
HCT: 40.2 % (ref 36.0–46.0)
HEMOGLOBIN: 13.6 g/dL (ref 12.0–15.0)
Lymphocytes Relative: 25 % (ref 12–46)
Lymphs Abs: 1.7 10*3/uL (ref 0.7–4.0)
MCH: 29.8 pg (ref 26.0–34.0)
MCHC: 33.8 g/dL (ref 30.0–36.0)
MCV: 88.2 fL (ref 78.0–100.0)
MONOS PCT: 6 % (ref 3–12)
Monocytes Absolute: 0.4 10*3/uL (ref 0.1–1.0)
Neutro Abs: 4.5 10*3/uL (ref 1.7–7.7)
Neutrophils Relative %: 67 % (ref 43–77)
PLATELETS: 191 10*3/uL (ref 150–400)
RBC: 4.56 MIL/uL (ref 3.87–5.11)
RDW: 13.2 % (ref 11.5–15.5)
WBC: 6.7 10*3/uL (ref 4.0–10.5)

## 2013-09-19 LAB — LIPASE, BLOOD: Lipase: 18 U/L (ref 11–59)

## 2013-09-19 LAB — COMPREHENSIVE METABOLIC PANEL
ALT: 9 U/L (ref 0–35)
AST: 13 U/L (ref 0–37)
Albumin: 4 g/dL (ref 3.5–5.2)
Alkaline Phosphatase: 57 U/L (ref 39–117)
BUN: 8 mg/dL (ref 6–23)
CALCIUM: 9.4 mg/dL (ref 8.4–10.5)
CO2: 24 mEq/L (ref 19–32)
Chloride: 105 mEq/L (ref 96–112)
Creatinine, Ser: 0.65 mg/dL (ref 0.50–1.10)
GFR calc non Af Amer: >90 mL/min (ref >90)
Glucose, Bld: 81 mg/dL (ref 70–99)
Potassium: 3.8 mEq/L (ref 3.7–5.3)
Sodium: 142 mEq/L (ref 137–147)
TOTAL PROTEIN: 7.5 g/dL (ref 6.0–8.3)
Total Bilirubin: 0.4 mg/dL (ref 0.3–1.2)

## 2013-09-19 LAB — URINE MICROSCOPIC-ADD ON

## 2013-09-19 LAB — PREGNANCY, URINE: Preg Test, Ur: NEGATIVE

## 2013-09-19 MED ORDER — SODIUM CHLORIDE 0.9 % IV SOLN
INTRAVENOUS | Status: DC
  Administered 2013-09-19: 21:00:00 via INTRAVENOUS

## 2013-09-19 MED ORDER — ONDANSETRON 8 MG PO TBDP: ORAL_TABLET | ORAL | Status: DC

## 2013-09-19 MED ORDER — SODIUM CHLORIDE 0.9 % IV BOLUS (SEPSIS)
1000.0000 mL | Freq: Once | INTRAVENOUS | Status: AC
  Administered 2013-09-19: 1000 mL via INTRAVENOUS

## 2013-09-19 MED ORDER — HYDROMORPHONE HCL PF 1 MG/ML IJ SOLN
1.0000 mg | Freq: Once | INTRAMUSCULAR | Status: AC
Start: 2013-09-19 — End: 2013-09-19
  Administered 2013-09-19: 1 mg via INTRAVENOUS
  Filled 2013-09-19: qty 1

## 2013-09-19 MED ORDER — ONDANSETRON HCL 4 MG/2ML IJ SOLN
4.0000 mg | Freq: Once | INTRAMUSCULAR | Status: AC
  Administered 2013-09-19: 4 mg via INTRAVENOUS
  Filled 2013-09-19: qty 2

## 2013-09-19 MED ORDER — PROMETHAZINE HCL 25 MG/ML IJ SOLN
25.0000 mg | Freq: Once | INTRAMUSCULAR | Status: AC
  Administered 2013-09-19: 25 mg via INTRAVENOUS
  Filled 2013-09-19 (×2): qty 1

## 2013-09-19 MED ORDER — HYDROCODONE-ACETAMINOPHEN 5-325 MG PO TABS: 2.0000 | ORAL_TABLET | ORAL | Status: DC | PRN

## 2013-09-19 MED ORDER — IOHEXOL 300 MG/ML  SOLN
20.0000 mL | INTRAMUSCULAR | Status: AC
  Administered 2013-09-19: 25 mL via ORAL

## 2013-09-19 MED ORDER — IOHEXOL 300 MG/ML  SOLN
100.0000 mL | Freq: Once | INTRAMUSCULAR | Status: AC | PRN
  Administered 2013-09-19: 100 mL via INTRAVENOUS

## 2013-09-19 MED ORDER — METOCLOPRAMIDE HCL 10 MG PO TABS: 10.0000 mg | ORAL_TABLET | Freq: Four times a day (QID) | ORAL | Status: DC | PRN

## 2013-09-19 NOTE — ED Notes (Signed)
Pt states increased nausea.  Order placed for Phenergan and and pharmacy notified

## 2013-09-19 NOTE — ED Notes (Signed)
Dr. Bednar at bedside. 

## 2013-09-19 NOTE — ED Notes (Signed)
Pt c/o right lower quadrant abdominal pain with nausea onset yesterday. Pt denies change in diet.

## 2013-09-19 NOTE — Discharge Instructions (Signed)
Abdominal (belly) pain can be caused by many things. Your caregiver performed an examination and possibly ordered blood/urine tests and imaging (CT scan, x-rays, ultrasound). Many cases can be observed and treated at home after initial evaluation in the emergency department. Even though you are being discharged home, abdominal pain can be unpredictable. Therefore, you need a repeated exam if your pain does not resolve, returns, or worsens. Most patients with abdominal pain don't have to be admitted to the hospital or have surgery, but serious problems like appendicitis and gallbladder attacks can start out as nonspecific pain. Many abdominal conditions cannot be diagnosed in one visit, so follow-up evaluations are very important. °SEEK IMMEDIATE MEDICAL ATTENTION IF: °The pain does not go away or becomes severe.  °A temperature above 101 develops.  °Repeated vomiting occurs (multiple episodes).   °Blood is being passed in stools or vomit (bright red or black tarry stools).  °Return also if you develop chest pain, difficulty breathing, dizziness or fainting, or become confused, poorly responsive, or inconsolable (young children). °

## 2013-09-19 NOTE — ED Provider Notes (Signed)
CSN: [id number]     Arrival date & time 09/19/13  1616 History   First MD Initiated Contact with Patient 09/19/13 1833     Chief Complaint  Patient presents with  . Abdominal Pain     (Consider location/radiation/quality/duration/timing/severity/associated sxs/prior Treatment) HPI 24 hours well localized gradual onset gradually worsening moderately severe right lower quadrant aching abdominal pain without radiation or associated symptoms other than nausea but she has had no vomiting no diarrhea no vaginal bleeding no vaginal discharge no dysuria no back pain no chest pain no shortness of breath but does have anorexia with last oral intake attempted in small bites of food at 1:00 this afternoon and stopped eating because it made her feel worse. She is moderately severe right lower quadrant pain worse with any movement or palpation. Past Medical History  Diagnosis Date  . Endometriosis 2008  . Kidney stones   . UTI (lower urinary tract infection)   . PONV (postoperative nausea and vomiting)   . Family history of anesthesia complication     mother and father have N/V  . History of blood clots     blood clot in right arm post renal stone surgery 01/12/13-on blood thinnners   Past Surgical History  Procedure Laterality Date  . Left knee surgery Left 2007  . Laparoscopic endometriosis fulguration  2008  . Wisdom tooth extraction  2013    two lowers and one upper  . Cystoscopy with retrograde pyelogram, ureteroscopy and stent placement Right 01/12/2013    Procedure: CYSTOSCOPY WITH RETROGRADE PYELOGRAM, URETEROSCOPY AND STENT PLACEMENT;  Surgeon: Valetta Fuller, MD;  Location: [facility];  Service: Urology;  Laterality: Right;  with stone basket extraction   Family History  Problem Relation Age of Onset  . Breast cancer Mother 53  . Lung cancer Other     paternal side  . Diabetes Maternal Grandmother   . Gestational diabetes Mother   . Heart disease Father   . Hypertension Mother   .  Hypertension Father   . Hyperlipidemia Mother   . Hyperlipidemia Father    History  Substance Use Topics  . Smoking status: Never Smoker   . Smokeless tobacco: Never Used     Comment: never used product  . Alcohol Use: No   OB History   Grav Para Term Preterm Abortions TAB SAB Ect Mult Living   1 1 1       1      Review of Systems 10 Systems reviewed and are negative for acute change except as noted in the HPI.   Allergies  Pineapple; Codeine; Decongestant; and Percocet  Home Medications   Prior to Admission medications   Medication Sig Start Date End Date Taking? Authorizing Provider  acetaminophen (TYLENOL) 325 MG tablet Take 975 mg by mouth every 6 (six) hours as needed for moderate pain.   Yes Historical Provider, MD  Biotin 1000 MCG tablet Take 1,000 mcg by mouth daily.    Yes Historical Provider, MD  HYDROcodone-acetaminophen (NORCO) 5-325 MG per tablet Take 2 tablets by mouth every 4 (four) hours as needed. 09/19/13   Hurman Horn, MD  metoCLOPramide (REGLAN) 10 MG tablet Take 1 tablet (10 mg total) by mouth every 6 (six) hours as needed for nausea (nausea/headache). 09/19/13   Hurman Horn, MD  ondansetron (ZOFRAN ODT) 8 MG disintegrating tablet 8mg  ODT q4 hours prn nausea 09/19/13   Hurman Horn, MD   BP 125/87  Pulse 81  Temp(Src) 97.7 F (36.5  C) (Oral)  Resp 18  Ht 5\' 5"  (1.651 m)  Wt 135 lb (61.236 kg)  BMI 22.47 kg/m2  SpO2 99%  LMP 08/27/2013 Physical Exam  Nursing note and vitals reviewed. Constitutional:  Awake, alert, nontoxic appearance.  HENT:  Head: Atraumatic.  Eyes: Right eye exhibits no discharge. Left eye exhibits no discharge.  Neck: Neck supple.  Cardiovascular: Normal rate and regular rhythm.   No murmur heard. Pulmonary/Chest: Effort normal and breath sounds normal. No respiratory distress. She has no wheezes. She has no rales. She exhibits no tenderness.  Abdominal: Soft. Bowel sounds are normal. She exhibits no distension and no mass.  There is tenderness. There is rebound and guarding.  Right lower quadrant percussion tenderness with localized rebound tenderness  Genitourinary:  Chaperone present for bimanual examination no cervical motion tenderness no left-sided adnexal tenderness does have some mild right-sided adnexal tenderness with abdominal exam palpation but not with internal exam with no discharge or blood is noted on examination glove  Musculoskeletal: She exhibits no tenderness.  Baseline ROM, no obvious new focal weakness.  Neurological: She is alert.  Mental status and motor strength appears baseline for patient and situation.  Skin: No rash noted.  Psychiatric: She has a normal mood and affect.    ED Course  Procedures (including critical care time) D/w Byer[facility]CS recs discharge to f/u PCP since CT shows normal appendix with suspected mesenteric adenitis. 2310 Patient / Family / Caregiver informed of clinical course, understand medical decision-making process, and agree with plan.  Labs Review Labs Reviewed  URINALYSIS, ROUTINE W REFLEX MICROSCOPIC - Abnormal; Notable for the following:    Ketones, ur 15 (*)    Leukocytes, UA TRACE (*)    All other components within normal limits  URINE MICROSCOPIC-ADD ON - Abnormal; Notable for the following:    Squamous Epithelial / LPF FEW (*)    All other components within normal limits  CBC WITH DIFFERENTIAL  COMPREHENSIVE METABOLIC PANEL  PREGNANCY, URINE  LIPASE, BLOOD  POC URINE PREG, ED    Imaging Review Ct Abdomen Pelvis W Contrast  09/19/2013   CLINICAL DATA:  Lower abdominal pain with nausea  EXAM: CT ABDOMEN AND PELVIS WITH CONTRAST  TECHNIQUE: Multidetector CT imaging of the abdomen and pelvis was performed using the standard protocol following bolus administration of intravenous contrast. Oral contrast was also administered.  CONTRAST:  OMNIPAQUE IOHEXOL 300 MG/ML  SOLN  COMPARISON:  January 11, 2013  FINDINGS: Lung bases are clear.  The liver  is enlarged, measuring 19.4 cm in length. No focal liver lesions are identified. Gallbladder wall is not appreciably thickened. There is no biliary duct dilatation.  Spleen, pancreas, and adrenals appear normal. Kidneys bilaterally show no mass or hydronephrosis on either side. There are two, 1 mm nonobstructing calculi in the upper pole of the right kidney. There is no ureteral calculus or ureterectasis on either side.  In the pelvis, the urinary bladder is midline. There is a left ovarian cyst measuring 3.4 x 2.8 cm. There is no other pelvic mass. There is no appreciable free pelvic fluid. The appendix appears within normal limits. There are a few subcentimeter mesenteric lymph nodes in the right lower quadrant region.  There is no bowel obstruction. There is no free air or portal venous air. There is no ascites, abscess, or adenopathy by size criteria in the abdomen or pelvis. There is no evidence of abdominal aortic aneurysm. There are no blastic or lytic bone lesions.  IMPRESSION:  There is a 3.4 x 2.8 cm left ovarian cyst. There is no free pelvic fluid.  Appendix appears normal.  No bowel obstruction.  No abscess.  There are several small lymph nodes in the right lower quadrant. In the appropriate clinical setting, this finding could indicate early mesenteric adenitis.  Liver is enlarged but otherwise unremarkable appearance.  There are two, 1 mm nonobstructing calculi in the upper pole of the right kidney. No hydronephrosis on either side. No ureteral calculi identified on either side.   Electronically Signed   By: Bretta BangWilliam  Woodruff M.D.   On: 09/19/2013 21:38     EKG Interpretation None      MDM   Final diagnoses:  Right lower quadrant abdominal pain    I doubt any other EMC precluding discharge at this time including, but not necessarily limited to the following:SBI, appendicitis (suspect appy mimic with mesenteric adenitis).    Hurman HornJohn M Dezyrae Kensinger, MD 09/21/13 [phone]

## 2013-09-27 ENCOUNTER — Telehealth: Payer: Self-pay | Admitting: Obstetrics & Gynecology

## 2013-09-27 DIAGNOSIS — D689 Coagulation defect, unspecified: Secondary | ICD-10-CM

## 2013-09-27 NOTE — Telephone Encounter (Signed)
Dr. Hyacinth MeekerMiller, message from you 04/2013 is patient should see maternal fetal medicine prior to trying to conceive. Does patient need office visit with you or referral to what provider?

## 2013-09-27 NOTE — Telephone Encounter (Signed)
Patient calling for recommendations for OB's who can follow her pregnancy. She is not pregnant yet but to plans to start trying to get pregnant and was told to let Dr. Hyacinth MeekerMiller know when she is ready. She wants an MD who specializes in patients who have blood clots and take blood thinners. Please advise?

## 2013-09-28 NOTE — Telephone Encounter (Signed)
Referral entered to MFM in EPIC.  Doesn't matter who sees her there.  [facility].

## 2013-09-28 NOTE — Telephone Encounter (Signed)
Patient is scheduled for 10/01/13 at 11:00 am with [facility]car [facility]. She is agreeable to this appointment.

## 2013-10-01 ENCOUNTER — Ambulatory Visit (HOSPITAL_COMMUNITY)
Admission: RE | Admit: 2013-10-01 | Discharge: 2013-10-01 | Disposition: A | Discharge status: Home / Self Care | Payer: BC Managed Care – PPO | Source: Ambulatory Visit | Attending: Obstetrics & Gynecology | Admitting: Obstetrics & Gynecology

## 2013-10-01 DIAGNOSIS — Z86718 Personal history of other venous thrombosis and embolism: Secondary | ICD-10-CM | POA: Insufficient documentation

## 2013-10-01 DIAGNOSIS — Z3169 Encounter for other general counseling and advice on procreation: Secondary | ICD-10-CM

## 2013-10-01 NOTE — ED Notes (Signed)
BP-138/82, P-82, Wt.-136lb

## 2013-10-01 NOTE — Consult Note (Signed)
Maternal Fetal Medicine Consultation  Requesting Provider(s): Annamaria BootsMary Suzanne Miller, MD  Reason for consultation: Preconception counseling, hx of right arm thrombosis  HPI: Jessica Diaz is a 23 yo G1P1001 seen for preconception counseling due to a history of a right arm superficial thrombosis.  The patient reports that she underwent kidney stone surgery in Sept 2014 - feels that she developed a phlebitis in response to IV phenergan during that procedure.  The patient later noted to have pain, redness and swelling of the right arm and was subsequently diagnosed with a right arm superficial thrombosis.  She was seen by Dr. Myna HidalgoEnnever (Hematology).  The initial thrombophilia work up returned positive for lupus anticoagulant, but confirmatory tests x 2 were negative.  She was treated with Xarelto for 3 months and has since been taking a baby aspirin daily.  Hematology recommends prophylactic Lovenox during pregnancy for this patient.  Jessica Diaz reports a history of kidney stones that were diagnosed during her last pregnancy - she otherwise denies other medical issues.  She is without complaints today.  OB History: OB History   Grav Para Term Preterm Abortions TAB SAB Ect Mult Living   1 1 1       1       PMH:  Past Medical History  Diagnosis Date  . Endometriosis 2008  . Kidney stones   . UTI (lower urinary tract infection)   . PONV (postoperative nausea and vomiting)   . Family history of anesthesia complication     mother and father have N/V  . History of blood clots     blood clot in right arm post renal stone surgery 01/12/13-on blood thinnners    PSH:  Past Surgical History  Procedure Laterality Date  . Left knee surgery Left 2007  . Laparoscopic endometriosis fulguration  2008  . Wisdom tooth extraction  2013    two lowers and one upper  . Cystoscopy with retrograde pyelogram, ureteroscopy and stent placement Right 01/12/2013    Procedure: CYSTOSCOPY WITH RETROGRADE PYELOGRAM,  URETEROSCOPY AND STENT PLACEMENT;  Surgeon: Valetta Fulleravid S Grapey, MD;  Location: [facility];  Service: Urology;  Laterality: Right;  with stone basket extraction   Meds:  Claritin Prenatal vitamins Biotin / vitamin B Asprin daily  Allergies:  Allergies  Allergen Reactions  . Pineapple Anaphylaxis  . Codeine     Makes blood pressure rise   . Decongestant [Oxymetazoline]     Makes blood pressure drop   . Percocet [Oxycodone-Acetaminophen]     Hallucinate    FH:  Family History  Problem Relation Age of Onset  . Breast cancer Mother 2036  . Lung cancer Other     paternal side  . Diabetes Maternal Grandmother   . Gestational diabetes Mother   . Heart disease Father   . Hypertension Mother   . Hypertension Father   . Hyperlipidemia Mother   . Hyperlipidemia Father   Denies a family history of birth defects or hereditary disorders  Soc:  History   Social History  . Marital Status: Married    Spouse Name: N/A    Number of Children: N/A  . Years of Education: N/A   Occupational History  . Not on file.   Social History Main Topics  . Smoking status: Never Smoker   . Smokeless tobacco: Never Used     Comment: never used product  . Alcohol Use: No  . Drug Use: No  . Sexual Activity: Yes    Partners: Male  Other Topics Concern  . Not on file   Social History Narrative  . No narrative on file    A/P: 1) History of right arm superficial thrombosis - initial lupus anticoagulant returned positive, but subsequent labs negative.  The patient does not give any other clinical or laboratory evidence to support antiphospholipid antibody syndrome.  Given the patient's history, would concur with Hematology - recommend Lovenox 40 mg daily from the time of conception through 6 weeks post partum.  Since the patient does not meet full criteria for antiphospholipid antibody syndrome, would discontinue aspirin after conception (if on Lovenox).  Recommendations: 1) Lovenox (prophylactic  dose) as described above 2) Serial growth scans during the 3rd trimester.  If lagging growth is noted, would also recommend 2x weekly NSTs with weekly AFIs starting at 32 weeks 3) Would continue Lovenox throughout the course of pregnancy and 6 weeks post partum.  Would recommend a scheduled induction at 39-40 weeks (in the absence of other complications) - hold dose of Lovenox the morning of the scheduled induction to allow for Epidural anesthesia. 4) Continue prenatal vitamins for folic acid supplementation while trying to conceive   Thank you for the opportunity to be a part of the care of Jessica Diaz. Please contact our office if we can be of further assistance.   I spent approximately 30 minutes with this patient with over 50% of time spent in face-to-face counseling.  Jessica GulaPaul Kaleiyah Polsky, MD Maternal Fetal Medicine

## 2013-10-04 ENCOUNTER — Telehealth: Payer: Self-pay | Admitting: Obstetrics & Gynecology

## 2013-10-04 NOTE — Telephone Encounter (Signed)
Spoke with patient. Advised of message from Dr.Miller as seen below. Patient agreeable. Patient states that she would [facility] and see if they take her insurance. Will call back if they do not for another recommendation.  Routing to provider for final review. Patient agreeable to disposition. Will close encounter

## 2013-10-04 NOTE — Telephone Encounter (Signed)
Dr.Miller, patient [facility] Patient is calling for referral to OB at this time. Who do you recommend for the patient to see?

## 2013-10-04 NOTE — Telephone Encounter (Signed)
She doesn't really need a referral. She can call and schedule.

## 2013-10-04 NOTE — Telephone Encounter (Signed)
[facility].  Dr. Billy Coastaavon, Dr. Cherly Hensenousins would both be good for her.

## 2013-10-04 NOTE — Telephone Encounter (Deleted)
Patient saw Dr. Claudean SeveranceWhitecar at MFM on 10/01/13.

## 2013-10-04 NOTE — Telephone Encounter (Signed)
Patient calling to speak with nurse about Dr. Rondel BatonMiller's recommendations for an OB. She is not pregnant yet but has some concerns.

## 2013-10-27 ENCOUNTER — Encounter: Payer: Self-pay | Admitting: Obstetrics & Gynecology

## 2013-11-01 ENCOUNTER — Telehealth: Payer: Self-pay | Admitting: Family Medicine

## 2013-11-01 NOTE — Telephone Encounter (Signed)
Called pt and lmovm to advise that she would either need an appt or needs to elaborate to other staff here about what the issue is.

## 2013-11-01 NOTE — Telephone Encounter (Signed)
Patient Information:  Caller Name: Jon Gillslexis  Phone: [phone]  Patient: Jessica Diaz, Jessica Diaz  Gender: Female  DOB: 12/15/1990  Age: 23 Years  PCP: Sheliah Hatchabori, Katherine E.  Pregnant: No  Office Follow Up:  Does the office need to follow up with this patient?: Yes  Instructions For The Office: Offered  2:00 pm with Dr Beverely Lowabori but p;atient not accepting this appointment. Patient needing help, guidance -- [place] and will need to be back again at 5:30 pm for another appointment. Please review and contact patient at [phone].   Symptoms  Reason For Call & Symptoms: Nose bleeds started Sun 7/12.  Has continued with nose bleeds on Monday 7/13, Tuesday 7/14, Wednesday 7/15, during the weekend 7/18-19.  Bleeding usually stopping with 5-10 minutes of pinching nose but has had as many as 4-5 bleeds a day.  Today 7/20 nose bleed was the worse, passing clots, thinks she has lost a lot of blood. Today took about 15 minutes to stop the bleeding.   Notes last time this happened she was pregnant but not expecting pregnancy this time.  Still on light period.  Reviewed Health History In EMR: Yes  Reviewed Medications In EMR: Yes  Reviewed Allergies In EMR: Yes  Reviewed Surgeries / Procedures: Yes  Date of Onset of Symptoms: 10/24/2013  Treatments Tried: pinching nose tightly  Treatments Tried Worked: Yes OB / GYN:  LMP: 10/24/2013  Guideline(s) Used:  Nosebleed  Disposition Per Guideline:   Go to Office Now  Reason For Disposition Reached:   Large amount of blood has been lost (e.g., one cup)  Advice Given:  N/A  Patient Will Follow Care Advice:  YES

## 2013-11-01 NOTE — Telephone Encounter (Signed)
Caller name: Jon Gillslexis  Call back [phone]   Reason for call:  Pt is having nose bleed issues and wants to speak with Dr. Rennis Goldenabori's RN or CMA only. Would not give a message beyond that.

## 2013-11-01 NOTE — Telephone Encounter (Signed)
Encounter closed due to CAN telephone note.

## 2013-11-01 NOTE — Telephone Encounter (Signed)
Received message for C-A-N.  Please see below. Called patient and patient reported that same as mentioned in C-A-N message.    Pt also reported the following: Headache---constant ache--frontal lobe-- 4/10. No dizziness.  Has been dizzy for the past couple days due to headaches.   Nauseated--blood dripping into stomach from nose bleeds.   Nose bleed--Nose bleed has stopped.  Large amt of blood loss with clots.   Pt unable to come in for an appointment today due to other appointments [place].  Spoke with Dr. Beverely Lowabori and made her aware of situation.  Dr. Beverely Lowabori advised that symptoms may be sinus related, therefore, her recommendations were take OTC Claritin, Zyrtec, or comparable sinus medication.  Go immediately to UC or ER if nosebleed returns and fails to stop bleeding. Office appointment if headache not relieved with sinus medication.  This information was relayed to the patient.  Pt stated understanding and agreed with plan.  No further questions or concerns were voiced.

## 2014-01-20 ENCOUNTER — Encounter: Payer: Self-pay | Admitting: *Deleted

## 2014-01-20 NOTE — Progress Notes (Signed)
Patient has confirmed pregnancy from her OB/GN office and Dr. Myna Hidalgoennever states patient needs to be seen by him next week to start anticoagulants.  Message sent to scheduler

## 2014-01-22 ENCOUNTER — Inpatient Hospital Stay (HOSPITAL_COMMUNITY)
Admission: AD | Admit: 2014-01-22 | Discharge: 2014-01-22 | Disposition: A | Discharge status: Home / Self Care | Payer: BC Managed Care – PPO | Source: Ambulatory Visit | Attending: Obstetrics | Admitting: Obstetrics

## 2014-01-22 ENCOUNTER — Encounter (HOSPITAL_COMMUNITY): Payer: Self-pay

## 2014-01-22 ENCOUNTER — Inpatient Hospital Stay (HOSPITAL_COMMUNITY): Payer: BC Managed Care – PPO

## 2014-01-22 DIAGNOSIS — R1031 Right lower quadrant pain: Secondary | ICD-10-CM | POA: Diagnosis present

## 2014-01-22 DIAGNOSIS — O3481 Maternal care for other abnormalities of pelvic organs, first trimester: Secondary | ICD-10-CM | POA: Insufficient documentation

## 2014-01-22 DIAGNOSIS — Z86718 Personal history of other venous thrombosis and embolism: Secondary | ICD-10-CM | POA: Diagnosis not present

## 2014-01-22 DIAGNOSIS — O9989 Other specified diseases and conditions complicating pregnancy, childbirth and the puerperium: Secondary | ICD-10-CM | POA: Insufficient documentation

## 2014-01-22 DIAGNOSIS — Z3201 Encounter for pregnancy test, result positive: Secondary | ICD-10-CM

## 2014-01-22 DIAGNOSIS — N831 Corpus luteum cyst of ovary, unspecified side: Secondary | ICD-10-CM

## 2014-01-22 DIAGNOSIS — Z3A01 Less than 8 weeks gestation of pregnancy: Secondary | ICD-10-CM | POA: Diagnosis not present

## 2014-01-22 LAB — WET PREP, GENITAL
Clue Cells Wet Prep HPF POC: NONE SEEN
Trich, Wet Prep: NONE SEEN

## 2014-01-22 LAB — URINALYSIS, ROUTINE W REFLEX MICROSCOPIC
BILIRUBIN URINE: NEGATIVE
GLUCOSE, UA: NEGATIVE mg/dL
HGB URINE DIPSTICK: NEGATIVE
Ketones, ur: NEGATIVE mg/dL
Leukocytes, UA: NEGATIVE
Nitrite: NEGATIVE
PH: 7 (ref 5.0–8.0)
Protein, ur: NEGATIVE mg/dL
Specific Gravity, Urine: 1.015 (ref 1.005–1.030)
Urobilinogen, UA: 0.2 mg/dL (ref 0.0–1.0)

## 2014-01-22 LAB — POCT PREGNANCY, URINE: Preg Test, Ur: POSITIVE — AB

## 2014-01-22 LAB — HCG, QUANTITATIVE, PREGNANCY: hCG, Beta Chain, Quant, S: 4160 m[IU]/mL — ABNORMAL HIGH (ref <5)

## 2014-01-22 MED ORDER — ACETAMINOPHEN 500 MG PO TABS: 1000.0000 mg | ORAL_TABLET | Freq: Four times a day (QID) | ORAL | Status: DC | PRN

## 2014-01-22 NOTE — MAU Note (Signed)
Pt states LMP-12/21/2013, lower abd pain more on r side.

## 2014-01-22 NOTE — MAU Provider Note (Signed)
History     CSN: [id number]  Arrival date and time: 01/22/14 1351 Notified of pt arrival @1435  Provider at bedside @1445   None     Chief Complaint  Patient presents with  . Abdominal Pain   HPI Comments: G2P1001 @[redacted]w[redacted]d  by LMP c/o constant RLQ pain x2 days and worsening today. Cramping x2 weeks, no VB. No constipation or diarrhea. No N/V. No fever or chills. No vaginal discharge. No dysuria, hematuria, polyuria, or flank pain. H/o DVT in rt upper arm last yr (2014) after surgery for nephrolithiasis. Followed by hematology. Coagulopathy work-up completed (per pt) but no record on file. Primary OB provider is Dr. Billy Diaz, although new to practice and not been seen yet. Quant in office 2 days ago 1660.   OB History   Grav Para Term Preterm Abortions TAB SAB Ect Mult Living   2 1 1       1       Past Medical History  Diagnosis Date  . Endometriosis 2008  . Kidney stones   . UTI (lower urinary tract infection)   . PONV (postoperative nausea and vomiting)   . Family history of anesthesia complication     mother and father have N/V  . History of blood clots     blood clot in right arm post renal stone surgery 01/12/13-on blood thinnners    Past Surgical History  Procedure Laterality Date  . Left knee surgery Left 2007  . Laparoscopic endometriosis fulguration  2008  . Wisdom tooth extraction  2013    two lowers and one upper  . Cystoscopy with retrograde pyelogram, ureteroscopy and stent placement Right 01/12/2013    Procedure: CYSTOSCOPY WITH RETROGRADE PYELOGRAM, URETEROSCOPY AND STENT PLACEMENT;  Surgeon: Valetta Fulleravid S Grapey, MD;  Location: [facility];  Service: Urology;  Laterality: Right;  with stone basket extraction    Family History  Problem Relation Age of Onset  . Breast cancer Mother 7036  . Lung cancer Other     paternal side  . Diabetes Maternal Grandmother   . Gestational diabetes Mother   . Heart disease Father   . Hypertension Mother   . Hypertension Father   .  Hyperlipidemia Mother   . Hyperlipidemia Father     History  Substance Use Topics  . Smoking status: Never Smoker   . Smokeless tobacco: Never Used     Comment: never used product  . Alcohol Use: No    Allergies:  Allergies  Allergen Reactions  . Pineapple Anaphylaxis  . Codeine Other (See Comments)    Pt states this medication causes her bp to rise.    Jessica Diaz [Oxymetazoline] Other (See Comments)    Pt states this medication causes her bp to drop.    Marland Kitchen. Percocet [Oxycodone-Acetaminophen] Other (See Comments)    Pt states this medication causes her to hallucinate.    . Latex Rash    Prescriptions prior to admission  Medication Sig Dispense Refill  . acetaminophen (TYLENOL) 500 MG tablet Take 500 mg by mouth every 6 (six) hours as needed for headache.      . Prenatal Vit-Fe Fumarate-FA (PRENATAL MULTIVITAMIN) TABS tablet Take 1 tablet by mouth daily.        Review of Systems  Constitutional: Negative.   HENT: Negative.   Eyes: Negative.   Respiratory: Negative.   Cardiovascular: Negative.   Gastrointestinal: Positive for abdominal pain.  Genitourinary: Negative.   Musculoskeletal: Negative.   Skin: Negative.   Neurological: Negative.   Endo/Heme/Allergies: Negative.  Psychiatric/Behavioral: Negative.    Physical Exam   Height 5\' 4"  (1.626 m), weight 61.349 kg (135 lb 4 oz), last menstrual period 12/21/2013.  Physical Exam  Constitutional: She is oriented to person, place, and time. She appears well-developed and well-nourished.  HENT:  Head: Normocephalic and atraumatic.  Neck: Normal range of motion. Neck supple.  Cardiovascular: Normal rate and regular rhythm.   Respiratory: Effort normal and breath sounds normal.  GI: Soft. Bowel sounds are normal. She exhibits no distension and no mass. There is tenderness. There is no rebound and no guarding.  RLQ  Genitourinary: Vagina normal and uterus normal.  Speculum: thin white discharge, wet prep  obtained, GC/CMT cultures obtained, ectropion noted Bimanual: smooth, anteverted, no adnexal mass, right adnexal tenderness, no CMT  Musculoskeletal: Normal range of motion.  Neurological: She is alert and oriented to person, place, and time. She has normal reflexes.  Skin: Skin is warm and dry.  Psychiatric: She has a normal mood and affect.  UA-WNL Quant- 4160 TVUS- single IUGS measuring 1070w0d , no FP, no YS, 2cmx1cmx2cm CLC on right,  left adnexa normal; 1cmx1cm posterior fibroid, no FF Wet prep-neg  MAU Course  Procedures  MDM Quant HCG TVUS Wet prep GC/CMT cultures  Assessment and Plan  8254w4d gestation RLQ pain CLC Pregnancy confirmation  Discharge home Heating pad to RLQ Warm baths prn pain Tylenol 1000 mg po q 6 hrs prn pain SAB precautions Follow-up in 1 week for viability sono with Dr. Billy Diaz Follow-up with Dr. Myna Diaz (hematologist) in 2 days as scheduled  Jessica Diaz, N 01/22/2014, 3:35 PM

## 2014-01-22 NOTE — Discharge Instructions (Signed)

## 2014-01-24 ENCOUNTER — Encounter: Payer: Self-pay | Admitting: Hematology & Oncology

## 2014-01-24 ENCOUNTER — Ambulatory Visit (HOSPITAL_BASED_OUTPATIENT_CLINIC_OR_DEPARTMENT_OTHER): Payer: BC Managed Care – PPO | Admitting: Hematology & Oncology

## 2014-01-24 VITALS — BP 119/78 | HR 79 | Temp 97.5°F | Resp 14 | Ht 64.0 in | Wt 136.0 lb

## 2014-01-24 DIAGNOSIS — O09891 Supervision of other high risk pregnancies, first trimester: Secondary | ICD-10-CM

## 2014-01-24 DIAGNOSIS — Z86718 Personal history of other venous thrombosis and embolism: Secondary | ICD-10-CM

## 2014-01-24 DIAGNOSIS — I808 Phlebitis and thrombophlebitis of other sites: Secondary | ICD-10-CM

## 2014-01-24 LAB — GC/CHLAMYDIA PROBE AMP
CT PROBE, AMP APTIMA: NEGATIVE
GC Probe RNA: NEGATIVE

## 2014-01-24 MED ORDER — ENOXAPARIN SODIUM 40 MG/0.4ML ~~LOC~~ SOLN: 40.0000 mg | SUBCUTANEOUS | Status: DC

## 2014-01-24 NOTE — Progress Notes (Signed)
Hematology and Oncology Follow Up Visit  Jessica Diaz [id number] 09/24/1990 22 y.o. 01/24/2014   Principle Diagnosis:  Superficial thrombus of the right forearm. First trimester pregnancy   Current Therapy:    Patient start Lovenox 40 mg subcutaneous daily     Interim History:  Jessica Diaz is back for followup. This is an unscheduled visit. She is she's been pregnant for about 4 weeks. She the past history of a thrombus in the right forearm. She had a transient positive lupus anticoagulant.  I felt that she probably benefit from Lovenox to her pregnancy to prevent a DVT from happening.  So far, she is doing okay. She is still working. She has to carry 5 gallon buckets of ice. I would don't think this is a good idea for her since she is pregnant now and will be on blood thinner. She'll have to probably talk to her obstetrician about this.  She has no abdominal pain last week. I think she had an ovarian cyst on the right side. This appears to be better.. She's having no problems going to the bathroom. There's no leg swelling. She's had no cough. There is no shortness of breath. She's had no headache. She's had occasional headaches but nothing out of the ordinary. She has some morning sickness but does not take anything for this.  She is due on 09/27/2058.  Medications: Current outpatient prescriptions:acetaminophen (TYLENOL) 500 MG tablet, Take 2 tablets (1,000 mg total) by mouth every 6 (six) hours as needed (pain)., Disp: 30 tablet, Rfl: 0;  Prenatal Vit-Fe Fumarate-FA (PRENATAL MULTIVITAMIN) TABS tablet, Take 1 tablet by mouth daily., Disp: , Rfl: ;  enoxaparin (LOVENOX) 40 MG/0.4ML injection, Inject 0.4 mLs (40 mg total) into the skin daily., Disp: 30 Syringe, Rfl: 9  Allergies:  Allergies  Allergen Reactions  . Pineapple Anaphylaxis  . Codeine Other (See Comments)    Pt states this medication causes her bp to rise.    Jessica Diaz [Oxymetazoline] Other (See Comments)    Pt  states this medication causes her bp to drop.    Marland Kitchen. Percocet [Oxycodone-Acetaminophen] Other (See Comments)    Pt states this medication causes her to hallucinate.    . Latex Rash    Past Medical History, Surgical history, Social history, and Family History were reviewed and updated.  Review of Systems: As above  Physical Exam:  height is 5\' 4"  (1.626 m) and weight is 136 lb (61.689 kg). Her oral temperature is 97.5 F (36.4 C). Her blood pressure is 119/78 and her pulse is 79. Her respiration is 14.   Jessica Diaz. Head and neck exam shows no ocular or oral lesions. She has no palpable cervical or supraclavicular lymph nodes. Lungs are clear. Cardiac exam regular in rhythm with no murmurs, rubs or bruits. Abdomen is soft. She has good bowel sounds. There is no obvious fluid wave. There is no guarding or rebound tenderness. There is no palpable liver or spleen tip. Back exam shows no tenderness over the spine, ribs or hips. Extremities shows no clubbing, cyanosis or edema. She's good range most of her joints. She has good strength in her extremities. Skin exam shows no rashes, ecchymosis or petechia. Neurological exam shows no focal neurological deficits.  Lab Results  Component Value Date   WBC 6.7 09/19/2013   HGB 13.6 09/19/2013   HCT 40.2 09/19/2013   MCV 88.2 09/19/2013   PLT 191 09/19/2013     Chemistry  Component Value Date/Time   NA 142 09/19/2013 1835   K 3.8 09/19/2013 1835   CL 105 09/19/2013 1835   CO2 24 09/19/2013 1835   BUN 8 09/19/2013 1835   CREATININE 0.65 09/19/2013 1835      Component Value Date/Time   CALCIUM 9.4 09/19/2013 1835   ALKPHOS 57 09/19/2013 1835   AST 13 09/19/2013 1835   ALT 9 09/19/2013 1835   BILITOT 0.4 09/19/2013 1835         Impression and Plan: Jessica Diaz is a 23 year old white Diaz with her second pregnancy. Her first pregnancy was 3 years ago. She had no problems with this pregnancy. There she developed a superficial thrombus  in the right forearm last year. She was on Xarelto for 3 months. Followup Doppler showed no residual thrombus.  Because of her pregnancy, I probably would have her on Lovenox at 40 mg a day subcutaneous. I think this would be reasonable so that she does not develop a deep thromboembolic event during her pregnancy or afterwards.  I told her and her husband for about 45 minutes. She understands why she needs to be on Lovenox. We will show her how to do the injections.  She will need to be on heparin injections, twice a day, for the last 3 weeks of her pregnancy. This is in the event that she all of a sudden has her delivery and these have epidural done.  We will at need to have her on Lovenox for about 6 weeks after her delivery help prevent postpartum DVT.  I gave her a prescription. She will make an appointment to come in tomorrow to have instruction from the nurses.  Again, I told her that she does not need to be carrying 5 gallon buckets of ice. She will talk to her obstetrician about this. 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  We will get her back to see us in about one month. If all looks good in one month, then we probably can get her back next year, probably sometime in February to see how she is doing and then follow her more closely.  Josph MachoENNEVER,PETER R, MD 10/12/20155:42 PM

## 2014-01-25 ENCOUNTER — Ambulatory Visit: Payer: BC Managed Care – PPO

## 2014-01-25 ENCOUNTER — Telehealth: Payer: Self-pay | Admitting: Hematology & Oncology

## 2014-01-25 NOTE — Telephone Encounter (Signed)
Per Maxine Glennn Nikki to cx 01/25/14 apt

## 2014-01-26 ENCOUNTER — Ambulatory Visit: Payer: BC Managed Care – PPO | Admitting: Hematology & Oncology

## 2014-02-01 ENCOUNTER — Telehealth: Payer: Self-pay | Admitting: Hematology & Oncology

## 2014-02-01 NOTE — Telephone Encounter (Signed)
[facility]  [facility] P: [phone] F: [phone]       COPY SCANNED

## 2014-02-14 ENCOUNTER — Encounter: Payer: Self-pay | Admitting: Hematology & Oncology

## 2014-02-21 LAB — OB RESULTS CONSOLE HEPATITIS B SURFACE ANTIGEN: Hepatitis B Surface Ag: NEGATIVE

## 2014-02-21 LAB — OB RESULTS CONSOLE ABO/RH: RH Type: POSITIVE

## 2014-02-21 LAB — OB RESULTS CONSOLE ANTIBODY SCREEN: Antibody Screen: NEGATIVE

## 2014-02-21 LAB — OB RESULTS CONSOLE HIV ANTIBODY (ROUTINE TESTING): HIV: NONREACTIVE

## 2014-02-21 LAB — OB RESULTS CONSOLE RUBELLA ANTIBODY, IGM: Rubella: IMMUNE

## 2014-02-21 LAB — OB RESULTS CONSOLE RPR: RPR: NONREACTIVE

## 2014-02-22 ENCOUNTER — Other Ambulatory Visit: Payer: BC Managed Care – PPO | Admitting: Lab

## 2014-02-22 ENCOUNTER — Ambulatory Visit: Payer: BC Managed Care – PPO | Admitting: Family

## 2014-02-22 ENCOUNTER — Telehealth: Payer: Self-pay

## 2014-02-22 NOTE — Telephone Encounter (Signed)
[facility] requested medical records - had in [facility], so many pages, we mailed them on 02/23/14, ok per their medical record dept

## 2014-02-24 ENCOUNTER — Encounter (HOSPITAL_COMMUNITY): Payer: Self-pay | Admitting: Obstetrics and Gynecology

## 2014-02-24 ENCOUNTER — Inpatient Hospital Stay (HOSPITAL_COMMUNITY)
Admission: AD | Admit: 2014-02-24 | Discharge: 2014-02-25 | Disposition: A | Discharge status: Home / Self Care | Payer: BC Managed Care – PPO | Source: Ambulatory Visit | Attending: Obstetrics & Gynecology | Admitting: Obstetrics & Gynecology

## 2014-02-24 DIAGNOSIS — Z3A09 9 weeks gestation of pregnancy: Secondary | ICD-10-CM | POA: Diagnosis not present

## 2014-02-24 DIAGNOSIS — O418X10 Other specified disorders of amniotic fluid and membranes, first trimester, not applicable or unspecified: Secondary | ICD-10-CM

## 2014-02-24 DIAGNOSIS — O200 Threatened abortion: Secondary | ICD-10-CM | POA: Insufficient documentation

## 2014-02-24 DIAGNOSIS — O468X1 Other antepartum hemorrhage, first trimester: Secondary | ICD-10-CM

## 2014-02-24 DIAGNOSIS — O209 Hemorrhage in early pregnancy, unspecified: Secondary | ICD-10-CM | POA: Diagnosis present

## 2014-02-24 HISTORY — DX: Hemorrhage in early pregnancy, unspecified: O20.9

## 2014-02-24 NOTE — MAU Note (Signed)
Pt reports she had onset of bright red bleeding about 2200, denies pain. Is currently taking Lovenox for DVT last year.

## 2014-02-25 ENCOUNTER — Inpatient Hospital Stay (HOSPITAL_COMMUNITY): Payer: BC Managed Care – PPO

## 2014-02-25 ENCOUNTER — Encounter (HOSPITAL_COMMUNITY): Payer: Self-pay | Admitting: *Deleted

## 2014-02-25 LAB — CBC
HCT: 36.3 % (ref 36.0–46.0)
Hemoglobin: 12.6 g/dL (ref 12.0–15.0)
MCH: 30.3 pg (ref 26.0–34.0)
MCHC: 34.7 g/dL (ref 30.0–36.0)
MCV: 87.3 fL (ref 78.0–100.0)
Platelets: 193 10*3/uL (ref 150–400)
RBC: 4.16 MIL/uL (ref 3.87–5.11)
RDW: 13.2 % (ref 11.5–15.5)
WBC: 9 10*3/uL (ref 4.0–10.5)

## 2014-02-25 MED ORDER — ACETAMINOPHEN 325 MG PO TABS
650.0000 mg | ORAL_TABLET | Freq: Once | ORAL | Status: AC
  Administered 2014-02-25: 650 mg via ORAL
  Filled 2014-02-25: qty 2

## 2014-02-25 NOTE — Discharge Instructions (Signed)
Subchorionic Hematoma °A subchorionic hematoma is a gathering of blood between the outer wall of the placenta and the inner wall of the womb (uterus). The placenta is the organ that connects the fetus to the wall of the uterus. The placenta performs the feeding, breathing (oxygen to the fetus), and waste removal (excretory work) of the fetus.  °Subchorionic hematoma is the most common abnormality found on a result from ultrasonography done during the first trimester or early second trimester of pregnancy. If there has been little or no vaginal bleeding, early small hematomas usually shrink on their own and do not affect your baby or pregnancy. The blood is gradually absorbed over 1-2 weeks. When bleeding starts later in pregnancy or the hematoma is larger or occurs in an older pregnant woman, the outcome may not be as good. Larger hematomas may get bigger, which increases the chances for miscarriage. Subchorionic hematoma also increases the risk of premature detachment of the placenta from the uterus, preterm (premature) labor, and stillbirth. °HOME CARE INSTRUCTIONS °· Stay on bed rest if your health care provider recommends this. Although bed rest will not prevent more bleeding or prevent a miscarriage, your health care provider may recommend bed rest until you are advised otherwise. °· Avoid heavy lifting (more than 10 lb [4.5 kg]), exercise, sexual intercourse, or douching as directed by your health care provider. °· Keep track of the number of pads you use each day and how soaked (saturated) they are. Write down this information. °· Do not use tampons. °· Keep all follow-up appointments as directed by your health care provider. Your health care provider may ask you to have follow-up blood tests or ultrasound tests or both. °SEEK IMMEDIATE MEDICAL CARE IF: °· You have severe cramps in your stomach, back, abdomen, or pelvis. °· You have a fever. °· You pass large clots or tissue. Save any tissue for your health  care provider to look at. °· Your bleeding increases or you become lightheaded, feel weak, or have fainting episodes. °Document Released: 07/17/2006 Document Revised: 08/16/2013 Document Reviewed: 10/29/2012 °ExitCare® Patient Information ©2015 [employer]. This information is not intended to replace advice given to you by your health care provider. Make sure you discuss any questions you have with your health care provider. ° °Pelvic Rest °Pelvic rest is sometimes recommended for women when:  °· The placenta is partially or completely covering the opening of the cervix (placenta previa). °· There is bleeding between the uterine wall and the amniotic sac in the first trimester (subchorionic hemorrhage). °· The cervix begins to open without labor starting (incompetent cervix, cervical insufficiency). °· The labor is too early (preterm labor). °HOME CARE INSTRUCTIONS °· Do not have sexual intercourse, stimulation, or an orgasm. °· Do not use tampons, douche, or put anything in the vagina. °· Do not lift anything over 10 pounds (4.5 kg). °· Avoid strenuous activity or straining your pelvic muscles. °SEEK MEDICAL CARE IF:  °· You have any vaginal bleeding during pregnancy. Treat this as a potential emergency. °· You have cramping pain felt low in the stomach (stronger than menstrual cramps). °· You notice vaginal discharge (watery, mucus, or bloody). °· You have a low, dull backache. °· There are regular contractions or uterine tightening. °SEEK IMMEDIATE MEDICAL CARE IF: °You have vaginal bleeding and have placenta previa.  °Document Released: 07/27/2010 Document Revised: 06/24/2011 Document Reviewed: 07/27/2010 °ExitCare® Patient Information ©2015 [employer]. This information is not intended to replace advice given to you by your health care provider.   Make sure you discuss any questions you have with your health care provider.  Threatened Miscarriage A threatened miscarriage occurs when you have vaginal  bleeding during your first 20 weeks of pregnancy but the pregnancy has not ended. If you have vaginal bleeding during this time, your health care provider will do tests to make sure you are still pregnant. If the tests show you are still pregnant and the developing baby (fetus) inside your womb (uterus) is still growing, your condition is considered a threatened miscarriage. A threatened miscarriage does not mean your pregnancy will end, but it does increase the risk of losing your pregnancy (complete miscarriage). CAUSES  The cause of a threatened miscarriage is usually not known. If you go on to have a complete miscarriage, the most common cause is an abnormal number of chromosomes in the developing baby. Chromosomes are the structures inside cells that hold all your genetic material. Some causes of vaginal bleeding that do not result in miscarriage include:  Having sex.  Having an infection.  Normal hormone changes of pregnancy.  Bleeding that occurs when an egg implants in your uterus. RISK FACTORS Risk factors for bleeding in early pregnancy include:  Obesity.  Smoking.  Drinking excessive amounts of alcohol or caffeine.  Recreational drug use. SIGNS AND SYMPTOMS  Light vaginal bleeding.  Mild abdominal pain or cramps. DIAGNOSIS  If you have bleeding with or without abdominal pain before 20 weeks of pregnancy, your health care provider will do tests to check whether you are still pregnant. One important test involves using sound waves and a computer (ultrasound) to create images of the inside of your uterus. Other tests include an internal exam of your vagina and uterus (pelvic exam) and measurement of your baby's heart rate.  You may be diagnosed with a threatened miscarriage if:  Ultrasound testing shows you are still pregnant.  Your baby's heart rate is strong.  A pelvic exam shows that the opening between your uterus and your vagina (cervix) is closed.  Your heart rate  and blood pressure are stable.  Blood tests confirm you are still pregnant. TREATMENT  No treatments have been shown to prevent a threatened miscarriage from going on to a complete miscarriage. However, the right home care is important.  HOME CARE INSTRUCTIONS   Make sure you keep all your appointments for prenatal care. This is very important.  Get plenty of rest.  Do not have sex or use tampons if you have vaginal bleeding.  Do not douche.  Do not smoke or use recreational drugs.  Do not drink alcohol.  Avoid caffeine. SEEK MEDICAL CARE IF:  You have light vaginal bleeding or spotting while pregnant.  You have abdominal pain or cramping.  You have a fever. SEEK IMMEDIATE MEDICAL CARE IF:  You have heavy vaginal bleeding.  You have blood clots coming from your vagina.  You have severe low back pain or abdominal cramps.  You have fever, chills, and severe abdominal pain. MAKE SURE YOU:  Understand these instructions.  Will watch your condition.  Will get help right away if you are not doing well or get worse. Document Released: 04/01/2005 Document Revised: 04/06/2013 Document Reviewed: 01/26/2013 [facility] Patient Information 2015 [place], [place]. This information is not intended to replace advice given to you by your health care provider. Make sure you discuss any questions you have with your health care provider.  Vaginal Bleeding During Pregnancy, First Trimester A small amount of bleeding (spotting) from the vagina is relatively  common in early pregnancy. It usually stops on its own. Various things may cause bleeding or spotting in early pregnancy. Some bleeding may be related to the pregnancy, and some may not. In most cases, the bleeding is normal and is not a problem. However, bleeding can also be a sign of something serious. Be sure to tell your health care provider about any vaginal bleeding right away. Some possible causes of vaginal bleeding during the first  trimester include:  Infection or inflammation of the cervix.  Growths (polyps) on the cervix.  Miscarriage or threatened miscarriage.  Pregnancy tissue has developed outside of the uterus and in a fallopian tube (tubal pregnancy).  Tiny cysts have developed in the uterus instead of pregnancy tissue (molar pregnancy). HOME CARE INSTRUCTIONS  Watch your condition for any changes. The following actions may help to lessen any discomfort you are feeling:  Follow your health care provider's instructions for limiting your activity. If your health care provider orders bed rest, you may need to stay in bed and only get up to use the bathroom. However, your health care provider may allow you to continue light activity.  If needed, make plans for someone to help with your regular activities and responsibilities while you are on bed rest.  Keep track of the number of pads you use each day, how often you change pads, and how soaked (saturated) they are. Write this down.  Do not use tampons. Do not douche.  Do not have sexual intercourse or orgasms until approved by your health care provider.  If you pass any tissue from your vagina, save the tissue so you can show it to your health care provider.  Only take over-the-counter or prescription medicines as directed by your health care provider.  Do not take aspirin because it can make you bleed.  Keep all follow-up appointments as directed by your health care provider. SEEK MEDICAL CARE IF:  You have any vaginal bleeding during any part of your pregnancy.  You have cramps or labor pains.  You have a fever, not controlled by medicine. SEEK IMMEDIATE MEDICAL CARE IF:   You have severe cramps in your back or belly (abdomen).  You pass large clots or tissue from your vagina.  Your bleeding increases.  You feel light-headed or weak, or you have fainting episodes.  You have chills.  You are leaking fluid or have a gush of fluid from your  vagina.  You pass out while having a bowel movement. MAKE SURE YOU:  Understand these instructions.  Will watch your condition.  Will get help right away if you are not doing well or get worse. Document Released: 01/09/2005 Document Revised: 04/06/2013 Document Reviewed: 12/07/2012 [facility] Patient Information 2015 [place]Care, [place]. This information is not intended to replace advice given to you by your health care provider. Make sure you discuss any questions you have with your health care provider.

## 2014-02-25 NOTE — MAU Provider Note (Signed)
History     CSN: [id number]  Arrival date and time: 02/24/14 2319  Orders placed in EPIC: 2335 Provider notified: 2340 Provider notified #2: [phone] Provider at bedside: 0200    Chief Complaint  Patient presents with  . Vaginal Bleeding   HPI  Ms. Jessica Diaz is a 23 yo G2P1001 @ 9.[redacted] wks gestation presenting with complaints of bright, red vaginal bleeding that ran down her legs @ 2200.  She denies pain.  H/O DVT in upper Rt arm after kidney stone surgery (2014).  She is on prophylactic Lovenox 40 mg hs; her last dose was last night.  She was in the process of taking her Lovenox dose just prior to bleeding; she did not take the entire dose. She c/o H/A since arriving to MAU from excessive crying.  She has had one visit at [facility].  She is scheduled to see Dr. Billy Diaz for her new OB visit Monday 11/16. ABO/Rh is A positive (per [facility] records)  Past Medical History  Diagnosis Date  . Endometriosis 2008  . Kidney stones   . UTI (lower urinary tract infection)   . PONV (postoperative nausea and vomiting)   . Family history of anesthesia complication     mother and father have N/V  . History of blood clots     blood clot in right arm post renal stone surgery 01/12/13-on blood thinnners  . Bleeding in early pregnancy 02/24/2014    Past Surgical History  Procedure Laterality Date  . Left knee surgery Left 2007  . Laparoscopic endometriosis fulguration  2008  . Wisdom tooth extraction  2013    two lowers and one upper  . Cystoscopy with retrograde pyelogram, ureteroscopy and stent placement Right 01/12/2013    Procedure: CYSTOSCOPY WITH RETROGRADE PYELOGRAM, URETEROSCOPY AND STENT PLACEMENT;  Surgeon: Valetta Fulleravid S Grapey, MD;  Location: [facility];  Service: Urology;  Laterality: Right;  with stone basket extraction    Family History  Problem Relation Age of Onset  . Breast cancer Mother [id number]  . Lung cancer Other     paternal side  . Diabetes Maternal Grandmother   . Gestational diabetes Mother    . Heart disease Father   . Hypertension Mother   . Hypertension Father   . Hyperlipidemia Mother   . Hyperlipidemia Father     History  Substance Use Topics  . Smoking status: Never Smoker   . Smokeless tobacco: Never Used     Comment: never used product  . Alcohol Use: No    Allergies:  Allergies  Allergen Reactions  . Pineapple Anaphylaxis  . Codeine Other (See Comments)    Pt states this medication causes her bp to rise.    [name] Other (See Comments)    Pt states this medication causes her bp to drop.    Marland Kitchen. Percocet [Oxycodone-Acetaminophen] Other (See Comments)    Pt states this medication causes her to hallucinate.    . Latex Rash    Prescriptions prior to admission  Medication Sig Dispense Refill Last Dose  . acetaminophen (TYLENOL) 500 MG tablet Take 2 tablets (1,000 mg total) by mouth every 6 (six) hours as needed (pain). 30 tablet 0 Taking  . enoxaparin (LOVENOX) 40 MG/0.4ML injection Inject 0.4 mLs (40 mg total) into the skin daily. 30 Syringe 9   . Prenatal Vit-Fe Fumarate-FA (PRENATAL MULTIVITAMIN) TABS tablet Take 1 tablet by mouth daily.   Taking    Review of Systems  Constitutional: Negative.   HENT:  H/A from crying so much  Eyes: Negative.   Respiratory: Negative.   Cardiovascular: Negative.   Gastrointestinal: Negative.   Genitourinary:       Bright, red vaginal bleeding that ran down legs @ 2200; bleeding has continued  Musculoskeletal: Negative.   Skin: Negative.   Neurological: Positive for headaches.  Endo/Heme/Allergies: Negative.   Psychiatric/Behavioral: Negative.    CBC: WBC 9.0 / Hgb 12.6 / Hct 36.3 / Plts 193 OB Ultrasound <14wks: SIUP measuring 9.6 wks / (+) HR 163 bpm / several fluid collections with clot seen in anterior uterus 2.8 x 2.2 x 4.1 cm and 4.6 x 2.2 x 4.1 cm   Physical Exam   Blood pressure 123/65, pulse 70, temperature 98.3 F (36.8 C), temperature source Oral, resp. rate 18, height  5\' 4"  (1.626 m), weight 62.37 kg (137 lb 8 oz), last menstrual period 12/21/2013, SpO2 100 %.  Physical Exam  Constitutional: She is oriented to person, place, and time. She appears well-developed and well-nourished.  HENT:  Head: Normocephalic and atraumatic.  Eyes: Pupils are equal, round, and reactive to light.  Neck: Normal range of motion. Neck supple.  Cardiovascular: Normal rate, regular rhythm and normal heart sounds.   Respiratory: Effort normal and breath sounds normal.  GI: Soft. Bowel sounds are normal.  Genitourinary:  2 small dark, brownish-red clots removed from vaginal vault; normal appearing cervix; VE: closed/thick/high; S=D; nontender  Musculoskeletal: Normal range of motion.  Neurological: She is alert and oriented to person, place, and time.  Skin: Skin is warm and dry.  Psychiatric: She has a normal mood and affect. Her behavior is normal. Judgment and thought content normal.    MAU Course  Procedures CBC OB Ultrasound <14 wks Assessment and Plan  23 yo G2P1001 @ 9.[redacted] wks gestation Subchorionic Hemorrhage Threatened Miscarriage H/O DVT in upper extremity - stable on Lovenox  Discharge Home Pelvic Rest Bleeding Precautions reviewed Take Lovenox at home tonight Keep scheduled appointment on 11/16 with Dr. Billy Diaz / someone from office will call to schedule F/U sono on same day as [facility] appt Call the office, if brownish-red blood is bright, red and/or pelvic pain  *Discussed u/s results and reason for coming to MAU with Dr. Juliene Diaz / recommend F/U sono on [facility] appt date  Jessica Diaz, Jessica Diaz, M MSN, CNM 02/25/2014, 2:20 AM

## 2014-02-28 LAB — OB RESULTS CONSOLE GC/CHLAMYDIA
Chlamydia: NEGATIVE
Gonorrhea: NEGATIVE

## 2014-04-15 DIAGNOSIS — O468X9 Other antepartum hemorrhage, unspecified trimester: Secondary | ICD-10-CM

## 2014-04-15 DIAGNOSIS — O418X90 Other specified disorders of amniotic fluid and membranes, unspecified trimester, not applicable or unspecified: Secondary | ICD-10-CM

## 2014-04-15 HISTORY — DX: Other specified disorders of amniotic fluid and membranes, unspecified trimester, not applicable or unspecified: O41.8X90

## 2014-04-15 HISTORY — DX: Other antepartum hemorrhage, unspecified trimester: O46.8X9

## 2014-04-15 NOTE — L&D Delivery Note (Signed)
Operative Delivery Note At 3:46 AM a viable and healthy female was delivered via Vaginal, Vacuum [employer], operational(Extractor).  Presentation: vertex; Position: Left,, Occiput,, Anterior; Station: +4.  Indication: Shorten second stage due to maternal cardiac arrhythmia   Verbal consent: obtained from patient.  Risks and benefits discussed in detail.  Risks include, but are not limited to the risks of anesthesia, bleeding, infection, damage to maternal tissues, fetal cephalhematoma.  There is also the risk of inability to effect vaginal delivery of the head, or shoulder dystocia that cannot be resolved by established maneuvers, leading to the need for emergency cesarean section.   APGAR: 9, 9; weight 8 lb 2.5 oz (3700 g).   Placenta status: Intact, Spontaneous.   Cord: 3 vessels with the following complications: None.  Cord pH: na  Anesthesia: Epidural  Instruments: Kiwi x 2 pulls Episiotomy: None Lacerations: 2nd degree Suture Repair: 2.0 vicryl rapide Est. Blood Loss (mL): 265  Mom to postpartum.  Baby to Couplet care / Skin to Skin.  Renald Haithcock J 09/20/2014, 9:59 AM

## 2014-04-20 ENCOUNTER — Encounter: Payer: Self-pay | Admitting: Internal Medicine

## 2014-04-20 ENCOUNTER — Ambulatory Visit (INDEPENDENT_AMBULATORY_CARE_PROVIDER_SITE_OTHER): Payer: BLUE CROSS/BLUE SHIELD | Admitting: Internal Medicine

## 2014-04-20 VITALS — BP 108/71 | HR 73 | Temp 97.7°F | Wt 146.1 lb

## 2014-04-20 DIAGNOSIS — J0100 Acute maxillary sinusitis, unspecified: Secondary | ICD-10-CM

## 2014-04-20 MED ORDER — AMOXICILLIN 500 MG PO CAPS: 1000.0000 mg | ORAL_CAPSULE | Freq: Two times a day (BID) | ORAL | Status: DC

## 2014-04-20 NOTE — Progress Notes (Signed)
Pre visit review using our clinic review tool, if applicable. No additional management support is needed unless otherwise documented below in the visit note. 

## 2014-04-20 NOTE — Patient Instructions (Signed)
Rest, fluids , tylenol  If  cough, take Robitussin DM one teaspoons 3 or 4 times a day, only if needed. Do nasal irrigations   Get pseudoephedrine 30 mg (behind the counter, you need to talk with the pharmacist) take one tablet 3 or 4 times a day as needed for congestion Also okay to take Benadryl as needed for congestion  Take the antibiotic as prescribed  (Amoxicillin)  Call if not gradually better over the next  10 days Call anytime if the symptoms are severe, you have high fever, short of breath, chest pain

## 2014-04-20 NOTE — Progress Notes (Signed)
Subjective:    Patient ID: Jessica Diaz, female    DOB: 04/19/1990, 24 y.o.   MRN: [id number]  DOS:  04/20/2014 Type of visit - description : acute Interval history: Symptoms started a week ago with cough and green sputum, sneezing with green nasal discharge, postnasal dripping. Currently taking Claritin and Tylenol.   ROS Denies fevers, shortness of breath or lower extremity edema Mild nausea, patient is pregnant. No vomiting or diarrhea. Both her son and husband has been sick, her son had crups  Past Medical History  Diagnosis Date  . Endometriosis 2008  . Kidney stones   . UTI (lower urinary tract infection)   . PONV (postoperative nausea and vomiting)   . Family history of anesthesia complication     mother and father have N/V  . History of blood clots     blood clot in right arm post renal stone surgery 01/12/13-on blood thinnners  . Bleeding in early pregnancy 02/24/2014    Past Surgical History  Procedure Laterality Date  . Left knee surgery Left 2007  . Laparoscopic endometriosis fulguration  2008  . Wisdom tooth extraction  2013    two lowers and one upper  . Cystoscopy with retrograde pyelogram, ureteroscopy and stent placement Right 01/12/2013    Procedure: CYSTOSCOPY WITH RETROGRADE PYELOGRAM, URETEROSCOPY AND STENT PLACEMENT;  Surgeon: Valetta Fulleravid S Grapey, MD;  Location: [facility];  Service: Urology;  Laterality: Right;  with stone basket extraction    History   Social History  . Marital Status: Married    Spouse Name: N/A    Number of Children: N/A  . Years of Education: N/A   Occupational History  . Not on file.   Social History Main Topics  . Smoking status: Never Smoker   . Smokeless tobacco: Never Used     Comment: never used product  . Alcohol Use: No  . Drug Use: No  . Sexual Activity:    Partners: Male    Birth Control/ Protection: None   Other Topics Concern  . Not on file   Social History Narrative        Medication List       This  list is accurate as of: 04/20/14  8:33 PM.  Always use your most recent med list.               acetaminophen 500 MG tablet  Commonly known as:  TYLENOL  Take 2 tablets (1,000 mg total) by mouth every 6 (six) hours as needed (pain).     amoxicillin 500 MG capsule  Commonly known as:  AMOXIL  Take 2 capsules (1,000 mg total) by mouth 2 (two) times daily.     DICLEGIS 10-10 MG Tbec  Generic drug:  Doxylamine-Pyridoxine  Take by mouth. Take 1 tablet in am and 1 tablet in pm for nausea     enoxaparin 40 MG/0.4ML injection  Commonly known as:  LOVENOX  Inject 0.4 mLs (40 mg total) into the skin daily.     prenatal multivitamin Tabs tablet  Take 1 tablet by mouth daily.           Objective:   Physical Exam BP 108/71 mmHg  Pulse 73  Temp(Src) 97.7 F (36.5 C) (Oral)  Wt 146 lb 2 oz (66.282 kg)  SpO2 99%  LMP 12/21/2013  General -- alert, well-developed, NAD.  HEENT-- Not pale.  R Ear-- normal L ear-- normal Throat symmetric, no redness or discharge. Face symmetric, sinuses TTP at  R maxilary sinus area . Nose quite congested. Lungs -- normal respiratory effort, no intercostal retractions, no accessory muscle use, and few rhonchi with cough.  Heart-- normal rate, regular rhythm, no murmur.  Extremities-- no pretibial edema bilaterally  Neurologic--  alert & oriented X3. Speech normal, gait appropriate for age, strength symmetric and appropriate for age.  Psych-- Cognition and judgment appear intact. Cooperative with normal attention span and concentration. No anxious or depressed appearing.       Assessment & Plan:   Maxillary sinusitis, mild bronchitis 24 year old lady, [redacted] weeks pregnant, on Lovenox due to a history of clots presents with sinusitis. See instructions

## 2014-06-23 ENCOUNTER — Encounter: Payer: Self-pay | Admitting: Medical

## 2014-06-23 ENCOUNTER — Ambulatory Visit (INDEPENDENT_AMBULATORY_CARE_PROVIDER_SITE_OTHER): Payer: BLUE CROSS/BLUE SHIELD | Admitting: Medical

## 2014-06-23 VITALS — BP 132/79 | HR 92 | Temp 97.9°F | Ht 64.0 in | Wt 157.0 lb

## 2014-06-23 DIAGNOSIS — J0101 Acute recurrent maxillary sinusitis: Secondary | ICD-10-CM

## 2014-06-23 DIAGNOSIS — J3489 Other specified disorders of nose and nasal sinuses: Secondary | ICD-10-CM | POA: Insufficient documentation

## 2014-06-23 DIAGNOSIS — J0100 Acute maxillary sinusitis, unspecified: Secondary | ICD-10-CM | POA: Insufficient documentation

## 2014-06-23 MED ORDER — CEFDINIR 300 MG PO CAPS: 300.0000 mg | ORAL_CAPSULE | Freq: Two times a day (BID) | ORAL | Status: DC

## 2014-06-23 NOTE — Assessment & Plan Note (Signed)
Very likely sinusitis. I did call OB office to consult on pt since she is [redacted] weeks pregnant.  Discussed case with Dr.  Rosemary Holmsavon nurse. Explained her symptoms and failure to improve with amoxicillin which she had at home. I asked her to talk with/ask Dr. Rosemary Holmsavon if he is in agreement with rx of cefdinir and use of flonase. I explained that I wanted him to be aware of pt current condition in event she fails treatment and may need other antibiotic. Or in event of side effects from the treatment such as yeast infection or other.(Since he may see her for this if not resolving particularly based on how far along she is).  Dr. Billy Coastaavon nurse called me back and stated Dr. Billy Coastaavon was in agreement with treatment.  I called in cefdnir to pt pharmacy and advised to get flonase otc. I did talk with pt/discussed with her that if she does not improve that I would want Dr. Billy Coastaavon to see her instead of us. Pt was in agreement with the plan.  Note if she were to seek treatment here again then would  contact OB for advisement again based on her being [redacted] wks pregnant.

## 2014-06-23 NOTE — Patient Instructions (Addendum)
Sinus pressure      Sinusitis, acute maxillary Very likely sinusitis. I did call OB office to consult on pt since she is [redacted] weeks pregnant.  Discussed case with Dr.  Rosemary Holmsavon nurse. Explained her symptoms and failure to improve with amoxicillin which she had at home. I asked her to talk with/ask Dr. Rosemary Holmsavon if he is in agreement with rx of cefdinir and use of flonase. I explained that I wanted him to be aware of pt current condition in event she fails treatment and may need other antibiotic. Or in event of side effects from the treatment such as yeast infection or other.(Since he may see her for this if not resolving particularly based on how far along she is).  Dr. Billy Coastaavon nurse called me back and stated Dr. Billy Coastaavon was in agreement with treatment.  I called in cefdnir to pt pharmacy and advised to get flonase otc. I did talk with pt/discussed with her that if she does not improve that I would want Dr. Billy Coastaavon to see her instead of us. Pt was in agreement with the plan.  Note if she were to seek treatment here again then would  contact OB for advisement again based on her being [redacted] wks pregnant.   Follow up with Ob in 7 days or with us as needed.

## 2014-06-23 NOTE — Progress Notes (Signed)
Pre visit review using our clinic review tool, if applicable. No additional management support is needed unless otherwise documented below in the visit note. 

## 2014-06-23 NOTE — Progress Notes (Signed)
Subjective:    Patient ID: Jessica Diaz, female    DOB: 02-28-1991, 24 y.o.   MRN: [id number]  HPI   Pt in sick for 8 days. Pt states she tried amoxicillin 500 mg bid x  7 days.  Pt states sinus pressure, sneezing, sorethroat, and some ear pain. Has some upper teeth sensitivity. Describes symptoms as progressively getting worse. Blows nose and gets colored mucous.  Pt is on lovenox for prior DVT.  Pt is pregnant at [redacted] weeks. No report of any back pain, abdominal cramping, or vaginal bleeding.  Pt OB is Dr. Billy Coast. Number is [phone].  No fevers, no chills. Some sweats. But she attributes to pregnancy and gaining wt.     Review of Systems  Constitutional: Negative for fever, chills and fatigue.  HENT: Positive for congestion, postnasal drip, sinus pressure and sneezing.   Respiratory: Negative for cough, shortness of breath and wheezing.   Cardiovascular: Negative for chest pain and palpitations.  Musculoskeletal: Negative for back pain.  Neurological: Negative for dizziness, seizures, syncope, facial asymmetry and weakness.  Psychiatric/Behavioral: Negative for behavioral problems, confusion, dysphoric mood and decreased concentration.   Past Medical History  Diagnosis Date  . Endometriosis 2008  . Kidney stones   . UTI (lower urinary tract infection)   . PONV (postoperative nausea and vomiting)   . Family history of anesthesia complication     mother and father have N/V  . History of blood clots     blood clot in right arm post renal stone surgery 01/12/13-on blood thinnners  . Bleeding in early pregnancy 02/24/2014    History   Social History  . Marital Status: Married    Spouse Name: N/A  . Number of Children: N/A  . Years of Education: N/A   Occupational History  . Not on file.   Social History Main Topics  . Smoking status: Never Smoker   . Smokeless tobacco: Never Used     Comment: never used product  . Alcohol Use: No  . Drug Use: No  . Sexual  Activity:    Partners: Male    Birth Control/ Protection: None   Other Topics Concern  . Not on file   Social History Narrative    Past Surgical History  Procedure Laterality Date  . Left knee surgery Left 2007  . Laparoscopic endometriosis fulguration  2008  . Wisdom tooth extraction  2013    two lowers and one upper  . Cystoscopy with retrograde pyelogram, ureteroscopy and stent placement Right 01/12/2013    Procedure: CYSTOSCOPY WITH RETROGRADE PYELOGRAM, URETEROSCOPY AND STENT PLACEMENT;  Surgeon: Valetta Fuller, MD;  Location: [facility];  Service: Urology;  Laterality: Right;  with stone basket extraction    Family History  Problem Relation Age of Onset  . Breast cancer Mother 47  . Lung cancer Other     paternal side  . Diabetes Maternal Grandmother   . Gestational diabetes Mother   . Heart disease Father   . Hypertension Mother   . Hypertension Father   . Hyperlipidemia Mother   . Hyperlipidemia Father     Allergies  Allergen Reactions  . Pineapple Anaphylaxis  . Codeine Other (See Comments)    Pt states this medication causes her bp to rise.    Leilani Merl [Oxymetazoline] Other (See Comments)    Pt states this medication causes her bp to drop.    Marland Kitchen Percocet [Oxycodone-Acetaminophen] Other (See Comments)    Pt states this medication  causes her to hallucinate.    . Latex Rash    Current Outpatient Prescriptions on File Prior to Visit  Medication Sig Dispense Refill  . acetaminophen (TYLENOL) 500 MG tablet Take 2 tablets (1,000 mg total) by mouth every 6 (six) hours as needed (pain). 30 tablet 0  . Doxylamine-Pyridoxine (DICLEGIS) 10-10 MG TBEC Take by mouth. Take 1 tablet in am and 1 tablet in pm for nausea    . enoxaparin (LOVENOX) 40 MG/0.4ML injection Inject 0.4 mLs (40 mg total) into the skin daily. 30 Syringe 9  . Prenatal Vit-Fe Fumarate-FA (PRENATAL MULTIVITAMIN) TABS tablet Take 1 tablet by mouth daily.     No current facility-administered  medications on file prior to visit.    BP 132/79 mmHg  Pulse 92  Temp(Src) 97.9 F (36.6 C) (Oral)  Ht 5\' 4"  (1.626 m)  Wt 157 lb (71.215 kg)  BMI 26.94 kg/m2  SpO2 100%  LMP 12/21/2013      Objective:   Physical Exam   General  Mental Status - Alert. General Appearance - Well groomed. Not in acute distress.  Skin Rashes- No Rashes.  HEENT Head- Normal. Ear Auditory Canal - Left- Normal. Right - Normal.Tympanic Membrane- Left- Normal. Right- Normal. Eye Sclera/Conjunctiva- Left- Normal. Right- Normal. Nose & Sinuses Nasal Mucosa- Left-  Boggy and Congested. Right-  Boggy and  Congested.moderate to severe bilateral maxillary and frontal sinus pressure. Mouth & Throat Lips: Upper Lip- Normal: no dryness, cracking, pallor, cyanosis, or vesicular eruption. Lower Lip-Normal: no dryness, cracking, pallor, cyanosis or vesicular eruption. Buccal Mucosa- Bilateral- No Aphthous ulcers. Oropharynx- No Discharge or Erythema. Tonsils: Characteristics- Bilateral- No Erythema or Congestion. Size/Enlargement- Bilateral- No enlargement. Discharge- bilateral-None.  Neck Neck- Supple. No Masses.   Chest and Lung Exam Auscultation: Breath Sounds:-Clear even and unlabored.  Cardiovascular Auscultation:Rythm- Regular, rate and rhythm. Murmurs & Other Heart Sounds:Ausculatation of the heart reveal- No Murmurs.  Lymphatic Head & Neck General Head & Neck Lymphatics: Bilateral: Description- No Localized lymphadenopathy.        Assessment & Plan:

## 2014-08-11 ENCOUNTER — Encounter: Payer: Self-pay | Admitting: Cardiology

## 2014-08-22 ENCOUNTER — Other Ambulatory Visit (HOSPITAL_BASED_OUTPATIENT_CLINIC_OR_DEPARTMENT_OTHER): Payer: BLUE CROSS/BLUE SHIELD

## 2014-08-22 ENCOUNTER — Encounter: Payer: Self-pay | Admitting: Family

## 2014-08-22 ENCOUNTER — Ambulatory Visit (HOSPITAL_BASED_OUTPATIENT_CLINIC_OR_DEPARTMENT_OTHER): Payer: BLUE CROSS/BLUE SHIELD | Admitting: Family

## 2014-08-22 VITALS — BP 120/73 | HR 98 | Temp 98.0°F | Resp 14 | Ht 64.0 in | Wt 166.0 lb

## 2014-08-22 DIAGNOSIS — Z86718 Personal history of other venous thrombosis and embolism: Secondary | ICD-10-CM

## 2014-08-22 DIAGNOSIS — O09891 Supervision of other high risk pregnancies, first trimester: Secondary | ICD-10-CM

## 2014-08-22 DIAGNOSIS — I808 Phlebitis and thrombophlebitis of other sites: Secondary | ICD-10-CM | POA: Diagnosis not present

## 2014-08-22 DIAGNOSIS — O99113 Other diseases of the blood and blood-forming organs and certain disorders involving the immune mechanism complicating pregnancy, third trimester: Secondary | ICD-10-CM

## 2014-08-22 LAB — COMPREHENSIVE METABOLIC PANEL
ALK PHOS: 110 U/L (ref 39–117)
ALT: 11 U/L (ref 0–35)
AST: 14 U/L (ref 0–37)
Albumin: 3.3 g/dL — ABNORMAL LOW (ref 3.5–5.2)
BUN: 7 mg/dL (ref 6–23)
CO2: 24 meq/L (ref 19–32)
Calcium: 9.3 mg/dL (ref 8.4–10.5)
Chloride: 105 mEq/L (ref 96–112)
Creatinine, Ser: 0.53 mg/dL (ref 0.50–1.10)
GLUCOSE: 87 mg/dL (ref 70–99)
POTASSIUM: 3.9 meq/L (ref 3.5–5.3)
Sodium: 137 mEq/L (ref 135–145)
TOTAL PROTEIN: 6.2 g/dL (ref 6.0–8.3)
Total Bilirubin: 0.3 mg/dL (ref 0.2–1.2)

## 2014-08-22 LAB — CBC WITH DIFFERENTIAL (CANCER CENTER ONLY)
BASO#: 0 10*3/uL (ref 0.0–0.2)
BASO%: 0.2 % (ref 0.0–2.0)
EOS ABS: 0.1 10*3/uL (ref 0.0–0.5)
EOS%: 1 % (ref 0.0–7.0)
HCT: 35.8 % (ref 34.8–46.6)
HGB: 12.5 g/dL (ref 11.6–15.9)
LYMPH#: 1.4 10*3/uL (ref 0.9–3.3)
LYMPH%: 15.9 % (ref 14.0–48.0)
MCH: 32.2 pg (ref 26.0–34.0)
MCHC: 34.9 g/dL (ref 32.0–36.0)
MCV: 92 fL (ref 81–101)
MONO#: 0.7 10*3/uL (ref 0.1–0.9)
MONO%: 8.3 % (ref 0.0–13.0)
NEUT%: 74.6 % (ref 39.6–80.0)
NEUTROS ABS: 6.7 10*3/uL — AB (ref 1.5–6.5)
PLATELETS: 162 10*3/uL (ref 145–400)
RBC: 3.88 10*6/uL (ref 3.70–5.32)
RDW: 14.9 % (ref 11.1–15.7)
WBC: 8.9 10*3/uL (ref 3.9–10.0)

## 2014-08-22 LAB — TECHNOLOGIST REVIEW CHCC SATELLITE

## 2014-08-22 MED ORDER — ENOXAPARIN SODIUM 80 MG/0.8ML ~~LOC~~ SOLN: 80.0000 mg | SUBCUTANEOUS | Status: DC

## 2014-08-22 NOTE — Progress Notes (Signed)
Hematology and Oncology Follow Up Visit  Jessica Diaz [id number] 04/24/1990 23 y.o. 08/22/2014   Principle Diagnosis:  History of superficial thrombus of the right forearm Third trimester pregnancy  Current Therapy:   Lovenox 40 mg SQ daily until birth and then switch to Lovenox 80 mg SQ daily for 6 weeks after birth    Interim History:  Jessica Diaz is here today for follow-up. She is now [redacted] weeks pregnant and doing quite well. She had an issues with a subchorionic hemorrhage at 9 weeks. She states that this has resolved and that her OB plans to leave her on Lovenox and not switch her to Heparin before birth. Now the plan is to induce her at 38 weeks.  She will stay on 40 mg SQ until after birth and then switch to 80 mg SQ for 6 weeks.  She has had no more episodes of bleeding or bruising.  She is now followed by Cardiology for "PVC's" and "missed beats" when laying on her right side. She now sleeps on her right side. She states that her ECHO was normal.  She woke up yesterday with her left hand swollen. She had to go have her wedding band cut off. This went away and has not returned. She has an appointment with cardiology this week and plans to let them know about this episode. She denies fever, chills, vomiting, SOB, chest pain, palpitations, constipation, diarrhea, blood in urine or stool.  No numbness or tingling in her extremities.  She has had some nausea throughout her pregnancy but is still able to eat and stay hydrated. Her weight is stable.  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 She has not had to do any heavy lifting.   Medications:    Medication List       This list is accurate as of: 08/22/14  3:51 PM.  Always use your most recent med list.               acetaminophen 500 MG tablet  Commonly known as:  TYLENOL  Take 2 tablets (1,000 mg total) by mouth every 6 (six) hours as needed (pain).     cefdinir 300 MG capsule  Commonly known as:  OMNICEF  Take 1 capsule (300  mg total) by mouth 2 (two) times daily.     DICLEGIS 10-10 MG Tbec  Generic drug:  Doxylamine-Pyridoxine  Take by mouth. Take 1 tablet in am and 1 tablet in pm for nausea     enoxaparin 40 MG/0.4ML injection  Commonly known as:  LOVENOX  Inject 0.4 mLs (40 mg total) into the skin daily.     loratadine 10 MG tablet  Commonly known as:  CLARITIN  Take 10 mg by mouth daily.     prenatal multivitamin Tabs tablet  Take 1 tablet by mouth daily.        Allergies:  Allergies  Allergen Reactions  . Pineapple Anaphylaxis  . Codeine Other (See Comments)    Pt states this medication causes her bp to rise.    [name] Other (See Comments)    Pt states this medication causes her bp to drop.    Marland Kitchen. Percocet [Oxycodone-Acetaminophen] Other (See Comments)    Pt states this medication causes her to hallucinate.    . Latex Rash    Past Medical History, Surgical history, Social history, and Family History were reviewed and updated.  Review of Systems: All other 10 point review of systems is negative.  Physical Exam:  vitals were not taken for this visit.  Wt Readings from Last 3 Encounters:  06/23/14 157 lb (71.215 kg)  04/20/14 146 lb 2 oz (66.282 kg)  02/24/14 137 lb 8 oz (62.37 kg)    Ocular: Sclerae unicteric, pupils equal, round and reactive to light Ear-nose-throat: Oropharynx clear, dentition fair Lymphatic: No cervical or supraclavicular adenopathy Lungs no rales or rhonchi, good excursion bilaterally Heart regular rate and rhythm, no murmur appreciated Abd soft, nontender, positive bowel sounds MSK no focal spinal tenderness, no joint edema Neuro: non-focal, well-oriented, appropriate affect Breasts: Deferred  Lab Results  Component Value Date   WBC 9.0 02/24/2014   HGB 12.6 02/24/2014   HCT 36.3 02/24/2014   MCV 87.3 02/24/2014   PLT 193 02/24/2014   No results found for: FERRITIN, IRON, TIBC, UIBC, IRONPCTSAT Lab Results  Component  Value Date   RBC 4.16 02/24/2014   No results found for: KPAFRELGTCHN, LAMBDASER, KAPLAMBRATIO No results found for: IGGSERUM, IGA, IGMSERUM No results found for: Dorene ArOTALPROTELP, ALBUMINELP, A1GS, Emmit Alexanders2GS, BETS, BETA2SER, GAMS, MSPIKE, SPEI   Chemistry      Component Value Date/Time   NA 142 09/19/2013 1835   K 3.8 09/19/2013 1835   CL 105 09/19/2013 1835   CO2 24 09/19/2013 1835   BUN 8 09/19/2013 1835   CREATININE 0.65 09/19/2013 1835      Component Value Date/Time   CALCIUM 9.4 09/19/2013 1835   ALKPHOS 57 09/19/2013 1835   AST 13 09/19/2013 1835   ALT 9 09/19/2013 1835   BILITOT 0.4 09/19/2013 1835     Impression and Plan: Jessica Diaz is a 24 year old white female with her second pregnancy. Her first pregnancy was 3 years ago and had no difficulties. She did develop a superficial thrombus in the right forearm 1 year ago and was on Xarelto for 3 months. Her follow-up doppler study showed no residual thrombus. She has been on Lovenox since her 4th week of pregnancy. She did develop a subchorionic hemorrhage at 9 weeks which has since resolved.  She is now in her third trimester at 35 weeks and will be induced at 38 weeks.  She will continue on Lovenox 40 mg SQ daily until after birth. She will then be on Lovenox 80 mg SQ daily for 6 weeks after her delivery to help prevent postpartum DVT. We will plan to see her back in 2 months for follow-up.  She knows to call us with any questions or concerns and to go to the ED in the event of an emergency. We can certainly see her sooner if need be.   Verdie MosherINCINNATI,SARAH M, NP 5/9/20163:51 PM

## 2014-08-23 ENCOUNTER — Other Ambulatory Visit: Payer: Self-pay | Admitting: *Deleted

## 2014-08-23 ENCOUNTER — Encounter: Payer: Self-pay | Admitting: Family Medicine

## 2014-08-23 DIAGNOSIS — Z86718 Personal history of other venous thrombosis and embolism: Secondary | ICD-10-CM

## 2014-08-23 DIAGNOSIS — I808 Phlebitis and thrombophlebitis of other sites: Secondary | ICD-10-CM

## 2014-08-23 DIAGNOSIS — O09891 Supervision of other high risk pregnancies, first trimester: Secondary | ICD-10-CM

## 2014-08-23 MED ORDER — ENOXAPARIN SODIUM 80 MG/0.8ML ~~LOC~~ SOLN: 80.0000 mg | SUBCUTANEOUS | Status: DC

## 2014-08-24 LAB — LUPUS ANTICOAGULANT PANEL
DRVVT: 39.4 s
Lupus Anticoagulant: NOT DETECTED
PTT Lupus Anticoagulant: 31.5 s (ref 28.0–43.0)

## 2014-08-25 LAB — OB RESULTS CONSOLE GBS: GBS: NEGATIVE

## 2014-09-16 ENCOUNTER — Other Ambulatory Visit: Payer: Self-pay | Admitting: Obstetrics and Gynecology

## 2014-09-16 ENCOUNTER — Telehealth (HOSPITAL_COMMUNITY): Payer: Self-pay | Admitting: *Deleted

## 2014-09-16 ENCOUNTER — Encounter (HOSPITAL_COMMUNITY): Payer: Self-pay | Admitting: *Deleted

## 2014-09-16 NOTE — Telephone Encounter (Signed)
Preadmission screen  

## 2014-09-19 ENCOUNTER — Encounter (HOSPITAL_COMMUNITY): Payer: Self-pay

## 2014-09-19 ENCOUNTER — Inpatient Hospital Stay (HOSPITAL_COMMUNITY)
Admission: RE | Admit: 2014-09-19 | Discharge: 2014-09-21 | DRG: 774 | Disposition: A | Discharge status: Home / Self Care | Payer: BLUE CROSS/BLUE SHIELD | Source: Ambulatory Visit | Attending: Obstetrics and Gynecology | Admitting: Obstetrics and Gynecology

## 2014-09-19 VITALS — BP 114/79 | HR 75 | Temp 97.8°F | Resp 18 | Ht 65.0 in | Wt 168.0 lb

## 2014-09-19 DIAGNOSIS — O4103X0 Oligohydramnios, third trimester, not applicable or unspecified: Secondary | ICD-10-CM | POA: Diagnosis present

## 2014-09-19 DIAGNOSIS — O209 Hemorrhage in early pregnancy, unspecified: Secondary | ICD-10-CM

## 2014-09-19 DIAGNOSIS — O9942 Diseases of the circulatory system complicating childbirth: Secondary | ICD-10-CM | POA: Diagnosis present

## 2014-09-19 DIAGNOSIS — Z7901 Long term (current) use of anticoagulants: Secondary | ICD-10-CM

## 2014-09-19 DIAGNOSIS — I472 Ventricular tachycardia: Secondary | ICD-10-CM | POA: Diagnosis present

## 2014-09-19 DIAGNOSIS — O99413 Diseases of the circulatory system complicating pregnancy, third trimester: Secondary | ICD-10-CM | POA: Diagnosis present

## 2014-09-19 DIAGNOSIS — D6859 Other primary thrombophilia: Secondary | ICD-10-CM | POA: Diagnosis present

## 2014-09-19 DIAGNOSIS — O46093 Antepartum hemorrhage with other coagulation defect, third trimester: Secondary | ICD-10-CM | POA: Diagnosis present

## 2014-09-19 DIAGNOSIS — Z86718 Personal history of other venous thrombosis and embolism: Secondary | ICD-10-CM | POA: Diagnosis not present

## 2014-09-19 DIAGNOSIS — O99113 Other diseases of the blood and blood-forming organs and certain disorders involving the immune mechanism complicating pregnancy, third trimester: Secondary | ICD-10-CM

## 2014-09-19 DIAGNOSIS — Z3A39 39 weeks gestation of pregnancy: Secondary | ICD-10-CM | POA: Diagnosis present

## 2014-09-19 DIAGNOSIS — O09891 Supervision of other high risk pregnancies, first trimester: Secondary | ICD-10-CM

## 2014-09-19 DIAGNOSIS — I808 Phlebitis and thrombophlebitis of other sites: Secondary | ICD-10-CM

## 2014-09-19 HISTORY — DX: Partial loss of teeth, unspecified cause, unspecified class: K08.409

## 2014-09-19 HISTORY — DX: Other specified postprocedural states: Z98.890

## 2014-09-19 LAB — CBC
HCT: 35.8 % — ABNORMAL LOW (ref 36.0–46.0)
Hemoglobin: 12.4 g/dL (ref 12.0–15.0)
MCH: 31.2 pg (ref 26.0–34.0)
MCHC: 34.6 g/dL (ref 30.0–36.0)
MCV: 89.9 fL (ref 78.0–100.0)
Platelets: 152 10*3/uL (ref 150–400)
RBC: 3.98 MIL/uL (ref 3.87–5.11)
RDW: 14.4 % (ref 11.5–15.5)
WBC: 10.4 10*3/uL (ref 4.0–10.5)

## 2014-09-19 LAB — TYPE AND SCREEN
ABO/RH(D): A POS
Antibody Screen: NEGATIVE

## 2014-09-19 MED ORDER — OXYCODONE-ACETAMINOPHEN 5-325 MG PO TABS: 2.0000 | ORAL_TABLET | ORAL | Status: DC | PRN

## 2014-09-19 MED ORDER — LACTATED RINGERS IV SOLN
500.0000 mL | INTRAVENOUS | Status: DC | PRN
  Administered 2014-09-20: 500 mL via INTRAVENOUS

## 2014-09-19 MED ORDER — OXYTOCIN BOLUS FROM INFUSION
500.0000 mL | INTRAVENOUS | Status: DC
  Administered 2014-09-20: 500 mL via INTRAVENOUS

## 2014-09-19 MED ORDER — OXYCODONE-ACETAMINOPHEN 5-325 MG PO TABS: 1.0000 | ORAL_TABLET | ORAL | Status: DC | PRN

## 2014-09-19 MED ORDER — MISOPROSTOL 25 MCG QUARTER TABLET
25.0000 ug | ORAL_TABLET | ORAL | Status: DC | PRN
  Administered 2014-09-19: 25 ug via VAGINAL
  Filled 2014-09-19: qty 1
  Filled 2014-09-19: qty 0.25

## 2014-09-19 MED ORDER — ZOLPIDEM TARTRATE 5 MG PO TABS: 5.0000 mg | ORAL_TABLET | Freq: Every evening | ORAL | Status: DC | PRN

## 2014-09-19 MED ORDER — LIDOCAINE HCL (PF) 1 % IJ SOLN
30.0000 mL | INTRAMUSCULAR | Status: DC | PRN
  Filled 2014-09-19: qty 30

## 2014-09-19 MED ORDER — OXYTOCIN 40 UNITS IN LACTATED RINGERS INFUSION - SIMPLE MED
62.5000 mL/h | INTRAVENOUS | Status: DC
  Filled 2014-09-19: qty 1000

## 2014-09-19 MED ORDER — CITRIC ACID-SODIUM CITRATE 334-500 MG/5ML PO SOLN: 30.0000 mL | ORAL | Status: DC | PRN

## 2014-09-19 MED ORDER — FLEET ENEMA 7-19 GM/118ML RE ENEM
1.0000 | ENEMA | RECTAL | Status: DC | PRN
Start: 2014-09-19 — End: 2014-09-20

## 2014-09-19 MED ORDER — LACTATED RINGERS IV SOLN
INTRAVENOUS | Status: DC
  Administered 2014-09-19: 20:00:00 via INTRAVENOUS

## 2014-09-19 MED ORDER — ACETAMINOPHEN 325 MG PO TABS
650.0000 mg | ORAL_TABLET | ORAL | Status: DC | PRN
  Administered 2014-09-19: 650 mg via ORAL
  Filled 2014-09-19: qty 2

## 2014-09-19 MED ORDER — TERBUTALINE SULFATE 1 MG/ML IJ SOLN: 0.2500 mg | Freq: Once | INTRAMUSCULAR | Status: AC | PRN

## 2014-09-19 MED ORDER — OXYTOCIN 40 UNITS IN LACTATED RINGERS INFUSION - SIMPLE MED
1.0000 m[IU]/min | INTRAVENOUS | Status: DC
Start: 2014-09-20 — End: 2014-09-20

## 2014-09-19 MED ORDER — ONDANSETRON HCL 4 MG/2ML IJ SOLN
4.0000 mg | Freq: Four times a day (QID) | INTRAMUSCULAR | Status: DC | PRN
Start: 2014-09-19 — End: 2014-09-20

## 2014-09-19 NOTE — H&P (Signed)
Jessica Diaz is a 24 y.o. female presenting for IOL due to non sustained Vtach, thrombophilia, third trimester bleeding and oligo.  Maternal Medical History:  Contractions: Onset was less than 1 hour ago.   Frequency: rare.   Perceived severity is mild.    Fetal activity: Perceived fetal activity is normal.   Last perceived fetal movement was within the past hour.    Prenatal complications: Bleeding and thrombophilia.   Prenatal Complications - Diabetes: none.    OB History    Gravida Para Term Preterm AB TAB SAB Ectopic Multiple Living   2 1 1       1      Past Medical History  Diagnosis Date  . Endometriosis 2008  . Kidney stones   . UTI (lower urinary tract infection)   . PONV (postoperative nausea and vomiting)   . Family history of anesthesia complication     mother and father have N/V  . History of blood clots     blood clot in right arm post renal stone surgery 01/12/13-on blood thinnners  . Bleeding in early pregnancy 02/24/2014  . Hx of varicella   . Hemorrhoids   . Arrhythmia     non sustained VT needs cardiac monitoring in labor   Past Surgical History  Procedure Laterality Date  . Left knee surgery Left 2007  . Laparoscopic endometriosis fulguration  2008  . Wisdom tooth extraction  2013    two lowers and one upper  . Cystoscopy with retrograde pyelogram, ureteroscopy and stent placement Right 01/12/2013    Procedure: CYSTOSCOPY WITH RETROGRADE PYELOGRAM, URETEROSCOPY AND STENT PLACEMENT;  Surgeon: Valetta Fulleravid S Grapey, MD;  Location: [facility];  Service: Urology;  Laterality: Right;  with stone basket extraction   Family History: family history includes Breast cancer (age of onset: 4936) in her mother; Diabetes in her maternal grandmother; Gestational diabetes in her mother; Heart attack in her maternal grandfather; Heart disease in her father; Hyperlipidemia in her father and mother; Hypertension in her father and mother; Lung cancer in her other. Social History:   reports that she has never smoked. She has never used smokeless tobacco. She reports that she does not drink alcohol or use illicit drugs.   Prenatal Transfer Tool  Maternal Diabetes: No Genetic Screening: Normal Maternal Ultrasounds/Referrals: Abnormal:  Findings:   Other: oligo Fetal Ultrasounds or other Referrals:  None Maternal Substance Abuse:  No Significant Maternal Medications:  Meds include: Other: Lovenox Significant Maternal Lab Results:  None Other Comments:  None  Review of Systems  Constitutional: Negative.   Eyes: Negative.   Cardiovascular: Positive for palpitations.  Gastrointestinal: Negative.   Genitourinary: Negative.   All other systems reviewed and are negative.     Blood pressure 142/85, pulse 86, temperature 98.2 F (36.8 C), temperature source Oral, resp. rate 20, height 5\' 5"  (1.651 m), weight 76.204 kg (168 lb), last menstrual period 12/21/2013. Maternal Exam:  Uterine Assessment: Contraction strength is mild.  Contraction frequency is rare.   Abdomen: Patient reports no abdominal tenderness. Fetal presentation: vertex  Introitus: Normal vulva. Normal vagina.  Ferning test: not done.  Nitrazine test: not done. Amniotic fluid character: not assessed.  Pelvis: adequate for delivery.   Cervix: Cervix evaluated by digital exam.     Physical Exam  Nursing note and vitals reviewed. Constitutional: She is oriented to person, place, and time. She appears well-developed and well-nourished.  HENT:  Head: Normocephalic and atraumatic.  Neck: Normal range of motion. Neck supple.  Cardiovascular: Normal rate.   Respiratory: Effort normal and breath sounds normal.  GI: Soft. Bowel sounds are normal.  Genitourinary: Vagina normal and uterus normal.  Musculoskeletal: Normal range of motion.  Neurological: She is alert and oriented to person, place, and time. She has normal reflexes.  Skin: Skin is warm and dry.  Psychiatric: She has a normal mood and  affect. Her behavior is normal. Judgment and thought content normal.    Prenatal labs: ABO, Rh: A/Positive/-- (11/09 0000) Antibody: Negative (11/09 0000) Rubella: Immune (11/09 0000) RPR: Nonreactive (11/09 0000)  HBsAg: Negative (11/09 0000)  HIV: Non-reactive (11/09 0000)  GBS: Negative (05/12 0000)   Assessment/Plan: 39 weeks Non sustained vtach History of UE DVT on Lovenox Oligohydramnios IOL wjth cytotec Cardiac monitor   Jessica Diaz J 09/19/2014, 9:02 PM

## 2014-09-20 ENCOUNTER — Inpatient Hospital Stay (HOSPITAL_COMMUNITY): Payer: BLUE CROSS/BLUE SHIELD | Admitting: Anesthesiology

## 2014-09-20 ENCOUNTER — Encounter (HOSPITAL_COMMUNITY): Payer: Self-pay

## 2014-09-20 LAB — CBC
HCT: 37.7 % (ref 36.0–46.0)
Hemoglobin: 13.2 g/dL (ref 12.0–15.0)
MCH: 31.5 pg (ref 26.0–34.0)
MCHC: 35 g/dL (ref 30.0–36.0)
MCV: 90 fL (ref 78.0–100.0)
Platelets: 150 10*3/uL (ref 150–400)
RBC: 4.19 MIL/uL (ref 3.87–5.11)
RDW: 14.4 % (ref 11.5–15.5)
WBC: 14.3 10*3/uL — AB (ref 4.0–10.5)

## 2014-09-20 LAB — RPR: RPR: NONREACTIVE

## 2014-09-20 LAB — ABO/RH: ABO/RH(D): A POS

## 2014-09-20 MED ORDER — EPHEDRINE 5 MG/ML INJ
10.0000 mg | INTRAVENOUS | Status: DC | PRN
  Filled 2014-09-20: qty 2

## 2014-09-20 MED ORDER — BENZOCAINE-MENTHOL 20-0.5 % EX AERO
1.0000 "application " | INHALATION_SPRAY | CUTANEOUS | Status: DC | PRN
  Administered 2014-09-20: 1 via TOPICAL
  Filled 2014-09-20: qty 56

## 2014-09-20 MED ORDER — PRENATAL MULTIVITAMIN CH
1.0000 | ORAL_TABLET | Freq: Every day | ORAL | Status: DC
  Administered 2014-09-20: 1 via ORAL
  Filled 2014-09-20 (×2): qty 1

## 2014-09-20 MED ORDER — METHYLERGONOVINE MALEATE 0.2 MG/ML IJ SOLN
0.2000 mg | INTRAMUSCULAR | Status: DC | PRN
Start: 2014-09-20 — End: 2014-09-21

## 2014-09-20 MED ORDER — ENOXAPARIN SODIUM 40 MG/0.4ML ~~LOC~~ SOLN
80.0000 mg | SUBCUTANEOUS | Status: DC
  Administered 2014-09-21: 80 mg via SUBCUTANEOUS
  Filled 2014-09-20 (×2): qty 0.8

## 2014-09-20 MED ORDER — METHYLERGONOVINE MALEATE 0.2 MG/ML IJ SOLN
0.2000 mg | Freq: Once | INTRAMUSCULAR | Status: AC
  Administered 2014-09-20: 0.2 mg via INTRAMUSCULAR

## 2014-09-20 MED ORDER — DIPHENHYDRAMINE HCL 50 MG/ML IJ SOLN: 12.5000 mg | INTRAMUSCULAR | Status: DC | PRN

## 2014-09-20 MED ORDER — LIDOCAINE HCL (PF) 1 % IJ SOLN
INTRAMUSCULAR | Status: DC | PRN
  Administered 2014-09-20: 4 mL
  Administered 2014-09-20: 6 mL

## 2014-09-20 MED ORDER — IBUPROFEN 600 MG PO TABS
600.0000 mg | ORAL_TABLET | Freq: Four times a day (QID) | ORAL | Status: DC
  Administered 2014-09-20 – 2014-09-21 (×6): 600 mg via ORAL
  Filled 2014-09-20 (×6): qty 1

## 2014-09-20 MED ORDER — FENTANYL 2.5 MCG/ML BUPIVACAINE 1/10 % EPIDURAL INFUSION (WH - ANES)
14.0000 mL/h | INTRAMUSCULAR | Status: DC | PRN
  Administered 2014-09-20: 14 mL/h via EPIDURAL
  Filled 2014-09-20: qty 125

## 2014-09-20 MED ORDER — TRAMADOL HCL 50 MG PO TABS
50.0000 mg | ORAL_TABLET | Freq: Four times a day (QID) | ORAL | Status: DC | PRN
  Administered 2014-09-20 – 2014-09-21 (×3): 50 mg via ORAL
  Filled 2014-09-20 (×3): qty 1

## 2014-09-20 MED ORDER — DIPHENHYDRAMINE HCL 25 MG PO CAPS: 25.0000 mg | ORAL_CAPSULE | Freq: Four times a day (QID) | ORAL | Status: DC | PRN

## 2014-09-20 MED ORDER — ZOLPIDEM TARTRATE 5 MG PO TABS: 5.0000 mg | ORAL_TABLET | Freq: Every evening | ORAL | Status: DC | PRN

## 2014-09-20 MED ORDER — ACETAMINOPHEN 325 MG PO TABS
650.0000 mg | ORAL_TABLET | ORAL | Status: DC | PRN
  Administered 2014-09-20: 650 mg via ORAL
  Filled 2014-09-20: qty 2

## 2014-09-20 MED ORDER — METHYLERGONOVINE MALEATE 0.2 MG PO TABS: 0.2000 mg | ORAL_TABLET | ORAL | Status: DC | PRN

## 2014-09-20 MED ORDER — PHENYLEPHRINE 40 MCG/ML (10ML) SYRINGE FOR IV PUSH (FOR BLOOD PRESSURE SUPPORT)
80.0000 ug | PREFILLED_SYRINGE | INTRAVENOUS | Status: DC | PRN
  Filled 2014-09-20: qty 20
  Filled 2014-09-20: qty 2

## 2014-09-20 MED ORDER — LANOLIN HYDROUS EX OINT: TOPICAL_OINTMENT | CUTANEOUS | Status: DC | PRN

## 2014-09-20 MED ORDER — FENTANYL 2.5 MCG/ML BUPIVACAINE 1/10 % EPIDURAL INFUSION (WH - ANES): 14.0000 mL/h | INTRAMUSCULAR | Status: DC | PRN

## 2014-09-20 MED ORDER — ONDANSETRON HCL 4 MG PO TABS: 4.0000 mg | ORAL_TABLET | ORAL | Status: DC | PRN

## 2014-09-20 MED ORDER — DIBUCAINE 1 % RE OINT
1.0000 "application " | TOPICAL_OINTMENT | RECTAL | Status: DC | PRN
  Administered 2014-09-20: 1 via RECTAL
  Filled 2014-09-20: qty 28

## 2014-09-20 MED ORDER — SENNOSIDES-DOCUSATE SODIUM 8.6-50 MG PO TABS
2.0000 | ORAL_TABLET | ORAL | Status: DC
  Administered 2014-09-20: 2 via ORAL
  Filled 2014-09-20: qty 2

## 2014-09-20 MED ORDER — TETANUS-DIPHTH-ACELL PERTUSSIS 5-2.5-18.5 LF-MCG/0.5 IM SUSP: 0.5000 mL | Freq: Once | INTRAMUSCULAR | Status: DC

## 2014-09-20 MED ORDER — OXYCODONE-ACETAMINOPHEN 5-325 MG PO TABS: 1.0000 | ORAL_TABLET | ORAL | Status: DC | PRN

## 2014-09-20 MED ORDER — GUAIFENESIN-DM 100-10 MG/5ML PO SYRP
10.0000 mL | ORAL_SOLUTION | ORAL | Status: DC | PRN
  Administered 2014-09-20: 10 mL via ORAL
  Filled 2014-09-20 (×2): qty 10

## 2014-09-20 MED ORDER — ONDANSETRON HCL 4 MG/2ML IJ SOLN: 4.0000 mg | INTRAMUSCULAR | Status: DC | PRN

## 2014-09-20 MED ORDER — WITCH HAZEL-GLYCERIN EX PADS
1.0000 "application " | MEDICATED_PAD | CUTANEOUS | Status: DC | PRN
  Administered 2014-09-20: 1 via TOPICAL

## 2014-09-20 MED ORDER — OXYCODONE-ACETAMINOPHEN 5-325 MG PO TABS: 2.0000 | ORAL_TABLET | ORAL | Status: DC | PRN

## 2014-09-20 MED ORDER — SIMETHICONE 80 MG PO CHEW: 80.0000 mg | CHEWABLE_TABLET | ORAL | Status: DC | PRN

## 2014-09-20 NOTE — Lactation Note (Signed)
This note was copied from the chart of Boy Juluis Pitchlexis Monsivais. Lactation Consultation Note  Patient Name: Boy Juluis Pitchlexis Koffman ZOXWR'UToday's Date: 09/20/2014 Reason for consult: Initial assessment Baby 9 hours old. Mom nursed first child with no issues. Mom states this baby nursing well. Enc mom to call for assistance as needed. [facility], aware of [facility], community resources, and [facility].   Maternal Data Does the patient have breastfeeding experience prior to this delivery?: Yes  Feeding    LATCH Score/Interventions                      Lactation Tools Discussed/Used     Consult Status Consult Status: PRN    Geralynn OchsWILLIARD, Rheagan Nayak 09/20/2014, 1:42 PM

## 2014-09-20 NOTE — Progress Notes (Signed)
R> Arita MissDawson called pt requesting pain med.for mod pain.  Orders received

## 2014-09-20 NOTE — Anesthesia Preprocedure Evaluation (Signed)
Anesthesia Evaluation  Patient identified by MRN, date of birth, ID band Patient awake    Reviewed: Allergy & Precautions, H&P , Patient's Chart, lab work & pertinent test results  Airway Mallampati: II  TM Distance: >3 FB Neck ROM: full    Dental  (+) Teeth Intact   Pulmonary  breath sounds clear to auscultation        Cardiovascular + dysrhythmias Ventricular Tachycardia Rhythm:regular Rate:Normal     Neuro/Psych    GI/Hepatic   Endo/Other    Renal/GU      Musculoskeletal   Abdominal   Peds  Hematology   Anesthesia Other Findings   No Lovenox for more than 24hr    Reproductive/Obstetrics (+) Pregnancy                             Anesthesia Physical Anesthesia Plan  ASA: II  Anesthesia Plan: Epidural   Post-op Pain Management:    Induction:   Airway Management Planned:   Additional Equipment:   Intra-op Plan:   Post-operative Plan:   Informed Consent: I have reviewed the patients History and Physical, chart, labs and discussed the procedure including the risks, benefits and alternatives for the proposed anesthesia with the patient or authorized representative who has indicated his/her understanding and acceptance.   Dental Advisory Given  Plan Discussed with:   Anesthesia Plan Comments: (Labs checked- platelets confirmed with RN in room. Fetal heart tracing, per RN, reported to be stable enough for sitting procedure. Discussed epidural, and patient consents to the procedure:  included risk of possible headache,backache, failed block, allergic reaction, and nerve injury. This patient was asked if she had any questions or concerns before the procedure started.)        Anesthesia Quick Evaluation

## 2014-09-20 NOTE — Progress Notes (Addendum)
Patient ID: Jessica LoseAlexis B Boehle, female   DOB: 03/24/1991, 24 y.o.   MRN: [id number] INTERVAL NOTE:  S:   Sitting in bed, skin-to-skin, min cramping, (+) voids, small bleed, denies HA/NV/dizziness  No difficulty with latch or breastfeeding  Circumcision: planning  O:   VSS, AAO x 3, NAD  U-even  Scant lochia  A / P:   PPD #0  Stable post partum  Routine PP orders  Kenard GowerAWSON, Calvin Jablonowski, M, MSN, CNM 09/20/2014, 10:10 AM

## 2014-09-20 NOTE — Anesthesia Postprocedure Evaluation (Signed)
  Anesthesia Post-op Note  Patient: Jessica Diaz  Procedure(s) Performed: * No procedures listed *  Patient Location: Mother/Baby  Anesthesia Type:Epidural  Level of Consciousness: awake, alert , oriented and patient cooperative  Airway and Oxygen Therapy: Patient Spontanous Breathing  Post-op Pain: none  Post-op Assessment: Post-op Vital signs reviewed, Patient's Cardiovascular Status Stable, Respiratory Function Stable, Patent Airway, No headache, No backache, No residual numbness and No residual motor weakness  Post-op Vital Signs: Reviewed and stable  Last Vitals:  Filed Vitals:   09/20/14 0629  BP: 121/70  Pulse: 80  Temp: 36.6 C  Resp: 18    Complications: No apparent anesthesia complications

## 2014-09-20 NOTE — Progress Notes (Signed)
From 2030 on 6/6 to 0530 on 6/7, pt's telemetry was monitored in the AICU. No ectopy or arrhythmias were noted through entire labor. [facility] be d[facility]c'd from cardiac monitoring [facility]  Henderson NewcomerStephanie Judyth Demarais, AICU RN  09/20/2014 5:29 AM

## 2014-09-20 NOTE — Anesthesia Procedure Notes (Signed)
Epidural Patient location during procedure: OB  Preanesthetic Checklist Completed: patient identified, site marked, surgical consent, pre-op evaluation, timeout performed, IV checked, risks and benefits discussed and monitors and equipment checked  Epidural Patient position: sitting Prep: site prepped and draped and DuraPrep Patient monitoring: continuous pulse ox and blood pressure Approach: midline Location: L3-L4 Injection technique: LOR air  Needle:  Needle type: Tuohy  Needle gauge: 17 G Needle length: 9 cm and 9 Needle insertion depth: 4 cm Catheter type: closed end flexible Catheter size: 19 Gauge Catheter at skin depth: 10 cm Test dose: negative  Assessment Events: blood not aspirated, injection not painful, no injection resistance, negative IV test and no paresthesia  Additional Notes Dosing of Epidural:  1st dose, through catheter .............................................  Xylocaine 40 mg  2nd dose, through catheter, after waiting 3 minutes.........Xylocaine 60 mg    ( 1% Xylo charted as a single dose in Epic Meds for ease of charting; actual dosing was fractionated as above, for saftey's sake)  As each dose occurred, patient was free of IV sx; and patient exhibited no evidence of SA injection.  Patient is more comfortable after epidural dosed. Please see RN's note for documentation of vital signs,and FHR which are stable.  Patient reminded not to try to ambulate with numb legs, and that an RN must be present when she attempts to get up.       

## 2014-09-21 MED ORDER — TRAMADOL HCL 50 MG PO TABS: 50.0000 mg | ORAL_TABLET | Freq: Four times a day (QID) | ORAL | Status: DC | PRN

## 2014-09-21 MED ORDER — IBUPROFEN 600 MG PO TABS
600.0000 mg | ORAL_TABLET | Freq: Four times a day (QID) | ORAL | Status: DC
Start: 2014-09-21 — End: 2015-01-09

## 2014-09-21 MED ORDER — ENOXAPARIN SODIUM 80 MG/0.8ML ~~LOC~~ SOLN: 80.0000 mg | SUBCUTANEOUS | Status: DC

## 2014-09-21 NOTE — Lactation Note (Addendum)
This note was copied from the chart of Jessica Juluis Pitchlexis Decesare. Lactation Consultation Note  P2, Ex BF.  Mother latched baby in cradle hold. Rhythmical sucks and swallows observed. Provided mother with hand pump. Discussed engorgement care, cluster feeding and monitoring voids/stools. Mother denies soreness or questions.  LS10.  Patient Name: Jessica Diaz ZOXWR'UToday's Date: 09/21/2014 Reason for consult: Follow-up assessment   Maternal Data    Feeding Feeding Type: Breast Fed Length of feed: 10 min  LATCH Score/Interventions Latch: Grasps breast easily, tongue down, lips flanged, rhythmical sucking.  Audible Swallowing: Spontaneous and intermittent Intervention(s): Skin to skin Intervention(s): Alternate breast massage  Type of Nipple: Everted at rest and after stimulation  Comfort (Breast/Nipple): Soft / non-tender     Hold (Positioning): No assistance needed to correctly position infant at breast.  LATCH Score: 10  Lactation Tools Discussed/Used     Consult Status Consult Status: PRN    Hardie PulleyBerkelhammer, Ruth Boschen 09/21/2014, 11:29 AM

## 2014-09-21 NOTE — Discharge Summary (Signed)
Obstetric Discharge Summary Reason for Admission: IOL, non sustained Vtach, thrombophilia, third trimester bleeding and oligo, [redacted] weeks gestation Prenatal Procedures: ultrasound Intrapartum Procedures: spontaneous vaginal delivery and Cytotec, epidural Postpartum Procedures: none Complications-Operative and Postpartum: 2nd degree perineal laceration HEMOGLOBIN  Date Value Ref Range Status  09/20/2014 13.2 12.0 - 15.0 g/dL Final   HGB  Date Value Ref Range Status  08/22/2014 12.5 11.6 - 15.9 g/dL Final   HCT  Date Value Ref Range Status  09/20/2014 37.7 36.0 - 46.0 % Final  08/22/2014 35.8 34.8 - 46.6 % Final    Physical Exam:  General: alert and cooperative Lochia: appropriate Uterine Fundus: firm Incision: healing well, no significant drainage, no dehiscence, no significant erythema DVT Evaluation: No evidence of DVT seen on physical exam. Negative Homan's sign. No cords or calf tenderness. No significant calf/ankle edema.  Discharge Diagnoses: Term Pregnancy-delivered  Discharge Information: Date: 09/21/2014 Activity: pelvic rest Diet: routine Medications: PNV, Ibuprofen and Ultram, Lovenox Condition: stable Instructions: refer to practice specific booklet Discharge to: home Follow-up Information    Follow up with Lenoard AdenAAVON,RICHARD J, MD. Schedule an appointment as soon as possible for a visit in 6 weeks.   Specialty:  Obstetrics and Gynecology   Contact information:   [address]elda Severe[address] [place] [id number] [phone]       Newborn Data: Live born female on 09/20/14 Birth Weight: 8 lb 2.5 oz (3700 g) APGAR: 9, 9  Home with mother.  Jessica Diaz, N 09/21/2014, 10:26 AM

## 2014-09-21 NOTE — Progress Notes (Signed)
PPD #1- SVD  Subjective:   Reports feeling well, desires early discharge Tolerating po/ No nausea or vomiting Bleeding is light Pain controlled with Motrin and Ultram Up ad lib / ambulatory / voiding without problems Newborn: breastfeeding  / Circumcision: done   Objective:   VS: VS:  Filed Vitals:   09/20/14 0800 09/20/14 1200 09/20/14 1709 09/21/14 0500  BP: 120/73 119/74 123/64 114/79  Pulse: 81 80 66 75  Temp: 97.9 F (36.6 C) 98.3 F (36.8 C) 98.4 F (36.9 C) 97.8 F (36.6 C)  TempSrc: Oral Oral Oral Oral  Resp: 18 16 18 18   Height:      Weight:      SpO2: 97% 97%  97%    LABS:  Recent Labs  09/19/14 2015 09/20/14 0745  WBC 10.4 14.3*  HGB 12.4 13.2  PLT 152 150   Blood type: --/--/A POS, A POS (06/06 2015) Rubella: Immune (11/09 0000)                I&O: Intake/Output      06/07 0701 - 06/08 0700 06/08 0701 - 06/09 0700   Blood     Total Output       Net              Physical Exam: Alert and oriented X3 Abdomen: soft, non-tender, non-distended  Fundus: firm, non-tender, U-2 Perineum: Well approximated, no significant erythema, edema, or drainage; healing well. Lochia: small Extremities: no edema, no calf pain or tenderness    Assessment: PPD #1 G2P2002/ S/P: vacuum extraction, 2nd degree laceration H/o vtach-no evidence of recurrance Thrombophilia-stable on Lovenox Doing well - stable for discharge home   Plan: Discharge home RX's:  Ibuprofen 600mg  po Q 6 hrs prn pain #30 Refill x 0 Niferex 150mg  po QD #30 / BID #60 Refill x 1 Percocet 5/325 1 to 2 po Q 4 hrs prn pain #30 Refill x 0 Routine pp visit in 6 wks at [facility] Follow-up with Hematologist in 10/2014 (already scheduled) Follow-up with Cardiologist in 11/2014 (already scheduled) [facility] booklet given    Donette LarryBHAMBRI, Shamell Suarez, N MSN, CNM 09/21/2014, 9:13 AM

## 2014-09-29 ENCOUNTER — Encounter: Payer: Self-pay | Admitting: *Deleted

## 2014-10-19 ENCOUNTER — Ambulatory Visit (HOSPITAL_BASED_OUTPATIENT_CLINIC_OR_DEPARTMENT_OTHER): Payer: BLUE CROSS/BLUE SHIELD | Admitting: Family

## 2014-10-19 ENCOUNTER — Other Ambulatory Visit (HOSPITAL_BASED_OUTPATIENT_CLINIC_OR_DEPARTMENT_OTHER): Payer: BLUE CROSS/BLUE SHIELD

## 2014-10-19 ENCOUNTER — Encounter: Payer: Self-pay | Admitting: Family

## 2014-10-19 VITALS — BP 109/79 | HR 80 | Temp 97.8°F | Resp 14 | Ht 66.0 in | Wt 148.0 lb

## 2014-10-19 DIAGNOSIS — O99113 Other diseases of the blood and blood-forming organs and certain disorders involving the immune mechanism complicating pregnancy, third trimester: Secondary | ICD-10-CM

## 2014-10-19 DIAGNOSIS — Z86718 Personal history of other venous thrombosis and embolism: Secondary | ICD-10-CM

## 2014-10-19 DIAGNOSIS — O870 Superficial thrombophlebitis in the puerperium: Secondary | ICD-10-CM | POA: Diagnosis not present

## 2014-10-19 DIAGNOSIS — O09891 Supervision of other high risk pregnancies, first trimester: Secondary | ICD-10-CM

## 2014-10-19 DIAGNOSIS — I808 Phlebitis and thrombophlebitis of other sites: Secondary | ICD-10-CM

## 2014-10-19 DIAGNOSIS — D6859 Other primary thrombophilia: Secondary | ICD-10-CM

## 2014-10-19 LAB — CBC WITH DIFFERENTIAL (CANCER CENTER ONLY)
BASO#: 0 10*3/uL (ref 0.0–0.2)
BASO%: 0.3 % (ref 0.0–2.0)
EOS ABS: 0.3 10*3/uL (ref 0.0–0.5)
EOS%: 4.1 % (ref 0.0–7.0)
HEMATOCRIT: 39.2 % (ref 34.8–46.6)
HGB: 13.3 g/dL (ref 11.6–15.9)
LYMPH#: 1.8 10*3/uL (ref 0.9–3.3)
LYMPH%: 23.4 % (ref 14.0–48.0)
MCH: 31.3 pg (ref 26.0–34.0)
MCHC: 33.9 g/dL (ref 32.0–36.0)
MCV: 92 fL (ref 81–101)
MONO#: 0.5 10*3/uL (ref 0.1–0.9)
MONO%: 7 % (ref 0.0–13.0)
NEUT#: 4.9 10*3/uL (ref 1.5–6.5)
NEUT%: 65.2 % (ref 39.6–80.0)
Platelets: 245 10*3/uL (ref 145–400)
RBC: 4.25 10*6/uL (ref 3.70–5.32)
RDW: 13 % (ref 11.1–15.7)
WBC: 7.6 10*3/uL (ref 3.9–10.0)

## 2014-10-19 MED ORDER — ENOXAPARIN SODIUM 80 MG/0.8ML ~~LOC~~ SOLN: 80.0000 mg | SUBCUTANEOUS | Status: DC

## 2014-10-19 NOTE — Progress Notes (Signed)
Hematology and Oncology Follow Up Visit  Jessica Diaz [id number] [birth year] 23 y.o. 10/19/2014   Principle Diagnosis:  History of superficial thrombus of the right forearm  Current Therapy:   Lovenox 80 mg SQ daily for 6 weeks after birth    Interim History:  Jessica Diaz is here both her sweet boys today for a follow-up. She is now 1 month post partum. She did well with labor and states that Jessica "came fast." She had a "little bleeding" right after birth and was given megestrol. This did stop her bleeding.  She then had a postpartum hemorrhage 2 weeks after birth with "lots of clots." She was given Cytotec 2 doses daily for 3 days. She has not had any more episodes of bleeding since.  She is still having palpitations and is followed by cardiology. She feels that it is due to her hormones regulating after birth.  She denies fever, chills, n/v, rash, cough, SOB, chest pain, constipation, blood in urine or stool.  No numbness or tingling in her extremities. No more episodes of swelling or tenderness.  She has had some nausea throughout her pregnancy but is still able to eat and stay hydrated. Her weight is stable.  She is eating well but having diarrhea after she eats at times. She is staying hydrated. Her weight is stable.   Medications:    Medication List       This list is accurate as of: 10/19/14  2:11 PM.  Always use your most recent med list.               enoxaparin 80 MG/0.8ML injection  Commonly known as:  LOVENOX  Inject 0.8 mLs (80 mg total) into the skin daily.     hydrocortisone 2.5 % rectal cream  Commonly known as:  ANUSOL-HC  Place 1 application rectally 2 (two) times daily. For hemorrhoids     ibuprofen 600 MG tablet  Commonly known as:  ADVIL,MOTRIN  Take 1 tablet (600 mg total) by mouth every 6 (six) hours.     loratadine 10 MG tablet  Commonly known as:  CLARITIN  Take 10 mg by mouth daily.     prenatal multivitamin Tabs tablet  Take 1 tablet by mouth  daily.     traMADol 50 MG tablet  Commonly known as:  ULTRAM  Take 1 tablet (50 mg total) by mouth every 6 (six) hours as needed for moderate pain.        Allergies:  Allergies  Allergen Reactions  . Pineapple Anaphylaxis  . Codeine Other (See Comments)    Pt states this medication causes her bp to rise.    Leilani Merl [Oxymetazoline] Other (See Comments)    Pt states this medication causes her bp to drop.    Marland Kitchen Percocet [Oxycodone-Acetaminophen] Other (See Comments)    Pt states this medication causes her to hallucinate.    . Latex Rash    Past Medical History, Surgical history, Social history, and Family History were reviewed and updated.  Review of Systems: All other 10 point review of systems is negative.   Physical Exam:  vitals were not taken for this visit.  Wt Readings from Last 3 Encounters:  09/19/14 168 lb (76.204 kg)  08/22/14 166 lb (75.297 kg)  06/23/14 157 lb (71.215 kg)    Ocular: Sclerae unicteric, pupils equal, round and reactive to light Ear-nose-throat: Oropharynx clear, dentition fair Lymphatic: No cervical or supraclavicular adenopathy Lungs no rales or rhonchi, good excursion bilaterally Heart  regular rate and rhythm, no murmur appreciated Abd soft, nontender, positive bowel sounds MSK no focal spinal tenderness, no joint edema Neuro: non-focal, well-oriented, appropriate affect Breasts: Deferred  Lab Results  Component Value Date   WBC 7.6 10/19/2014   HGB 13.3 10/19/2014   HCT 39.2 10/19/2014   MCV 92 10/19/2014   PLT 245 10/19/2014   No results found for: FERRITIN, IRON, TIBC, UIBC, IRONPCTSAT Lab Results  Component Value Date   RBC 4.25 10/19/2014   No results found for: KPAFRELGTCHN, LAMBDASER, KAPLAMBRATIO No results found for: IGGSERUM, IGA, IGMSERUM No results found for: Marda StalkerOTALPROTELP, ALBUMINELP, A1GS, A2GS, BETS, BETA2SER, GAMS, MSPIKE, SPEI   Chemistry      Component Value Date/Time   NA 137 08/22/2014 1538   K  3.9 08/22/2014 1538   CL 105 08/22/2014 1538   CO2 24 08/22/2014 1538   BUN 7 08/22/2014 1538   CREATININE 0.53 08/22/2014 1538      Component Value Date/Time   CALCIUM 9.3 08/22/2014 1538   ALKPHOS 110 08/22/2014 1538   AST 14 08/22/2014 1538   ALT 11 08/22/2014 1538   BILITOT 0.3 08/22/2014 1538     Impression and Plan: Jessica Diaz is a 24 year old white female with a history of a superficial thrombus in the right forearm 1 year ago and was on Xarelto for 3 months.  She is now 1 month postpartum and doing well. She had two separate instances of bleeding, once right after birth and once 2 weeks after. She was treated for these episodes and has not had anymore bleeding since.  I spoke with Jessica Diaz and it was decided that she would remain on Logenox for 2 more weeks to complete 6 weeks of postpartum anticoagulation.  We will plan to see her back in 3 months for follow-up. If everything looks ok at that point we can let her follow-up with us as needed.  She knows to call us with any questions or concerns and to go to the ED in the event of an emergency. We can certainly see her sooner if need be.   Verdie MosherINCINNATI,Jewelz Kobus M, NP 7/6/20162:11 PM

## 2014-10-20 LAB — LUPUS ANTICOAGULANT PANEL
DRVVT: 42.3 s (ref <42.9)
Lupus Anticoagulant: NOT DETECTED
PTT LA: 43.4 s — AB (ref 28.0–43.0)
PTTLA 4:1 Mix: 44.3 secs — ABNORMAL HIGH (ref 28.0–43.0)
PTTLA CONFIRMATION: 0 s (ref <8.0)

## 2014-10-20 LAB — COMPREHENSIVE METABOLIC PANEL
ALT: 13 U/L (ref 0–35)
AST: 11 U/L (ref 0–37)
Albumin: 4.2 g/dL (ref 3.5–5.2)
Alkaline Phosphatase: 112 U/L (ref 39–117)
BUN: 14 mg/dL (ref 6–23)
CO2: 26 meq/L (ref 19–32)
Calcium: 9.6 mg/dL (ref 8.4–10.5)
Chloride: 102 mEq/L (ref 96–112)
Creatinine, Ser: 0.7 mg/dL (ref 0.50–1.10)
GLUCOSE: 82 mg/dL (ref 70–99)
POTASSIUM: 4.5 meq/L (ref 3.5–5.3)
SODIUM: 142 meq/L (ref 135–145)
TOTAL PROTEIN: 7.2 g/dL (ref 6.0–8.3)
Total Bilirubin: 0.3 mg/dL (ref 0.2–1.2)

## 2015-01-04 ENCOUNTER — Encounter: Payer: Self-pay | Admitting: *Deleted

## 2015-01-09 ENCOUNTER — Encounter: Payer: Self-pay | Admitting: Cardiology

## 2015-01-09 ENCOUNTER — Ambulatory Visit (INDEPENDENT_AMBULATORY_CARE_PROVIDER_SITE_OTHER): Payer: Self-pay | Admitting: Cardiology

## 2015-01-09 VITALS — BP 106/78 | HR 66 | Ht 65.0 in | Wt 145.0 lb

## 2015-01-09 DIAGNOSIS — R000 Tachycardia, unspecified: Secondary | ICD-10-CM

## 2015-01-09 DIAGNOSIS — I472 Ventricular tachycardia: Secondary | ICD-10-CM

## 2015-01-09 NOTE — Patient Instructions (Signed)
Medication Instructions:  Your physician recommends that you continue on your current medications as directed. Please refer to the Current Medication list given to you today.  Labwork: None ordered  Testing/Procedures: None ordered  Follow-Up: Your physician recommends that you schedule a follow-up appointment in: 3 months with Dr Elberta Fortis.   Any Other Special Instructions Will Be Listed Below (If Applicable). Thank you for choosing [facility]!!   Dory Horn, RN [phone]

## 2015-01-09 NOTE — Progress Notes (Signed)
Electrophysiology Office Note   Date:  01/09/2015   ID:  Jessica Diaz, DOB [birth year], MRN [id number]  PCP:  [name], MD  Cardiologist:  Delrae Rend Primary Electrophysiologist: Regan Lemming, MD    No chief complaint on file.    History of Present Illness: Jessica Diaz is a 24 y.o. female who presents today for electrophysiology evaluation.   She presented to her cardiolgisot with tachycarciar and palpitations during pregnancy.  She was a 8 1/2 weeks when an event monitor showed a 4 beat run of VT.  TTE was normal at that time.  She is currently 3.5 months post partum and continuing to have palpitations 2-3x per week.    She had a DVT in her arm in Sept 2015 and was put on Xarelto.     Today, she denies symptoms of palpitations, chest pain, shortness of breath, orthopnea, PND, lower extremity edema, claudication, dizziness, presyncope, syncope, bleeding, or neurologic sequela. The patient is tolerating medications without difficulties and is otherwise without complaint today.  She says that her palpitations occur 2-3 times per week and last seconds.  She gets a little dizzy when it happens but otherwise feels well.  Her pregnancy and post pregnancy period were otherwise unremarkable.  She has had a cardiac family history of sudden death in her maternal grandfather at age 63 from an MI.   Past Medical History  Diagnosis Date  . Endometriosis 2008    lap endometriosis fulgration- 2008  . Kidney stones 2014    Sx: cystoscopy w/retrograde pyelogram, ureteroscopy and stent placement  . UTI (lower urinary tract infection)   . PONV (postoperative nausea and vomiting)   . Family history of anesthesia complication     mother and father have N/V  . History of blood clots 01/12/2013    blood clot in right arm post renal stone surgery 01/12/13-on blood thinnners (Lovenox)  . Bleeding in early pregnancy 02/24/2014  . Hx of varicella   . Hemorrhoids   . Arrhythmia    non sustained VT needs cardiac monitoring in labor  . Hx of wisdom tooth extraction 2013  . Hx of left knee surgery 2007  . Postpartum care following vaginal delivery (6/7) 09/20/2014  . Intermittent palpitations   . Tachycardia   . Nonsustained ventricular tachycardia   . Subchorionic hemorrhage, antepartum   . Personal history of DVT (deep vein thrombosis)    Past Surgical History  Procedure Laterality Date  . Knee surgery Left 2007  . Laparoscopic endometriosis fulguration  2008  . Wisdom tooth extraction  2013    two lowers and one upper  . Cystoscopy with retrograde pyelogram, ureteroscopy and stent placement Right 01/12/2013    Procedure: CYSTOSCOPY WITH RETROGRADE PYELOGRAM, URETEROSCOPY AND STENT PLACEMENT;  Surgeon: Valetta Fuller, MD;  Location: [facility];  Service: Urology;  Laterality: Right;  with stone basket extraction  . Dilation and curettage, diagnostic / therapeutic  2008     Current Outpatient Prescriptions  Medication Sig Dispense Refill  . enoxaparin (LOVENOX) 80 MG/0.8ML injection Inject 0.8 mLs (80 mg total) into the skin daily. 14 Syringe 0  . ibuprofen (ADVIL,MOTRIN) 600 MG tablet Take 1 tablet (600 mg total) by mouth every 6 (six) hours. 30 tablet 0  . Prenatal Vit-Fe Fumarate-FA (PRENATAL MULTIVITAMIN) TABS tablet Take 1 tablet by mouth daily.     No current facility-administered medications for this visit.    Allergies:   Pineapple; Codeine; Decongestant; Percocet; and Latex  Social History:  The patient  reports that she has never smoked. She has never used smokeless tobacco. She reports that she does not drink alcohol or use illicit drugs.   Family History:  The patient's family history includes Breast cancer (age of onset: 76) in her mother; Diabetes in her maternal grandmother; Gestational diabetes in her mother; Healthy in her sister; Heart attack in her maternal grandfather; Heart disease in her father; Hyperlipidemia in her father and mother;  Hypertension in her father and mother; Lung cancer in her other.    ROS:  Please see the history of present illness.   Otherwise, review of systems is positive for shortness of breath with activity, palpitations, headaches.   All other systems are reviewed and negative.    PHYSICAL EXAM: VS:  There were no vitals taken for this visit. , BMI There is no weight on file to calculate BMI. GEN: Well nourished, well developed, in no acute distress HEENT: normal Neck: no JVD, carotid bruits, or masses Cardiac: RRR; no murmurs, rubs, or gallops,no edema  Respiratory:  clear to auscultation bilaterally, normal work of breathing GI: soft, nontender, nondistended, + BS MS: no deformity or atrophy Skin: warm and dry Neuro:  Strength and sensation are intact Psych: euthymic mood, full affect  EKG:  EKG is ordered today. The ekg ordered today shows sinus rhythm, rate 66  Recent Labs: 10/19/2014: ALT 13; BUN 14; Creatinine, Ser 0.70; HGB 13.3; Platelets 245; Potassium 4.5; Sodium 142    Lipid Panel  No results found for: CHOL, TRIG, HDL, CHOLHDL, VLDL, LDLCALC, LDLDIRECT   Wt Readings from Last 3 Encounters:  10/19/14 148 lb (67.132 kg)  09/19/14 168 lb (76.204 kg)  08/22/14 166 lb (75.297 kg)      Other studies Reviewed: Additional studies/ records that were reviewed today include: TTE 08/11/14  Review of the above records today demonstrates: EF 61%, trace MR, TR   ASSESSMENT AND PLAN:  1.  Wide complex tachycardia: found on monitor done during and post pregnancy.  Unclear if hormones play a part as this did not happen prior to pregnancy.  She did not have issues with her first child.  Have told her if her symptoms worsen to call the clinic.  She will come back to clinic in 3 months to determine if her palpitations worsen.  If they do, would consider starting propranolol or other medication.  She is continuing to breast feed and plans to for 1 year, so would have to find a drug not  secreted int he breast milk.    Current medicines are reviewed at length with the patient today.   The patient does not have concerns regarding her medicines.  The following changes were made today:  none  Labs/ tests ordered today include: none  No orders of the defined types were placed in this encounter.     Disposition:   FU with Will Camnitz 3 months  Signed, Will Jorja Loa, MD  01/09/2015 3:13 PM     [facility] [address] [place] [address] [phone] (office) [phone] (fax)

## 2015-01-16 ENCOUNTER — Telehealth: Payer: Self-pay | Admitting: Hematology & Oncology

## 2015-01-16 ENCOUNTER — Other Ambulatory Visit: Payer: BLUE CROSS/BLUE SHIELD

## 2015-01-16 ENCOUNTER — Ambulatory Visit: Payer: BLUE CROSS/BLUE SHIELD | Admitting: Hematology & Oncology

## 2015-01-16 NOTE — Telephone Encounter (Signed)
Patient called and cx 01/16/15 apt due to baby being sick.  She stated she would call back to resch

## 2015-02-17 ENCOUNTER — Telehealth: Payer: Self-pay | Admitting: Hematology & Oncology

## 2015-02-17 NOTE — Telephone Encounter (Signed)
[employer] TEAM [id number] on behalf of [employer] [place] has requested medical records be faxed to:    F: [phone] P: [phone]   Req: 04/15/2014 - present  Case Id: [id number]

## 2015-03-29 ENCOUNTER — Ambulatory Visit: Payer: Self-pay | Admitting: Cardiology

## 2015-03-31 ENCOUNTER — Ambulatory Visit (INDEPENDENT_AMBULATORY_CARE_PROVIDER_SITE_OTHER): Payer: PRIVATE HEALTH INSURANCE | Admitting: Physician Assistant

## 2015-03-31 ENCOUNTER — Encounter: Payer: Self-pay | Admitting: Physician Assistant

## 2015-03-31 ENCOUNTER — Ambulatory Visit (HOSPITAL_BASED_OUTPATIENT_CLINIC_OR_DEPARTMENT_OTHER)
Admission: RE | Admit: 2015-03-31 | Discharge: 2015-03-31 | Disposition: A | Discharge status: Home / Self Care | Payer: No Typology Code available for payment source | Source: Ambulatory Visit | Attending: Physician Assistant | Admitting: Physician Assistant

## 2015-03-31 VITALS — BP 130/70 | HR 89 | Temp 97.8°F | Ht 65.0 in | Wt 141.4 lb

## 2015-03-31 DIAGNOSIS — Z86718 Personal history of other venous thrombosis and embolism: Secondary | ICD-10-CM | POA: Diagnosis not present

## 2015-03-31 DIAGNOSIS — M79662 Pain in left lower leg: Secondary | ICD-10-CM

## 2015-03-31 DIAGNOSIS — M79669 Pain in unspecified lower leg: Secondary | ICD-10-CM | POA: Insufficient documentation

## 2015-03-31 NOTE — Patient Instructions (Signed)
Please go downstairs for imaging -- appointment is at 1:30. Arrive 15 minutes early.   They will do your ultrasound and call me with results before letting you leave. We will come up with a treatment regimen based on results. My concern is there is a clot present.  If you develop any chest pain or SOB before imaging, please call 911.

## 2015-03-31 NOTE — Assessment & Plan Note (Signed)
Giving medical history and HPI given, US obtained STAT to r/o DVT. US reviewed and negative. Suspect muscular strain. RICE discussed. Patient is breast feeding. Recommended alternating ibuprofen and tylenol for pain. Topical Aspercreme. Follow-up if not resolving.

## 2015-03-31 NOTE — Progress Notes (Signed)
Patient with history of DVT and recent pregnancy presents to clinic today c/o left calf pain with swelling x 2 weeks after a 13 hour car-ride. Denies recent surgery. Is followed by Hematology but workup thus far is unremarkable. Patient does not she had to do a lot of climbing in and out the back seat of a small car and thinks she pulled a muscle. Denies chest pain or SOB.  Past Medical History  Diagnosis Date  . Endometriosis 2008    lap endometriosis fulgration- 2008  . Kidney stones 2014    Sx: cystoscopy w/retrograde pyelogram, ureteroscopy and stent placement  . UTI (lower urinary tract infection)   . PONV (postoperative nausea and vomiting)   . Family history of anesthesia complication     mother and father have N/V  . History of blood clots 01/12/2013    blood clot in right arm post renal stone surgery 01/12/13-on blood thinnners (Lovenox)  . Bleeding in early pregnancy 02/24/2014  . Hx of varicella   . Hemorrhoids   . Arrhythmia     non sustained VT needs cardiac monitoring in labor  . Hx of wisdom tooth extraction 2013  . Hx of left knee surgery 2007  . Postpartum care following vaginal delivery (6/7) 09/20/2014  . Intermittent palpitations   . Tachycardia   . Nonsustained ventricular tachycardia (HCC)   . Subchorionic hemorrhage, antepartum   . Personal history of DVT (deep vein thrombosis)     Current Outpatient Prescriptions on File Prior to Visit  Medication Sig Dispense Refill  . Prenatal Vit-Fe Fumarate-FA (PRENATAL MULTIVITAMIN) TABS tablet Take 1 tablet by mouth daily.     No current facility-administered medications on file prior to visit.    Allergies  Allergen Reactions  . Pineapple Anaphylaxis  . Codeine Other (See Comments)    Pt states this medication causes her bp to rise.    Leilani Merl [Oxymetazoline] Other (See Comments)    Pt states this medication causes her bp to drop.    Marland Kitchen Percocet [Oxycodone-Acetaminophen] Other (See Comments)    Pt  states this medication causes her to hallucinate.    . Latex Rash    Family History  Problem Relation Age of Onset  . Breast cancer Mother 39  . Gestational diabetes Mother   . Hypertension Mother   . Hyperlipidemia Mother   . Lung cancer Other     paternal side  . Diabetes Maternal Grandmother   . Heart disease Father     SBE, aorta replacement @ age 40  . Hypertension Father   . Hyperlipidemia Father   . Heart attack Maternal Grandfather   . Healthy Sister     x3    Social History   Social History  . Marital Status: Married    Spouse Name: N/A  . Number of Children: 2  . Years of Education: N/A   Social History Main Topics  . Smoking status: Never Smoker   . Smokeless tobacco: Never Used     Comment: never used product  . Alcohol Use: No  . Drug Use: No  . Sexual Activity:    Partners: Male    Birth Control/ Protection: None   Other Topics Concern  . None   Social History Narrative    Review of Systems - See HPI.  All other ROS are negative.  BP 130/70 mmHg  Pulse 89  Temp(Src) 97.8 F (36.6 C) (Oral)  Ht  (1.651 m)  Wt 141 lb  6.4 oz (64.139 kg)  BMI 23.53 kg/m2  SpO2 99%  Breastfeeding? Yes  Physical Exam  Constitutional: She is oriented to person, place, and time and well-developed, well-nourished, and in no distress.  HENT:  Head: Normocephalic and atraumatic.  Eyes: Conjunctivae are normal.  Cardiovascular: Normal rate, regular rhythm, normal heart sounds and intact distal pulses.   Pulses:      Dorsalis pedis pulses are 2+ on the right side, and 2+ on the left side.       Posterior tibial pulses are 2+ on the right side, and 2+ on the left side.  No notable difference between LLE and RLE. No pitting edema. Pain with flexion and extension. Palpable calf tenderness noted. Questionable Homan sign.  Pulmonary/Chest: Effort normal and breath sounds normal. She has no wheezes. She has no rales. She exhibits no tenderness.  Neurological: She  is alert and oriented to person, place, and time. No cranial nerve deficit.  Vitals reviewed.   No results found for this or any previous visit (from the past 2160 hour(s)).  Assessment/Plan: Calf pain Giving medical history and HPI given, US obtained STAT to r/o DVT. US reviewed and negative. Suspect muscular strain. RICE discussed. Patient is breast feeding. Recommended alternating ibuprofen and tylenol for pain. Topical Aspercreme. Follow-up if not resolving.

## 2015-03-31 NOTE — Progress Notes (Signed)
Pre visit review using our clinic review tool, if applicable. No additional management support is needed unless otherwise documented below in the visit note. 

## 2015-04-04 ENCOUNTER — Ambulatory Visit (INDEPENDENT_AMBULATORY_CARE_PROVIDER_SITE_OTHER): Payer: PRIVATE HEALTH INSURANCE | Admitting: Cardiology

## 2015-04-04 ENCOUNTER — Encounter: Payer: Self-pay | Admitting: Cardiology

## 2015-04-04 VITALS — BP 112/78 | HR 94 | Ht 65.0 in | Wt 141.2 lb

## 2015-04-04 DIAGNOSIS — I472 Ventricular tachycardia: Secondary | ICD-10-CM

## 2015-04-04 DIAGNOSIS — R000 Tachycardia, unspecified: Secondary | ICD-10-CM

## 2015-04-04 NOTE — Progress Notes (Signed)
Electrophysiology Office Note   Date:  04/04/2015   ID:  Jessica Diaz, DOB 1991-03-17, MRN [id number]  PCP:  [name], MD  Cardiologist:  Sherilyn Banker Primary Electrophysiologist:  Shira Bobst Jorja Loa, MD    Chief Complaint  Patient presents with  . Follow-up     History of Present Illness: Jessica Diaz is a 24 y.o. female who presents today for electrophysiology evaluation.   She presented to her cardiologist with tachycardia and palpitations during pregnancy. This is an 8-1/2 weeks when the monitor showed a 4 beat run of VT. PTT was normal at that time. She is currently 6 months postpartum. She was found to have a DVT in September 2015 and was put on the relative at that time. 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 She has not been taking this as she is worried about side effects with her breast-feeding. She has continued to have symptoms of palpitations. She states that she had a bad day 3 weeks ago where she thought she would have to leave work.   Today, she denies symptoms of chest pain, shortness of breath, orthopnea, PND, lower extremity edema, claudication, dizziness, presyncope, syncope, bleeding, or neurologic sequela. The patient is tolerating medications without difficulties and is otherwise without complaint today.    Past Medical History  Diagnosis Date  . Endometriosis 2008    lap endometriosis fulgration- 2008  . Kidney stones 2014    Sx: cystoscopy w/retrograde pyelogram, ureteroscopy and stent placement  . UTI (lower urinary tract infection)   . PONV (postoperative nausea and vomiting)   . Family history of anesthesia complication     mother and father have N/V  . History of blood clots 01/12/2013    blood clot in right arm post renal stone surgery 01/12/13-on blood thinnners (Lovenox)  . Bleeding in early pregnancy 02/24/2014  . Hx of varicella   . Hemorrhoids   . Arrhythmia     non sustained VT needs cardiac monitoring in labor    . Hx of wisdom tooth extraction 2013  . Hx of left knee surgery 2007  . Postpartum care following vaginal delivery (6/7) 09/20/2014  . Intermittent palpitations   . Tachycardia   . Nonsustained ventricular tachycardia (HCC)   . Subchorionic hemorrhage, antepartum   . Personal history of DVT (deep vein thrombosis)    Past Surgical History  Procedure Laterality Date  . Knee surgery Left 2007  . Laparoscopic endometriosis fulguration  2008  . Wisdom tooth extraction  2013    two lowers and one upper  . Cystoscopy with retrograde pyelogram, ureteroscopy and stent placement Right 01/12/2013    Procedure: CYSTOSCOPY WITH RETROGRADE PYELOGRAM, URETEROSCOPY AND STENT PLACEMENT;  Surgeon: Valetta Fuller, MD;  Location: [facility];  Service: Urology;  Laterality: Right;  with stone basket extraction  . Dilation and curettage, diagnostic / therapeutic  2008     Current Outpatient Prescriptions  Medication Sig Dispense Refill  . Prenatal Vit-Fe Fumarate-FA (PRENATAL MULTIVITAMIN) TABS tablet Take 1 tablet by mouth daily.     No current facility-administered medications for this visit.    Allergies:   Pineapple; Codeine; Decongestant; Percocet; and Latex   Social History:  The patient  reports that she has never smoked. She has never used smokeless tobacco. She reports that she does not drink alcohol or use illicit drugs.   Family History:  The patient's family history includes Breast cancer (age of onset: 70) in her mother; Diabetes  in her maternal grandmother; Gestational diabetes in her mother; Healthy in her sister; Heart attack in her maternal grandfather; Heart disease in her father; Hyperlipidemia in her father and mother; Hypertension in her father and mother; Lung cancer in her other.    ROS:  Please see the history of present illness.   Otherwise, review of systems is positive for palpitations.   All other systems are reviewed and negative.    PHYSICAL EXAM: VS:  There were no vitals  taken for this visit. , BMI There is no weight on file to calculate BMI. GEN: Well nourished, well developed, in no acute distress HEENT: normal Neck: no JVD, carotid bruits, or masses Cardiac: RRR; no murmurs, rubs, or gallops,no edema  Respiratory:  clear to auscultation bilaterally, normal work of breathing GI: soft, nontender, nondistended, + BS MS: no deformity or atrophy Skin: warm and dry Neuro:  Strength and sensation are intact Psych: euthymic mood, full affect  EKG:  EKG is ordered today. The ekg ordered today shows sinus rhythm, rate 94  Recent Labs: 10/19/2014: ALT 13; BUN 14; Creatinine, Ser 0.70; HGB 13.3; Platelets 245; Potassium 4.5; Sodium 142    Lipid Panel  No results found for: CHOL, TRIG, HDL, CHOLHDL, VLDL, LDLCALC, LDLDIRECT   Wt Readings from Last 3 Encounters:  03/31/15 141 lb 6.4 oz (64.139 kg)  01/09/15 145 lb (65.772 kg)  10/19/14 148 lb (67.132 kg)      Other studies Reviewed: Additional studies/ records that were reviewed today include: TTE 08/11/14  Review of the above records today demonstrates: EF 61%, trace MR, TR   ASSESSMENT AND PLAN:  1.  PVCs: continuing to have palpitations which occur at different frequencies every day. She continues to not want to take medications for these as she is breast-feeding at this point. She plans to breast feed for  1 year. She was prescribed) all but has not filled the prescription as she is again nervous about taking medications. I have told her that she does not need to take the propranolol on a daily basis and if she is having a bad day that she can take it which Jenah Vanasten hopefully decrease the amount of her palpitations. When she is done breast-feeding if she continues to have symptoms we'll repeat a Holter monitor to determine how many PVCs she is actually having. If she continues to have a high burden of PVCs we Jailon Schaible talk about the possibility of medications or ablation. She would like to wait until she is done  breast feeding to have this done.   Current medicines are reviewed at length with the patient today.   The patient does not have concerns regarding her medicines.  The following changes were made today:  none  Labs/ tests ordered today include:  No orders of the defined types were placed in this encounter.     Disposition:   FU with Cheria Sadiq 6 months  Signed, Xzaria Teo Jorja LoaMartin Serene Kopf, MD  04/04/2015 3:22 PM     [facility] [address] [place] [address] [phone] (office) [phone] (fax)

## 2015-04-04 NOTE — Patient Instructions (Signed)
Medication Instructions:    Your physician recommends that you continue on your current medications as directed. Please refer to the Current Medication list given to you today.  Labwork:  None ordered  Testing/Procedures:  None ordered  Follow-Up:  Your physician wants you to follow-up in: 6 months with Dr. Camnitz.  You will receive a reminder letter in the mail two months in advance. If you don't receive a letter, please call our office to schedule the follow-up appointment.  - If you need a refill on your cardiac medications before your next appointment, please call your pharmacy.    Thank you for choosing [facility]!!   Sherri Price, RN [phone]         

## 2015-04-07 ENCOUNTER — Ambulatory Visit (HOSPITAL_BASED_OUTPATIENT_CLINIC_OR_DEPARTMENT_OTHER): Payer: PRIVATE HEALTH INSURANCE

## 2015-04-07 ENCOUNTER — Ambulatory Visit (HOSPITAL_BASED_OUTPATIENT_CLINIC_OR_DEPARTMENT_OTHER): Payer: PRIVATE HEALTH INSURANCE | Admitting: Family

## 2015-04-07 ENCOUNTER — Encounter: Payer: Self-pay | Admitting: Family

## 2015-04-07 VITALS — BP 116/74 | HR 74 | Temp 97.7°F | Resp 20 | Ht 65.0 in | Wt 141.0 lb

## 2015-04-07 DIAGNOSIS — Z86718 Personal history of other venous thrombosis and embolism: Secondary | ICD-10-CM

## 2015-04-07 DIAGNOSIS — O99113 Other diseases of the blood and blood-forming organs and certain disorders involving the immune mechanism complicating pregnancy, third trimester: Secondary | ICD-10-CM

## 2015-04-07 DIAGNOSIS — D6859 Other primary thrombophilia: Secondary | ICD-10-CM

## 2015-04-07 DIAGNOSIS — I808 Phlebitis and thrombophlebitis of other sites: Secondary | ICD-10-CM

## 2015-04-07 DIAGNOSIS — I82611 Acute embolism and thrombosis of superficial veins of right upper extremity: Secondary | ICD-10-CM

## 2015-04-07 LAB — CBC WITH DIFFERENTIAL (CANCER CENTER ONLY)
BASO#: 0 10*3/uL (ref 0.0–0.2)
BASO%: 0.3 % (ref 0.0–2.0)
EOS%: 2.1 % (ref 0.0–7.0)
Eosinophils Absolute: 0.1 10*3/uL (ref 0.0–0.5)
HCT: 39.5 % (ref 34.8–46.6)
HGB: 13.2 g/dL (ref 11.6–15.9)
LYMPH#: 2 10*3/uL (ref 0.9–3.3)
LYMPH%: 31.8 % (ref 14.0–48.0)
MCH: 28.8 pg (ref 26.0–34.0)
MCHC: 33.4 g/dL (ref 32.0–36.0)
MCV: 86 fL (ref 81–101)
MONO#: 0.6 10*3/uL (ref 0.1–0.9)
MONO%: 9.3 % (ref 0.0–13.0)
NEUT#: 3.5 10*3/uL (ref 1.5–6.5)
NEUT%: 56.5 % (ref 39.6–80.0)
PLATELETS: 211 10*3/uL (ref 145–400)
RBC: 4.58 10*6/uL (ref 3.70–5.32)
RDW: 13.1 % (ref 11.1–15.7)
WBC: 6.1 10*3/uL (ref 3.9–10.0)

## 2015-04-07 LAB — CMP (CANCER CENTER ONLY)
ALT(SGPT): 17 U/L (ref 10–47)
AST: 17 U/L (ref 11–38)
Albumin: 4.4 g/dL (ref 3.3–5.5)
Alkaline Phosphatase: 83 U/L (ref 26–84)
BUN: 14 mg/dL (ref 7–22)
CHLORIDE: 103 meq/L (ref 98–108)
CO2: 26 mEq/L (ref 18–33)
CREATININE: 0.8 mg/dL (ref 0.6–1.2)
Calcium: 8.9 mg/dL (ref 8.0–10.3)
GLUCOSE: 87 mg/dL (ref 73–118)
Potassium: 4.2 mEq/L (ref 3.3–4.7)
SODIUM: 142 meq/L (ref 128–145)
Total Bilirubin: 0.6 mg/dl (ref 0.20–1.60)
Total Protein: 8.3 g/dL — ABNORMAL HIGH (ref 6.4–8.1)

## 2015-04-07 NOTE — Progress Notes (Signed)
Hematology and Oncology Follow Up Visit  Jessica Diaz [id number] 12/08/1990 24 y.o. 04/07/2015   Principle Diagnosis:  History of superficial thrombus of the right forearm  Current Therapy:   Observation    Interim History:  Ms. Jessica Diaz is here both her sweet boys today for a follow-up. She is still having problems with her heart "skipping beats." She has SOB and some mild chest pain with these episodes at times. She is being followed closely by cardiology and EP. She is currently breast feeding and does not want to take a beta blocker or aspirin. Plans to breast feed for another 6 months.  [place] to [place] and back. She developed swelling in her lower extremities worse in the left. Her doppler study was negative. There is no swelling or tenderness in her legs at this time. No numbness or tingling.  She has had no episodes of bleeding or bruising.  No fever, chills, n/v, rash, cough, dizziness, syncope, constipation, blood in urine or stool.  She is eating well and staying hydrated. Her weight is unchanged.    Medications:    Medication List    Notice  As of 04/07/2015  3:21 PM   You have not been prescribed any medications.      Allergies:  Allergies  Allergen Reactions  . Pineapple Anaphylaxis  . Codeine Other (See Comments)    Pt states this medication causes her bp to rise.    Jessica Diaz. Decongestant [Oxymetazoline] Other (See Comments)    Pt states this medication causes her bp to drop.    Jessica Diaz Kitchen. Percocet [Oxycodone-Acetaminophen] Other (See Comments)    Pt states this medication causes her to hallucinate.    . Latex Rash    Past Medical History, Surgical history, Social history, and Family History were reviewed and updated.  Review of Systems: All other 10 point review of systems is negative.   Physical Exam:  vitals were not taken for this visit.  Wt Readings from Last 3 Encounters:  04/04/15 141 lb 3.2 oz (64.048 kg)  03/31/15 141 lb 6.4 oz (64.139  kg)  01/09/15 145 lb (65.772 kg)    Ocular: Sclerae unicteric, pupils equal, round and reactive to light Ear-nose-throat: Oropharynx clear, dentition fair Lymphatic: No cervical or supraclavicular adenopathy Lungs no rales or rhonchi, good excursion bilaterally Heart regular rate and rhythm, no murmur appreciated Abd soft, nontender, positive bowel sounds MSK no focal spinal tenderness, no joint edema Neuro: non-focal, well-oriented, appropriate affect Breasts: Deferred  Lab Results  Component Value Date   WBC 6.1 04/07/2015   HGB 13.2 04/07/2015   HCT 39.5 04/07/2015   MCV 86 04/07/2015   PLT 211 04/07/2015   No results found for: FERRITIN, IRON, TIBC, UIBC, IRONPCTSAT Lab Results  Component Value Date   RBC 4.58 04/07/2015   No results found for: KPAFRELGTCHN, LAMBDASER, KAPLAMBRATIO No results found for: IGGSERUM, IGA, IGMSERUM No results found for: Jessica HousekeeperOTALPROTELP, ALBUMINELP, A1GS, A2GS, BETS, BETA2SER, GAMS, MSPIKE, SPEI   Chemistry      Component Value Date/Time   NA 142 10/19/2014 1350   K 4.5 10/19/2014 1350   CL 102 10/19/2014 1350   CO2 26 10/19/2014 1350   BUN 14 10/19/2014 1350   CREATININE 0.70 10/19/2014 1350      Component Value Date/Time   CALCIUM 9.6 10/19/2014 1350   ALKPHOS 112 10/19/2014 1350   AST 11 10/19/2014 1350   ALT 13 10/19/2014 1350   BILITOT 0.3 10/19/2014 1350     Impression  and Plan: [name] is a 24 year old white female with a history of a superficial thrombus in the right forearm 1 year ago and was on Xarelto for 3 months. We also treated her with Lovenox during and then 6 weeks after her pregnancy.  Her baby is now 67 months old. She is not on any anticoagulation or aspirin at this time. She is still breast feeding and plans to until her son is 45 year old.  She is still having palpitations and is seeing cardiology. She is currently not on a beta blocker because of the breast feeding. They are following along with her closely.  She  had an episodes of bilateral leg swelling after driving to [place] and back. Her doppler study was negative. She has no swelling or tenderness in her lower extremities at this time.  At this point we will follow-up with her as needed and if she decides to become pregnant again. She will contact our office. She knows to call us with any questions or concerns.. We can certainly see her when needed.   Jessica Mosher, NP 12/23/20163:21 PM

## 2015-04-14 LAB — RFX DRVVT SCR W/RFLX CONF 1:1 MIX: dRVVT Screen: 35 s (ref <=45)

## 2015-04-14 LAB — LUPUS ANTICOAGULANT PANEL

## 2015-04-14 LAB — RFX PTT-LA W/RFX TO HEX PHASE CONF: PTT-LA Screen: 42 s — ABNORMAL HIGH (ref <=40)

## 2015-04-14 LAB — RFLX HEXAGONAL PHASE CONFIRM: HEXAGONAL PHASE CONFIRM: NEGATIVE

## 2015-05-12 ENCOUNTER — Telehealth: Payer: Self-pay | Admitting: Cardiology

## 2015-05-12 DIAGNOSIS — I493 Ventricular premature depolarization: Secondary | ICD-10-CM

## 2015-05-12 NOTE — Telephone Encounter (Signed)
  Patient c/o Palpitations:  High priority if patient c/o lightheadedness and shortness of breath.  1. How long have you been having palpitations? This week   2. Are you currently experiencing lightheadedness and shortness of breath? Lightheadedness and Sob - Wednesday 1/25- felt like passing out   3. Have you checked your BP and heart rate? (document readings) no   4. Are you experiencing any other symptoms? Chest discomfort

## 2015-05-12 NOTE — Telephone Encounter (Signed)
Patient called to report to Dr. Elberta Fortis that she "has had a rough week." She has had intermittent lightheadedness, SOB and palpitations all week - more than usual. On 2022-09-04, she was "scared to death" when she thought she was going to pass out while breastfeeding.  Confirmed with patient she is still unable to start medications due to breastfeeding and her baby's inability to take a bottle. Patient is currently asymptomatic. She has no vital signs to report. Instructed patient to go to the ED over the weekend if symptoms worsen or CP develops as well. She understands Dr. Elberta Fortis is out of the office today.  She awaits a call next week with recommendations.

## 2015-05-17 NOTE — Telephone Encounter (Signed)
Called the patient who has been having worsening palpitations but not willing to try medical management.  Can we fit her with a 30 day monitor to see if we can correlate symptoms with PVCs.  Thanks.

## 2015-05-17 NOTE — Telephone Encounter (Addendum)
Pt aware office will contact her to schedule event monitor.  She wants to let Dr. Elberta Fortis know that her father is seen by Dr Johney Frame for PVCs (not r/t his valve problems) and LINQ implant.  States that her father's mother and sister also had PVC issues. States that Dr. Johney Frame thinks this may be hereditary. She is aware I will inform Dr. Elberta Fortis.

## 2015-05-19 ENCOUNTER — Other Ambulatory Visit: Payer: Self-pay | Admitting: *Deleted

## 2015-05-22 ENCOUNTER — Ambulatory Visit (INDEPENDENT_AMBULATORY_CARE_PROVIDER_SITE_OTHER): Payer: PRIVATE HEALTH INSURANCE

## 2015-05-22 DIAGNOSIS — I493 Ventricular premature depolarization: Secondary | ICD-10-CM | POA: Diagnosis not present

## 2015-10-13 ENCOUNTER — Telehealth: Payer: Self-pay | Admitting: Family Medicine

## 2015-10-13 NOTE — Telephone Encounter (Signed)
Received a call from nurse triage. Pt complaining of possible mastitis of breast with discomfort. Triage outcome:  visit within 24 hours. No erythema or fever per triage. Mild muscle aches.  Patient to continue home therapy over night- nsaids, cold compresses for relief. If pt worsens over night she should be seen in the ED/UC, if remains stable without fever or redness, she can be seen in the Saturday clinic tomorrow morning. I have scheduled her in the 9:45 time slot.  Electronically Signed by: Felix Pacinienee Kuneff, DO [facility]

## 2015-10-14 ENCOUNTER — Encounter: Payer: Self-pay | Admitting: Family Medicine

## 2015-10-14 ENCOUNTER — Ambulatory Visit (INDEPENDENT_AMBULATORY_CARE_PROVIDER_SITE_OTHER): Payer: PRIVATE HEALTH INSURANCE | Admitting: Family Medicine

## 2015-10-14 VITALS — BP 118/68 | HR 79 | Temp 98.2°F | Resp 16 | Wt 133.0 lb

## 2015-10-14 DIAGNOSIS — N61 Mastitis without abscess: Secondary | ICD-10-CM | POA: Diagnosis not present

## 2015-10-14 MED ORDER — CEPHALEXIN 500 MG PO CAPS: 500.0000 mg | ORAL_CAPSULE | Freq: Four times a day (QID) | ORAL | Status: AC

## 2015-10-14 NOTE — Patient Instructions (Signed)
Mastitis  Mastitis is inflammation of the breast tissue. It occurs most often in women who are breastfeeding, but it can also affect other women, and even sometimes men.  CAUSES   Mastitis is usually caused by a bacterial infection. Bacteria enter the breast tissue through cuts or openings in the skin. Typically, this occurs with breastfeeding because of cracked or irritated skin. Sometimes, it can occur even when there is no opening in the skin. It can be associated with plugged milk (lactiferous) ducts. Nipple piercing can also lead to mastitis. Also, some forms of breast cancer can cause mastitis.  SIGNS AND SYMPTOMS   · Swelling, redness, tenderness, and pain in an area of the breast.  · Swelling of the glands under the arm on the same side.  · Fever.  If an infection is allowed to progress, a collection of pus (abscess) may develop.  DIAGNOSIS   Your health care provider can usually diagnose mastitis based on your symptoms and a physical exam. Tests may be done to help confirm the diagnosis. These may include:   · Removal of pus from the breast by applying pressure to the area. This pus can be examined in the lab to determine which bacteria are present. If an abscess has developed, the fluid in the abscess can be removed with a needle. This can also be used to confirm the diagnosis and determine the bacteria present. In most cases, pus will not be present.  · Blood tests to determine if your body is fighting a bacterial infection.  · Mammogram or ultrasound tests to rule out other problems or diseases.  TREATMENT   Antibiotic medicine is used to treat a bacterial infection. Your health care provider will determine which bacteria are most likely causing the infection and will select an appropriate antibiotic. This is sometimes changed based on the results of tests performed to identify the bacteria, or if there is no response to the antibiotic selected. Antibiotics are usually given by mouth. You may also be  given medicine for pain.  Mastitis that occurs with breastfeeding will sometimes go away on its own, so your health care provider may choose to wait 24 hours after first seeing you to decide whether a prescription medicine is needed.  HOME CARE INSTRUCTIONS   · Only take over-the-counter or prescription medicines for pain, fever, or discomfort as directed by your health care provider.  · If your health care provider prescribed an antibiotic, take the medicine as directed. Make sure you finish it even if you start to feel better.  · Do not wear a tight or underwire bra. Wear a soft, supportive bra.  · Increase your fluid intake, especially if you have a fever.  · Women who are breastfeeding should follow these instructions:    Continue to empty the breast. Your health care provider can tell you whether this milk is safe for your infant or needs to be thrown out. You may be told to stop nursing until your health care provider thinks it is safe for your baby. Use a breast pump if you are advised to stop nursing.    Keep your nipples clean and dry.    Empty the first breast completely before going to the other breast. If your baby is not emptying your breasts completely for some reason, use a breast pump to empty your breasts.    If you go back to work, pump your breasts while at work to stay in time with your nursing schedule.      Avoid allowing your breasts to become overly filled with milk (engorged).  SEEK MEDICAL CARE IF:   · You have pus-like discharge from the breast.  · Your symptoms do not improve with the treatment prescribed by your health care provider within 2 days.  SEEK IMMEDIATE MEDICAL CARE IF:   · Your pain and swelling are getting worse.  · You have pain that is not controlled with medicine.  · You have a red line extending from the breast toward your armpit.  · You have a fever or persistent symptoms for more than 2-3 days.  · You have a fever and your symptoms suddenly get worse.     This information  is not intended to replace advice given to you by your health care provider. Make sure you discuss any questions you have with your health care provider.     Document Released: 04/01/2005 Document Revised: 04/06/2013 Document Reviewed: 10/30/2012  [employer]vier Interactive Patient Education ©2016 [employer]

## 2015-10-14 NOTE — Progress Notes (Signed)
Pre visit review using our clinic review tool, if applicable. No additional management support is needed unless otherwise documented below in the visit note. 

## 2015-10-14 NOTE — Progress Notes (Signed)
Patient ID: Jessica Diaz, female   DOB: 10/24/1990, 25 y.o.   MRN: [id number]    Jessica Diaz , 03/05/1991, 25 y.o., female MRN: [id number]  CC: Right breast pain  Subjective: Pt presents for an acute OV with complaints of right breast pain  of 2 days duration.  Associated symptoms include redness, warm to touch and yellow drainage. Pt has tried tylenol for discomfort. She is breast feeding her > 26 year old child. She has tried using cabbage leaves, hot showers, expression/massage and cold compresses. She had seem some mild improvement, but it is not much better. She denies fever, nausea, vomit. She admits to mild chills and achy feeling.   Allergies  Allergen Reactions  . Pineapple Anaphylaxis  . Codeine Other (See Comments)    Pt states this medication causes her bp to rise.    [name] Other (See Comments)    Pt states this medication causes her bp to drop.    Marland Kitchen. Percocet [Oxycodone-Acetaminophen] Other (See Comments)    Pt states this medication causes her to hallucinate.    . Latex Rash   Social History  Substance Use Topics  . Smoking status: Never Smoker   . Smokeless tobacco: Never Used     Comment: never used product  . Alcohol Use: No   Past Medical History  Diagnosis Date  . Endometriosis 2008    lap endometriosis fulgration- 2008  . Kidney stones 2014    Sx: cystoscopy w/retrograde pyelogram, ureteroscopy and stent placement  . UTI (lower urinary tract infection)   . PONV (postoperative nausea and vomiting)   . Family history of anesthesia complication     mother and father have N/V  . History of blood clots 01/12/2013    blood clot in right arm post renal stone surgery 01/12/13-on blood thinnners (Lovenox)  . Bleeding in early pregnancy 02/24/2014  . Hx of varicella   . Hemorrhoids   . Arrhythmia     non sustained VT needs cardiac monitoring in labor  . Hx of wisdom tooth extraction 2013  . Hx of left knee surgery 2007  . Postpartum  care following vaginal delivery (6/7) 09/20/2014  . Intermittent palpitations   . Tachycardia   . Nonsustained ventricular tachycardia (HCC)   . Subchorionic hemorrhage, antepartum   . Personal history of DVT (deep vein thrombosis)    Past Surgical History  Procedure Laterality Date  . Knee surgery Left 2007  . Laparoscopic endometriosis fulguration  2008  . Wisdom tooth extraction  2013    two lowers and one upper  . Cystoscopy with retrograde pyelogram, ureteroscopy and stent placement Right 01/12/2013    Procedure: CYSTOSCOPY WITH RETROGRADE PYELOGRAM, URETEROSCOPY AND STENT PLACEMENT;  Surgeon: Valetta Fulleravid S Grapey, MD;  Location: [facility];  Service: Urology;  Laterality: Right;  with stone basket extraction  . Dilation and curettage, diagnostic / therapeutic  2008   Family History  Problem Relation Age of Onset  . Breast cancer Mother 1736  . Gestational diabetes Mother   . Hypertension Mother   . Hyperlipidemia Mother   . Lung cancer Other     paternal side  . Diabetes Maternal Grandmother   . Heart disease Father     SBE, aorta replacement @ age 26  . Hypertension Father   . Hyperlipidemia Father   . Heart attack Maternal Grandfather   . Healthy Sister     x3     Medication List    Notice  As  of 10/14/2015 10:31 AM   You have not been prescribed any medications.       ROS: Negative, with the exception of above mentioned in HPI   Objective:  BP 118/68 mmHg  Pulse 79  Temp(Src) 98.2 F (36.8 C) (Oral)  Resp 16  Wt 133 lb (60.328 kg)  SpO2 97%  Breastfeeding? Yes Body mass index is 22.13 kg/(m^2). Gen: Afebrile. No acute distress. Nontoxic in appearance, well developed, well nourished breast feeding female. Pleasant  HENT: AT. [place].  MMM, no oral lesions.  Eyes:Pupils Equal Round Reactive to light, Extraocular movements intact,  Conjunctiva without redness, discharge or icterus. Neck/lymp/endocrine: Supple, no lymphadenopathy CV: RRR Skin: no rashes, purpura or  petechiae. Mild erythema midline right breast just above areola. TTP moderate. No abnormal nipple drainage expressed. No axilla nodes palpated. No masses.  Neuro:  Normal gait. PERLA. EOMi. Alert. Oriented x3   Assessment/Plan: Jessica Diaz is a 25 y.o. female present for acute OV for  Mastitis - Discussed mastitis treatment of abx for 10-14 days.  - Continue hot showers, express/massage, cold compresses and start abx. Monitor area for worsening infection, fever, chills, nausea, increased redness or pain. If worsening or not improving on abx pt to be seen immediately. Nsaids if needed short term.  - continue breast feeding.  - cephALEXin (KEFLEX) 500 MG capsule; Take 1 capsule (500 mg total) by mouth 4 (four) times daily.  Dispense: 52 capsule; Refill: 0 - F/U 3 days with PCP    > 25 minutes spent with patient, >50% of time spent face to face counseling patient and coordinating care.  electronically signed by:  Felix Pacinienee Kuneff, DO  [facility] - [place]

## 2015-10-23 ENCOUNTER — Ambulatory Visit: Payer: PRIVATE HEALTH INSURANCE | Admitting: Family Medicine

## 2016-01-17 ENCOUNTER — Ambulatory Visit (INDEPENDENT_AMBULATORY_CARE_PROVIDER_SITE_OTHER): Payer: PRIVATE HEALTH INSURANCE | Admitting: Family Medicine

## 2016-01-17 ENCOUNTER — Encounter: Payer: Self-pay | Admitting: Family Medicine

## 2016-01-17 DIAGNOSIS — M545 Low back pain, unspecified: Secondary | ICD-10-CM

## 2016-01-17 DIAGNOSIS — M62838 Other muscle spasm: Secondary | ICD-10-CM | POA: Diagnosis not present

## 2016-01-17 MED ORDER — IBUPROFEN 800 MG PO TABS: 800.0000 mg | ORAL_TABLET | Freq: Three times a day (TID) | ORAL | 0 refills | Status: DC | PRN

## 2016-01-17 NOTE — Patient Instructions (Signed)
Follow up as needed Start the Ibuprofen 3x/day- with food- for inflammation and pain relief You can take tylenol in between ibuprofen doses for additional pain relief No muscle relaxers due to breast feeding- sorry!! Drink plenty of fluids Heating pad for back and neck pain Call with any questions or concerns Hang in there!!!
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
## 2016-01-17 NOTE — Progress Notes (Signed)
Pre visit review using our clinic review tool, if applicable. No additional management support is needed unless otherwise documented below in the visit note. 

## 2016-01-30 ENCOUNTER — Telehealth: Payer: Self-pay | Admitting: Family Medicine

## 2016-01-30 NOTE — Telephone Encounter (Signed)
Pt states that she has been vomiting since 4am and asking what could she take since she is breast feeding

## 2016-01-30 NOTE — Telephone Encounter (Signed)
Patient notified of PCP recommendations and is agreement and expresses an understanding.  

## 2016-01-30 NOTE — Telephone Encounter (Signed)
Ok to take OTC Emetrol for nausea and vomiting- this is safe during breastfeeding.  Sips of ginger ale will also help as ginger naturally settles the stomach

## 2016-01-31 LAB — HM PAP SMEAR: HM Pap smear: NORMAL

## 2016-06-05 ENCOUNTER — Encounter: Payer: Self-pay | Admitting: *Deleted

## 2016-09-13 DIAGNOSIS — Z8619 Personal history of other infectious and parasitic diseases: Secondary | ICD-10-CM

## 2016-09-13 HISTORY — DX: Personal history of other infectious and parasitic diseases: Z86.19

## 2016-09-30 ENCOUNTER — Ambulatory Visit (INDEPENDENT_AMBULATORY_CARE_PROVIDER_SITE_OTHER): Payer: PRIVATE HEALTH INSURANCE | Admitting: Physician Assistant

## 2016-09-30 ENCOUNTER — Encounter: Payer: Self-pay | Admitting: Physician Assistant

## 2016-09-30 ENCOUNTER — Telehealth: Payer: Self-pay | Admitting: Family Medicine

## 2016-09-30 VITALS — BP 110/72 | HR 77 | Temp 98.0°F | Resp 14 | Ht 65.0 in | Wt 133.0 lb

## 2016-09-30 DIAGNOSIS — B0089 Other herpesviral infection: Secondary | ICD-10-CM

## 2016-09-30 MED ORDER — ACYCLOVIR 400 MG PO TABS: 400.0000 mg | ORAL_TABLET | Freq: Three times a day (TID) | ORAL | 0 refills | Status: DC

## 2016-09-30 NOTE — Telephone Encounter (Signed)
Spoke with patient regarding symptoms. Patient reports possibly being bitten on finger by insect on Friday (09/27/16) while gardening. Since being evaluated at an Urgent Care, is experiencing "flu like symptoms", low grade fever, several blisters and hand edema. Patient requesting appointment, scheduled with Malva Coganody Martin, PA-C for today.

## 2016-09-30 NOTE — Patient Instructions (Addendum)
Please take the antiviral medication as directed. No indication for the Keflex as there is not evidence of bacterial infections. Alternate tylenol and ibuprofen for fever and pain. Topical Aspercreme with lidocaine may also be beneficial.  Pump and dump breast mild while on this medication  Follow-up with me on Friday. If anything acutely worsens, please go to the ER.  Herpetic Whitlow Herpetic whitlow is an infection that affects the skin. It most commonly affects the fingers. The infection can recur, but the first occurrence is usually the most severe. What are the causes? This condition is caused by herpes simplex virus (HSV) type 1 and HSV type 2. You can get herpetic whitlow if body fluids that are infected with either of these viruses get into a break in the skin. HSV commonly affects the mouth and genitals, but it can affect many other parts of the body. Most people who get herpetic whitlow have an HSV infection in another part of the body that spreads to the fingers through a cut. Health care workers can get herpetic whitlow from touching the mouths of patients who have oral herpes. What increases the risk? This condition is more likely to develop in:  People who have an HSV infection.  People who work in the health care industry, particularly in dentistry.  What are the signs or symptoms? Symptoms of this condition usually develop 2-20 days after exposure to the virus. Early symptoms include:  Pain, burning, or tingling in the infected area.  Fever.  Redness.  Swelling.  About 7-10 days after symptoms begin to appear, the lymph nodes may swell, and rice-sized fluid-filled bumps will develop on the infected area. These bumps will develop into sores or break open. Symptoms usually improve 10-14 days later when the sores crust over and heal. How is this diagnosed? This condition may be diagnosed with a physical exam. A sample of your skin or your blood may also be taken for  testing. How is this treated? This condition goes away on its own. Your health care provider may prescribe topical or oral antiviral medicines to relieve symptoms and to prevent the infection from spreading to others. Follow these instructions at home:  Take or apply medicines only as directed by your health care provider.  Wash your hands often.  If you work in the health care industry, Product manager.  Apply ice to the affected area: ? Put ice in a plastic bag. ? Place a towel between your skin and the bag. ? Leave the ice on for 20 minutes, 2-3 times per day.  The infection can spread to other people. It can also spread to other areas of your body, especially to your mouth and your genital area. To keep it from spreading: ? Cover the affected area with a bandage (dressing). If you work in the health care industry, Product manager. ? Avoid close contact with other people until the bumps heal. ? Do not share towels and washcloths. ? Do not touch the bumps with your other hand or pick at your scabs. ? Do not touch your eyes, mouth, or genital area unless you wash your hands first. Contact a health care provider if:  The infection has spread to another area of your body.  Your symptoms become severe.  The infection recurs. This information is not intended to replace advice given to you by your health care provider. Make sure you discuss any questions you have with your health care provider. Document Released: 06/22/2002 Document Revised: 11/22/2015 Document  Reviewed: 01/11/2014 Elsevier Interactive Patient Education  2017 [employer]

## 2016-09-30 NOTE — Progress Notes (Signed)
Pre visit review using our clinic review tool, if applicable. No additional management support is needed unless otherwise documented below in the visit note. 

## 2016-09-30 NOTE — Telephone Encounter (Signed)
Patient Name: Jessica PitchLEXIS Jessica Diaz  DOB: 08/15/1990    Initial Comment Caller states has 2 small blisters on finger, finger very swollen. She went to urg care and they gave her abx and cream. Her arm is swollen and has 99 fever   Nurse Assessment  Nurse: Stefano GaulStringer, RN, Dwana CurdVera Date/Time (Eastern Time): 09/30/2016 8:55:43 AM  Confirm and document reason for call. If symptomatic, describe symptoms. ---Caller states she went to the urgent care on yesterday. She was gardening outside. Had 2 blisters on her left ring finger on Saturday. Woke up yesterday and she had multiple blisters on her finger and her whole finger was swollen. She was prescribed antibiotics yesterday but she has not gotten it filled yet. Has pain all the way to her elbow. Body aches. Temp 99.  Does the patient have any new or worsening symptoms? ---Yes  Will a triage be completed? ---Yes  Related visit to physician within the last 2 weeks? ---Yes  Does the PT have any chronic conditions? (i.e. diabetes, asthma, etc.) ---No  Is the patient pregnant or possibly pregnant? (Ask all females between the ages of 7012-55) ---No  Is this a behavioral health or substance abuse call? ---No     Guidelines    Guideline Title Affirmed Question Affirmed Notes  Wound Infection [1] Finger wound AND [2] entire finger swollen    Final Disposition User   See Physician within 4 Hours (or PCP triage) Stefano GaulStringer, RN, Vera    Comments  Pt states she is 45 min away from the office  [facility]. Pt would prefer to come to [place] Please call pt back regarding appt. she is 45 min away from the office   Referrals  GO TO FACILITY REFUSED   Disagree/Comply: Disagree  Disagree/Comply Reason: Wait and see

## 2016-09-30 NOTE — Progress Notes (Signed)
Patient presents to clinic today c/o potential insect bite of left ring finger first noted this past weekend. Noted significant blistering of the area. Was evaluated in Urgent Care yesterday and told that symptoms seemed consistent with posion ivy. Was given Keflex and steroid cream. Has not started medication as she wanted second opinion. Patient has noted low grade fever and malaise with joint aches since yesterday. Denies new lesion. Has been taking tylenol with relief.  Past Medical History:  Diagnosis Date  . Arrhythmia    non sustained VT needs cardiac monitoring in labor  . Bleeding in early pregnancy 02/24/2014  . Endometriosis 2008   lap endometriosis fulgration- 2008  . Family history of anesthesia complication    mother and father have N/V  . Hemorrhoids   . History of blood clots 01/12/2013   blood clot in right arm post renal stone surgery 01/12/13-on blood thinnners (Lovenox)  . Hx of left knee surgery 2007  . Hx of varicella   . Hx of wisdom tooth extraction 2013  . Intermittent palpitations   . Kidney stones 2014   Sx: cystoscopy w/retrograde pyelogram, ureteroscopy and stent placement  . Nonsustained ventricular tachycardia (HCC)   . Personal history of DVT (deep vein thrombosis)   . PONV (postoperative nausea and vomiting)   . Postpartum care following vaginal delivery (6/7) 09/20/2014  . Subchorionic hemorrhage, antepartum   . Tachycardia   . UTI (lower urinary tract infection)     Current Outpatient Prescriptions on File Prior to Visit  Medication Sig Dispense Refill  . ibuprofen (ADVIL,MOTRIN) 800 MG tablet Take 1 tablet (800 mg total) by mouth every 8 (eight) hours as needed. 60 tablet 0   No current facility-administered medications on file prior to visit.     Allergies  Allergen Reactions  . Pineapple Anaphylaxis  . Codeine Other (See Comments)    Pt states this medication causes her bp to rise.    Leilani Merl [Oxymetazoline] Other (See Comments)      Pt states this medication causes her bp to drop.    Marland Kitchen Percocet [Oxycodone-Acetaminophen] Other (See Comments)    Pt states this medication causes her to hallucinate.    . Latex Rash    Family History  Problem Relation Age of Onset  . Heart attack Maternal Grandfather   . Breast cancer Mother 59  . Gestational diabetes Mother   . Hypertension Mother   . Hyperlipidemia Mother   . Heart disease Father        SBE, aorta replacement @ age 36  . Hypertension Father   . Hyperlipidemia Father   . Lung cancer Other        paternal side  . Diabetes Maternal Grandmother   . Healthy Sister        x3    Social History   Social History  . Marital status: Married    Spouse name: N/A  . Number of children: 2  . Years of education: N/A   Social History Main Topics  . Smoking status: Never Smoker  . Smokeless tobacco: Never Used     Comment: never used product  . Alcohol use No  . Drug use: No  . Sexual activity: Yes    Partners: Male    Birth control/ protection: None   Other Topics Concern  . None   Social History Narrative  . None   Review of Systems - See HPI.  All other ROS are negative.  BP 110/72  Pulse 77   Temp 98 F (36.7 C) (Oral)   Resp 14   Ht 5\' 5"  (1.651 m)   Wt 133 lb (60.3 kg)   SpO2 99%   BMI 22.13 kg/m   Physical Exam  Constitutional: She is oriented to person, place, and time and well-developed, well-nourished, and in no distress.  HENT:  Head: Normocephalic and atraumatic.  Eyes: Conjunctivae are normal.  Cardiovascular: Normal rate, regular rhythm, normal heart sounds and intact distal pulses.   Pulmonary/Chest: Effort normal and breath sounds normal. No respiratory distress. She has no wheezes. She has no rales. She exhibits no tenderness.  Neurological: She is alert and oriented to person, place, and time.  Skin: Skin is warm and dry.     Psychiatric: Affect normal.  Vitals reviewed.   Assessment/Plan: 1. Herpetic  whitlow Discussed with Dr. Beverely Lowabori who also examined lesion and is in agreement with diagnosis of herpetic whitlow. Start Acyclovir. Supportive measures and OTC medications reviewed. ER precautions discussed. Follow-up discussed.    Piedad ClimesMartin, Glenyce Randle Cody, PA-C

## 2016-10-02 ENCOUNTER — Telehealth: Payer: Self-pay | Admitting: Family Medicine

## 2016-10-02 NOTE — Telephone Encounter (Signed)
Would need to stop Acyclovir -- could be medication reaction or reaction to a dye in the pill? -- rash should subside as medication is out of system. This is a likely culprit if she has had no other recent changes -- We can either assess in office or have her send me a picture of the rash to her MyChart for me to review. Any other symptoms? How is the herpetic lesion of her finger?

## 2016-10-02 NOTE — Telephone Encounter (Signed)
Advised patient to stop the Acyclovir for medication reaction. She is not having any other symptoms. She will attempt to take a picture of the rash and send it on My chart message. Her blister still looks ugly as before. No changes to the blister.

## 2016-10-02 NOTE — Telephone Encounter (Signed)
Patient states since starting the Acyclovir she developed a rash on her arms, abdomen, chest, wrist and thighs. She has used the Cortisone cream Please advise

## 2016-10-02 NOTE — Telephone Encounter (Signed)
Pt called states that she has now developed a rash over the body [facility] and not sure if was due to Rx she had started, pt asking what does she need to do, please advise

## 2016-11-07 ENCOUNTER — Ambulatory Visit (INDEPENDENT_AMBULATORY_CARE_PROVIDER_SITE_OTHER): Payer: PRIVATE HEALTH INSURANCE | Admitting: Family Medicine

## 2016-11-07 ENCOUNTER — Encounter: Payer: Self-pay | Admitting: Physician Assistant

## 2016-11-07 ENCOUNTER — Encounter: Payer: Self-pay | Admitting: Family Medicine

## 2016-11-07 ENCOUNTER — Telehealth: Payer: Self-pay | Admitting: Family Medicine

## 2016-11-07 VITALS — BP 101/61 | HR 78 | Temp 98.3°F | Resp 16 | Ht 65.0 in | Wt 131.1 lb

## 2016-11-07 DIAGNOSIS — R21 Rash and other nonspecific skin eruption: Secondary | ICD-10-CM

## 2016-11-07 MED ORDER — VALACYCLOVIR HCL 1 G PO TABS: 1000.0000 mg | ORAL_TABLET | Freq: Three times a day (TID) | ORAL | 0 refills | Status: DC

## 2016-11-07 MED ORDER — TRIAMCINOLONE ACETONIDE 0.1 % EX OINT: 1.0000 "application " | TOPICAL_OINTMENT | Freq: Two times a day (BID) | CUTANEOUS | 1 refills | Status: DC

## 2016-11-07 NOTE — Telephone Encounter (Signed)
Spoke with patient and she states the area did not fully resolve. Now the area has multiple areas. Advised patient she would need an another appointment for evaluation. She states it is too far. Advised to upload a picture on My chart for provider to evaluation. She is agreeable.

## 2016-11-07 NOTE — Telephone Encounter (Signed)
Picture received in mychart. [facility] Encounter closed.

## 2016-11-07 NOTE — Patient Instructions (Signed)
Please schedule your physical at your convenience We'll call you with your Derm appt- you can always cancel if you want Start the Valtrex 3x/day x7 days for possible herpetic whitlow vs shingles Use the Triamcinolone ointment twice daily for the itching Call with any questions or concerns Hang in there!!!

## 2016-11-07 NOTE — Telephone Encounter (Signed)
Pt states that that her finger has gotten worse since [facility], pt states it started to go away but now has came back, pt asking what should she do, please advise.

## 2016-11-07 NOTE — Progress Notes (Signed)
   Subjective:    Patient ID: Jessica Diaz, female    DOB: 03/22/1991, 26 y.o.   MRN: [id number]  HPI Blisters- pt reports that the blisters never completely resolved after taking the Valtrex 6 months ago.  Noticed yesterday that she has increased number of blisters.  'it itches like crazy'.  Mildly TTP.  Pt has hx of chicken pox.     Review of Systems For ROS see HPI     Objective:   Physical Exam  Constitutional: She is oriented to person, place, and time. She appears well-developed and well-nourished. No distress.  HENT:  Head: Normocephalic and atraumatic.  Cardiovascular: Intact distal pulses.   Neurological: She is alert and oriented to person, place, and time.  Skin: Skin is warm and dry. Rash (vesicular rash w/ erythematous base on dorsum of L ring finger, mildly TTP) noted.  Psychiatric: She has a normal mood and affect. Her behavior is normal. Thought content normal.  Vitals reviewed.         Assessment & Plan:  Skin eruption- pt w/ recurrent vesicular eruption on L ring finger.  Appearance is consistent w/ herpetic whitlow vs zoster vs eczema (although this is quite localized for eczema).  Restart anti-viral (Valtrex), start Triamcinolone for itching, and refer to derm.  Reviewed supportive care and red flags that should prompt return.  Pt expressed understanding and is in agreement w/ plan.

## 2016-11-07 NOTE — Progress Notes (Signed)
Pre visit review using our clinic review tool, if applicable. No additional management support is needed unless otherwise documented below in the visit note. 

## 2016-11-18 NOTE — Progress Notes (Addendum)
Cardiology Office Note Date:  11/18/2016  Patient ID:  Jessica Diaz, DOB 05/24/1990, MRN [id number] PCP:  Sheliah Hatchabori, Katherine E, MD  Cardiologist:  Dr. Jacinto HalimGanji, she reports she was released from his care and recommended to continue f/u here as needed Electrophysiologist: Dr. Elberta Fortisamnitz    Chief Complaint: palpitations  History of Present Illness: Jessica Diaz is a 26 y.o. female with history of DVT, comes in today to be seen for Dr. Elberta Fortisamnitz, last seen by him in Dec 2016 at that time she had presented to her cardiologist back then with tachycardia and palpitations during pregnancy. A monitor showed a 4 beat run of VT. ETT was normal 01/02/15, her TTE on 08/11/14 noted LVEF 61%, mild conc LVH, trace MR/TR She was 6 months postpartum. She was found to have a DVT in September 2015 and was treated with Xarelto. 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 Dr. Elberta Fortisamnitz noted family hx of her maternal grandfather that died young at 3834 from MI., otherwise no hx of early or SCD, her father has Afib and VHD 2/2 an endocarditis.  She comes in today to be seen for Dr. Elberta Fortisamnitz with increased palpitations.  She had been having the palpitations all along but since she stopped breast feeding in June they seem to have gotten worse.  She states it feels like extra beats/more though like missed beats or an empty feeling that can make her feel weak for that moment.  Back in February she had syncopal event, was with the flu, had been started on Tamiflu, woke feeling like something was different though not particularly palpitations more or less then usual, she went to the kitchen and when there fainted, her husband helped her up and when walking towards th sofa fainted again.  She states she was told was likley 2/2 to the Tamillfu/illness and has not happened again.  She sometimes feels like she gets a sharp pain with the  palpitations. She reports definitely not currently pregnant but they would like to expand their family but not until her palpitations are settled. She denies any SOB.   Past Medical History:  Diagnosis Date  . Arrhythmia    non sustained VT needs cardiac monitoring in labor  . Bleeding in early pregnancy 02/24/2014  . Endometriosis 2008   lap endometriosis fulgration- 2008  . Family history of anesthesia complication    mother and father have N/V  . Hemorrhoids   . History of blood clots 01/12/2013   blood clot in right arm post renal stone surgery 01/12/13-on blood thinnners (Lovenox)  . Hx of left knee surgery 2007  . Hx of varicella   . Hx of wisdom tooth extraction 2013  . Intermittent palpitations   . Kidney stones 2014   Sx: cystoscopy w/retrograde pyelogram, ureteroscopy and stent placement  . Nonsustained ventricular tachycardia (HCC)   . Personal history of DVT (deep vein thrombosis)   . PONV (postoperative nausea and vomiting)   . Postpartum care following vaginal delivery (6/7) 09/20/2014  . Subchorionic hemorrhage, antepartum   . Tachycardia   . UTI (lower urinary tract infection)     Past Surgical History:  Procedure Laterality Date  . CYSTOSCOPY WITH RETROGRADE PYELOGRAM, URETEROSCOPY AND STENT PLACEMENT Right 01/12/2013   Procedure: CYSTOSCOPY WITH RETROGRADE PYELOGRAM, URETEROSCOPY AND STENT PLACEMENT;  Surgeon: Valetta Fulleravid S Grapey, MD;  Location: [facility];  Service: Urology;  Laterality:  Right;  with stone basket extraction  . DILATION AND CURETTAGE, DIAGNOSTIC / THERAPEUTIC  2008  . KNEE SURGERY Left 2007  . LAPAROSCOPIC ENDOMETRIOSIS FULGURATION  2008  . WISDOM TOOTH EXTRACTION  2013   two lowers and one upper    Current Outpatient Prescriptions  Medication Sig Dispense Refill  . triamcinolone ointment (KENALOG) 0.1 % Apply 1 application topically 2 (two) times daily. 90 g 1  . valACYclovir (VALTREX) 1000 MG tablet Take 1 tablet (1,000 mg total) by mouth 3 (three)  times daily. 21 tablet 0   No current facility-administered medications for this visit.     Allergies:   Pineapple; Codeine; Decongestant [oxymetazoline]; Percocet [oxycodone-acetaminophen]; and Latex   Social History:  The patient  reports that she has never smoked. She has never used smokeless tobacco. She reports that she does not drink alcohol or use drugs.   Family History:  The patient's family history includes Breast cancer (age of onset: 55) in her mother; Diabetes in her maternal grandmother; Gestational diabetes in her mother; Healthy in her sister; Heart attack in her maternal grandfather; Heart disease in her father; Hyperlipidemia in her father and mother; Hypertension in her father and mother; Lung cancer in her other.  ROS:  Please see the history of present illness.  All other systems are reviewed and otherwise negative.   PHYSICAL EXAM:  VS:  LMP 11/07/2016  BMI: There is no height or weight on file to calculate BMI. Well nourished, well developed, in no acute distress  HEENT: normocephalic, atraumatic  Neck: no JVD, carotid bruits or masses Cardiac:  RRR; no significant murmurs, no rubs, or gallops Lungs: CTA b/l, no wheezing, rhonchi or rales  Abd: soft, nontender MS: no deformity or atrophy Ext: no edema  Skin: warm and dry, no rash Neuro:  No gross deficits appreciated Psych: euthymic mood, full affect    EKG:  Done today shows SR, 80bpm, normal EKG  Recent Labs: No results found for requested labs within last 8760 hours.  No results found for requested labs within last 8760 hours.   CrCl cannot be calculated (Patient's most recent lab result is older than the maximum 21 days allowed.).   Wt Readings from Last 3 Encounters:  11/07/16 131 lb 2 oz (59.5 kg)  09/30/16 133 lb (60.3 kg)  01/17/16 132 lb 8 oz (60.1 kg)     Other studies reviewed: Additional studies/records reviewed today include: summarized above  ASSESSMENT AND PLAN:  1. Hx of PVCs      Palpitations persist over the years, more so of late  Discussed with Dr. Elberta Fortis (telephone) no need for repeat monitoring given known PVCs, syncopal event in Feb was reported  to have been 2/2 illness/tamiful at the time, has not fainted again.  Will start low dose Propanolol 10mg  QID and see her back.  Check BMET and mag.  Should she decide to try to get pregnant or find herself pregnant she is instructed to let us know.   Disposition: F/u in clinic in 1 month, sooner if needed.  Current medicines are reviewed at length with the patient today.  The patient did not have any concerns regarding medicines.  Judith Blonder, PA-C 11/18/2016 7:50 PM     [facility] [address] [place] [address] [phone] (office)  [phone] (fax)

## 2016-11-19 ENCOUNTER — Ambulatory Visit (INDEPENDENT_AMBULATORY_CARE_PROVIDER_SITE_OTHER): Payer: PRIVATE HEALTH INSURANCE | Admitting: Physician Assistant

## 2016-11-19 ENCOUNTER — Encounter: Payer: Self-pay | Admitting: Physician Assistant

## 2016-11-19 VITALS — BP 110/78 | HR 81 | Ht 64.5 in | Wt 133.4 lb

## 2016-11-19 DIAGNOSIS — I493 Ventricular premature depolarization: Secondary | ICD-10-CM

## 2016-11-19 DIAGNOSIS — R002 Palpitations: Secondary | ICD-10-CM | POA: Diagnosis not present

## 2016-11-19 LAB — BASIC METABOLIC PANEL
BUN / CREAT RATIO: 15 (ref 9–23)
BUN: 11 mg/dL (ref 6–20)
CO2: 24 mmol/L (ref 20–29)
CREATININE: 0.74 mg/dL (ref 0.57–1.00)
Calcium: 9.8 mg/dL (ref 8.7–10.2)
Chloride: 103 mmol/L (ref 96–106)
GFR, EST AFRICAN AMERICAN: 129 mL/min/{1.73_m2} (ref >59)
GFR, EST NON AFRICAN AMERICAN: 112 mL/min/{1.73_m2} (ref >59)
GLUCOSE: 88 mg/dL (ref 65–99)
Potassium: 4.6 mmol/L (ref 3.5–5.2)
SODIUM: 140 mmol/L (ref 134–144)

## 2016-11-19 LAB — MAGNESIUM: MAGNESIUM: 2.2 mg/dL (ref 1.6–2.3)

## 2016-11-19 MED ORDER — PROPRANOLOL HCL 10 MG PO TABS: 10.0000 mg | ORAL_TABLET | Freq: Four times a day (QID) | ORAL | 5 refills | Status: DC

## 2016-11-19 NOTE — Patient Instructions (Addendum)
Medication Instruction  PROPRANOLOL 10 MG  FOUR TIMES DAY    If you need a refill on your cardiac medications before your next appointment, please call your pharmacy.  Labwork:  BMET AND MAG TODAY   Testing/Procedures: NONE ORDERED  TODAY   Follow-Up: IN 2 WEEKS WITH RENEE URSUY   Any Other Special Instructions Will Be Listed Below (If Applicable).

## 2016-12-04 ENCOUNTER — Encounter: Payer: PRIVATE HEALTH INSURANCE | Admitting: Family Medicine

## 2017-02-03 ENCOUNTER — Telehealth: Payer: Self-pay | Admitting: Family Medicine

## 2017-02-03 MED ORDER — VALACYCLOVIR HCL 1 G PO TABS: 1000.0000 mg | ORAL_TABLET | Freq: Three times a day (TID) | ORAL | 0 refills | Status: DC

## 2017-02-03 NOTE — Telephone Encounter (Signed)
Pt [place] and states that her finger has broke out and is asking if Dr. Beverely Lowabori would call in a abx to [facility] in [place], ph [phone].

## 2017-02-03 NOTE — Telephone Encounter (Signed)
Ok for Valtrex 1000mg  TID x7, #21 no refills

## 2017-02-03 NOTE — Telephone Encounter (Signed)
Please advise 

## 2017-02-03 NOTE — Telephone Encounter (Signed)
Pt informed, and med filled local pharmacy.

## 2017-03-03 ENCOUNTER — Telehealth (HOSPITAL_COMMUNITY): Payer: Self-pay

## 2017-03-03 MED ORDER — VALACYCLOVIR HCL 1 G PO TABS: 1000.0000 mg | ORAL_TABLET | Freq: Three times a day (TID) | ORAL | 3 refills | Status: DC

## 2017-03-03 NOTE — Telephone Encounter (Signed)
Copied from CRM [phone]. Topic: Inquiry >> Mar 03, 2017  9:39 AM Anice PaganiniMunoz, Ladell Bey I, NT wrote: Reason for CRM: pt call for Doctor Tabori Nurse She have Question about there med.Thanks

## 2017-03-03 NOTE — Telephone Encounter (Signed)
Called and advised pt that Rx was sent to pharmacy and it has refills. Pt stated an understanding.

## 2017-03-03 NOTE — Telephone Encounter (Signed)
Ok for #21 w/ 3 refills

## 2017-03-03 NOTE — Telephone Encounter (Signed)
Patient states that the same blister that was on her finger is now flaring up again on her elbow.    She is asking if she can get a refill on the Valtrex.   She is asking if there is any way that she can get a refills on the bottle so that if this flares up again she can get on the medicine sooner.

## 2017-03-03 NOTE — Telephone Encounter (Signed)
Please see what this pt needs- thanks!

## 2017-03-19 ENCOUNTER — Ambulatory Visit: Payer: Self-pay | Admitting: *Deleted

## 2017-03-19 NOTE — Telephone Encounter (Signed)
Pt called back to say she was seen at Urgent care and pt states she was told that they do not have a doppler to see if it is a blood clot. I called [facility] at [place] [facility] and spoke to APharmacist, communityLLTEL CorporationBethany Dillard. Advised bu flow coordinator to tell pt to [facility]. Pt informed and states she will go.

## 2017-03-19 NOTE — Telephone Encounter (Signed)
Due to pt's history of a blood clot in her right arm (same one that is injured now) a few years ago she has decided to go to an urgent care center there in [place] where she is working today.   Unable to see a provider on Friday due to her work schedule.   I instructed her to call us back if that did not work out for her or she got worse.  She verbalized understanding. Reason for Disposition . [1] After 3 days AND [2] pain not improving  Answer Assessment - Initial Assessment Questions 1. MECHANISM: "How did the injury happen?"     I was putting up a curtain rod and fell in the tub.   I grabbed for the curtain rod and it fell onto my wrist.   I can move it fine and there is no bruising or swelling at the site.   It's just sore when I bend my wrist.   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   It seems  To be getting more sore.  I have a knot on my forearm that is tender. 2. ONSET: "When did the injury happen?" (Minutes or hours ago)       3. LOCATION: "Where is the injury located?"      Wrist wrist 4. APPEARANCE of INJURY: "What does the injury look like?"      A knot on the wrist that is tender.  No swelling or warmth, skins tears, etc. 5. SEVERITY: "Can you use the arm normally?"      I can but  It is becoming more sore as I use it.   I can use it normally. 6. SWELLING or BRUISING: "is there any swelling or bruising?" If so, ask: "How large is it? (e.g., inches, centimeters)      Just a knot on the wrist where the rod hit it that is tender. 7. PAIN: "Is there pain?" If so, ask: "How bad is the pain?"    (Scale 1-10; or mild, moderate, severe)     It mainly hurts when I move it. 8. TETANUS: For any breaks in the skin, ask: "When was the last tetanus booster?"     Not asked   No skin breaks 9. OTHER SYMPTOMS: "Do you have any other symptoms?"  (e.g., numbness in hand)     No.   I'm concerned because I had a blood clot in my right arm when I was 26 yrs old that I didn't know  I had.  It was diagnosed when I went to the doctor for a cold and I mentioned my arm was sore.   I was on birth control pills at the time.   I can no longer use birth control pills as a result of the former clot in my right arm. 10. PREGNANCY: "Is there any chance you are pregnant?" "When was your last menstrual period?"       Not asked  Protocols used: ARM INJURY-A-AH

## 2017-03-19 NOTE — Telephone Encounter (Signed)
If pt is concerned about blood clot she has 2 choices- she can go to ER tonight for evaluation or wait until tomorrow for appt here in the office.  But we cannot order a doppler without seeing her as we need to know we're doing a proper evaluation.  The mechanism of injury is more consistent w/ a hematoma- large collection of blood similar to a bruise.  Apply heat or ice (whichever feels better) and schedule an appt if ER is not an option

## 2017-03-19 NOTE — Telephone Encounter (Addendum)
Spoke with patient and she has decided that [facility] since they can do everything there.  She will call if she has any other questions, but she states that she would rather get it checked tonight just to be safe

## 2017-03-19 NOTE — Telephone Encounter (Signed)
Patient did not want to go to ED. She is requesting a Doppler order so that she can just get it checked.     Routed to provider to advise.

## 2017-03-20 ENCOUNTER — Emergency Department (HOSPITAL_BASED_OUTPATIENT_CLINIC_OR_DEPARTMENT_OTHER): Payer: No Typology Code available for payment source

## 2017-03-20 ENCOUNTER — Encounter (HOSPITAL_BASED_OUTPATIENT_CLINIC_OR_DEPARTMENT_OTHER): Payer: Self-pay

## 2017-03-20 ENCOUNTER — Other Ambulatory Visit: Payer: Self-pay

## 2017-03-20 ENCOUNTER — Emergency Department (HOSPITAL_BASED_OUTPATIENT_CLINIC_OR_DEPARTMENT_OTHER)
Admission: EM | Admit: 2017-03-20 | Discharge: 2017-03-20 | Disposition: A | Discharge status: Home / Self Care | Payer: No Typology Code available for payment source | Attending: Emergency Medicine | Admitting: Emergency Medicine

## 2017-03-20 DIAGNOSIS — M79601 Pain in right arm: Secondary | ICD-10-CM | POA: Insufficient documentation

## 2017-03-20 MED ORDER — IBUPROFEN 800 MG PO TABS: 800.0000 mg | ORAL_TABLET | Freq: Three times a day (TID) | ORAL | 0 refills | Status: DC | PRN

## 2017-03-20 NOTE — Discharge Instructions (Signed)
You were seen in the ED today with right arm pain. We have scheduled an ultrasound of the arm for tomorrow morning here at 8AM. Use the Motrin prescribed as needed for pain.   Return to the ED immediately with any chest pain or sudden difficulty breathing.

## 2017-03-20 NOTE — ED Triage Notes (Signed)
C/o pain to right inner UE x 1 week after a fall-NAD-steady gait

## 2017-03-20 NOTE — ED Provider Notes (Signed)
Emergency Department Provider Note   I have reviewed the triage vital signs and the nursing notes.   HISTORY  Chief Complaint Arm Pain   HPI Jessica Diaz is a 26 y.o. female with PMH of DVT in the RUE pain in the right forearm over the last week.  Patient states that it began when she had an injury to the arm.  She was hanging curtains and the rod fell and hit her arm.  She denies any FOOSH injury mechanism.  She has been trying Tylenol and Motrin along with a heating pack with no relief in symptoms.  DVT in the right upper extremity presented in a similar way and was in the same arm.  Denies any fevers, chills, rash.  No swelling in the arm or hand.    Past Medical History:  Diagnosis Date  . Arrhythmia    non sustained VT needs cardiac monitoring in labor  . Bleeding in early pregnancy 02/24/2014  . Endometriosis 2008   lap endometriosis fulgration- 2008  . Family history of anesthesia complication    mother and father have N/V  . Hemorrhoids   . History of blood clots 01/12/2013   blood clot in right arm post renal stone surgery 01/12/13-on blood thinnners (Lovenox)  . Hx of left knee surgery 2007  . Hx of varicella   . Hx of wisdom tooth extraction 2013  . Intermittent palpitations   . Kidney stones 2014   Sx: cystoscopy w/retrograde pyelogram, ureteroscopy and stent placement  . Nonsustained ventricular tachycardia (HCC)   . Personal history of DVT (deep vein thrombosis)   . PONV (postoperative nausea and vomiting)   . Postpartum care following vaginal delivery (6/7) 09/20/2014  . Subchorionic hemorrhage, antepartum   . Tachycardia   . UTI (lower urinary tract infection)     Patient Active Problem List   Diagnosis Date Noted  . Mastitis 10/14/2015  . Calf pain 03/31/2015  . Vacuum extraction, delivered, current hospitalization 09/21/2014  . Postpartum care following vaginal delivery (6/7) 09/20/2014  . 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 Sinusitis, acute maxillary 06/23/2014  . Bleeding in early pregnancy 02/24/2014  . Breast pain, right 07/12/2013  . Superficial phlebitis of arm 01/28/2013  . Wrist pain, right 01/27/2013  . Ureteral calculi 01/12/2013  . Pilonidal cyst 09/25/2012  . Abnormal TSH 11/22/2011  . Spider bite 11/22/2011  . Migraine 11/22/2011  . Sore throat 09/30/2011  . HEMATURIA, HX OF 08/08/2009  . Acute sinusitis 06/28/2009  . ALLERGIC RHINITIS 06/28/2009  . DYSPNEA/SHORTNESS OF BREATH 01/11/2009  . CONTACT DERMATITIS 07/05/2008  . SYNCOPE 04/26/2008  . CARDIAC MURMUR, AORTIC 07/13/2007  . ENDOMETRIOSIS, SITE UNSPECIFIED 06/25/2007  . PALPITATIONS, HX OF 06/25/2007    Past Surgical History:  Procedure Laterality Date  . CYSTOSCOPY WITH RETROGRADE PYELOGRAM, URETEROSCOPY AND STENT PLACEMENT Right 01/12/2013   Procedure: CYSTOSCOPY WITH RETROGRADE PYELOGRAM, URETEROSCOPY AND STENT PLACEMENT;  Surgeon: Valetta Fulleravid S Grapey, MD;  Location: [facility];  Service: Urology;  Laterality: Right;  with stone basket extraction  . DILATION AND CURETTAGE, DIAGNOSTIC / THERAPEUTIC  2008  . KNEE SURGERY Left 2007  . LAPAROSCOPIC ENDOMETRIOSIS FULGURATION  2008  . WISDOM TOOTH EXTRACTION  2013   two lowers and one upper      Allergies Pineapple; Codeine; Decongestant [oxymetazoline]; Percocet [oxycodone-acetaminophen]; and Latex  Family History  Problem Relation Age of Onset  . Heart attack Maternal Grandfather   . Breast cancer Mother [id number]  .  Gestational diabetes Mother   . Hypertension Mother   . Hyperlipidemia Mother   . Heart disease Father        SBE, aorta replacement @ age 27  . Hypertension Father   . Hyperlipidemia Father   . Lung cancer Other        paternal side  . Diabetes Maternal Grandmother   . Healthy Sister        x3    Social History Social History   Tobacco Use  . Smoking status: Never Smoker  . Smokeless tobacco: Never Used  . Tobacco comment: never  used product  Substance Use Topics  . Alcohol use: No    Alcohol/week: 0.0 oz  . Drug use: No    Review of Systems  Constitutional: No fever/chills Eyes: No visual changes. ENT: No sore throat. Cardiovascular: Denies chest pain. Respiratory: Denies shortness of breath. Gastrointestinal: No abdominal pain.  No nausea, no vomiting.  No diarrhea.  No constipation. Genitourinary: Negative for dysuria. Musculoskeletal: Negative for back pain. Positive right forearm pain.  Skin: Negative for rash. Neurological: Negative for headaches, focal weakness or numbness.  10-point ROS otherwise negative.  ____________________________________________   PHYSICAL EXAM:  VITAL SIGNS: ED Triage Vitals  Enc Vitals Group     BP 03/20/17 1623 132/89     Pulse Rate 03/20/17 1623 84     Resp 03/20/17 1623 18     Temp 03/20/17 1623 98.3 F (36.8 C)     Temp Source 03/20/17 1623 Oral     SpO2 03/20/17 1623 100 %     Weight 03/20/17 1623 137 lb (62.1 kg)     Height 03/20/17 1623 5\' 4"  (1.626 m)     Pain Score 03/20/17 1619 5    Constitutional: Alert and oriented. Well appearing and in no acute distress. Eyes: Conjunctivae are normal.  Head: Atraumatic. Nose: No congestion/rhinnorhea. Mouth/Throat: Mucous membranes are moist.  Neck: No stridor.  Cardiovascular: Normal rate, regular rhythm. Good peripheral circulation. Grossly normal heart sounds.   Respiratory: Normal respiratory effort.  No retractions. Lungs CTAB. Gastrointestinal: Soft and nontender. No distention.  Musculoskeletal: No lower extremity tenderness nor edema. No gross deformities of extremities. No right forearm pain/swelling. No rash. No tenderness to palpation over the wrist.  Neurologic:  Normal speech and language. No gross focal neurologic deficits are appreciated.  Skin:  Skin is warm, dry and intact. No rash noted.  ____________________________________________  RADIOLOGY  Dg Forearm Right  Result Date:  03/20/2017 CLINICAL DATA:  Fall 1 week ago with forearm pain, initial encounter EXAM: RIGHT FOREARM - 2 VIEW COMPARISON:  07/19/2013 FINDINGS: There is no evidence of fracture or other focal bone lesions. Soft tissues are unremarkable. IMPRESSION: No acute abnormality noted. Electronically Signed   By: Alcide CleverMark  Lukens M.D.   On: 03/20/2017 17:42    ____________________________________________   PROCEDURES  Procedure(s) performed:   Procedures  None ____________________________________________   INITIAL IMPRESSION / ASSESSMENT AND PLAN / ED COURSE  Pertinent labs & imaging results that were available during my care of the patient were reviewed by me and considered in my medical decision making (see chart for details).  Patient presents the emergency department for evaluation of right arm pain.  She has history of DVT in that same arm and completed a course of anticoagulation.  The pain seems most consistent with musculoskeletal strain.  No tenderness over the wrist or elbow.  Some right forearm tenderness.  Plan for x-ray of the right arm as well  as ultrasound of the right upper extremity to evaluate for DVT given her history.  [place] not available at this time. Have scheduled 8 AM [place] back here in the AM. Plain film pending.   Plain film with no acute findings. Possible forearm muscle/ligament strain. Will not anticoagulate tonight. Discussed return precautions in detail.   At this time, I do not feel there is any life-threatening condition present. I have reviewed and discussed all results (EKG, imaging, lab, urine as appropriate), exam findings with patient. I have reviewed nursing notes and appropriate previous records.  I feel the patient is safe to be discharged home without further emergent workup. Discussed usual and customary return precautions. Patient and family (if present) verbalize understanding and are comfortable with this plan.  Patient will follow-up with their primary care  provider. If they do not have a primary care provider, information for follow-up has been provided to them. All questions have been answered.  ____________________________________________  FINAL CLINICAL IMPRESSION(S) / ED DIAGNOSES  Final diagnoses:  Right arm pain     MEDICATIONS GIVEN DURING THIS VISIT:  None  NEW OUTPATIENT MEDICATIONS STARTED DURING THIS VISIT:  Motrin 800 mg   Note:  This document was prepared using Dragon voice recognition software and may include unintentional dictation errors.  Alona Bene, MD Emergency Medicine    Long, Arlyss Repress, MD 03/20/17 [phone]

## 2017-03-21 ENCOUNTER — Ambulatory Visit (HOSPITAL_BASED_OUTPATIENT_CLINIC_OR_DEPARTMENT_OTHER)
Admit: 2017-03-21 | Discharge: 2017-03-21 | Disposition: A | Discharge status: Home / Self Care | Payer: PRIVATE HEALTH INSURANCE | Attending: Emergency Medicine | Admitting: Emergency Medicine

## 2017-03-21 ENCOUNTER — Encounter (HOSPITAL_BASED_OUTPATIENT_CLINIC_OR_DEPARTMENT_OTHER): Payer: Self-pay

## 2017-03-22 ENCOUNTER — Ambulatory Visit (HOSPITAL_BASED_OUTPATIENT_CLINIC_OR_DEPARTMENT_OTHER)
Admission: RE | Admit: 2017-03-22 | Discharge: 2017-03-22 | Disposition: A | Discharge status: Home / Self Care | Payer: No Typology Code available for payment source | Source: Ambulatory Visit | Attending: Emergency Medicine | Admitting: Emergency Medicine

## 2017-03-22 DIAGNOSIS — Z86718 Personal history of other venous thrombosis and embolism: Secondary | ICD-10-CM | POA: Diagnosis present

## 2017-09-02 ENCOUNTER — Encounter: Payer: Self-pay | Admitting: Hematology & Oncology

## 2017-09-03 ENCOUNTER — Other Ambulatory Visit: Payer: Self-pay | Admitting: *Deleted

## 2017-09-04 ENCOUNTER — Inpatient Hospital Stay
Payer: No Typology Code available for payment source | Attending: Hematology & Oncology | Admitting: Hematology & Oncology

## 2017-09-04 ENCOUNTER — Other Ambulatory Visit: Payer: Self-pay

## 2017-09-04 ENCOUNTER — Inpatient Hospital Stay: Payer: No Typology Code available for payment source

## 2017-09-04 ENCOUNTER — Encounter: Payer: Self-pay | Admitting: Hematology & Oncology

## 2017-09-04 VITALS — BP 120/70 | HR 71 | Temp 98.1°F | Resp 16 | Wt 140.0 lb

## 2017-09-04 DIAGNOSIS — Z79899 Other long term (current) drug therapy: Secondary | ICD-10-CM | POA: Diagnosis not present

## 2017-09-04 DIAGNOSIS — O99113 Other diseases of the blood and blood-forming organs and certain disorders involving the immune mechanism complicating pregnancy, third trimester: Principal | ICD-10-CM

## 2017-09-04 DIAGNOSIS — Z86718 Personal history of other venous thrombosis and embolism: Secondary | ICD-10-CM | POA: Diagnosis present

## 2017-09-04 DIAGNOSIS — D6859 Other primary thrombophilia: Secondary | ICD-10-CM

## 2017-09-04 DIAGNOSIS — N809 Endometriosis, unspecified: Secondary | ICD-10-CM | POA: Diagnosis not present

## 2017-09-04 LAB — CMP (CANCER CENTER ONLY)
ALBUMIN: 4.1 g/dL (ref 3.5–5.0)
ALT: 12 U/L (ref 0–55)
ANION GAP: 8 (ref 3–11)
AST: 14 U/L (ref 5–34)
Alkaline Phosphatase: 55 U/L (ref 40–150)
BILIRUBIN TOTAL: 0.4 mg/dL (ref 0.2–1.2)
BUN: 17 mg/dL (ref 7–26)
CALCIUM: 9.8 mg/dL (ref 8.4–10.4)
CO2: 28 mmol/L (ref 22–29)
Chloride: 104 mmol/L (ref 98–109)
Creatinine: 0.82 mg/dL (ref 0.60–1.10)
GFR, Est AFR Am: >60 mL/min (ref >60)
GFR, Estimated: >60 mL/min (ref >60)
GLUCOSE: 89 mg/dL (ref 70–140)
POTASSIUM: 4.8 mmol/L (ref 3.5–5.1)
Sodium: 140 mmol/L (ref 136–145)
Total Protein: 7.6 g/dL (ref 6.4–8.3)

## 2017-09-04 LAB — CBC WITH DIFFERENTIAL (CANCER CENTER ONLY)
BASOS PCT: 1 %
Basophils Absolute: 0 10*3/uL (ref 0.0–0.1)
EOS PCT: 3 %
Eosinophils Absolute: 0.1 10*3/uL (ref 0.0–0.5)
HCT: 39.8 % (ref 34.8–46.6)
Hemoglobin: 13.6 g/dL (ref 11.6–15.9)
LYMPHS ABS: 1.8 10*3/uL (ref 0.9–3.3)
Lymphocytes Relative: 39 %
MCH: 30.1 pg (ref 26.0–34.0)
MCHC: 34.2 g/dL (ref 32.0–36.0)
MCV: 88.1 fL (ref 81.0–101.0)
MONOS PCT: 10 %
Monocytes Absolute: 0.5 10*3/uL (ref 0.1–0.9)
NEUTROS PCT: 47 %
Neutro Abs: 2.2 10*3/uL (ref 1.5–6.5)
PLATELETS: 237 10*3/uL (ref 145–400)
RBC: 4.52 MIL/uL (ref 3.70–5.32)
RDW: 12.9 % (ref 11.1–15.7)
WBC Count: 4.7 10*3/uL (ref 3.9–10.0)

## 2017-09-04 LAB — LACTATE DEHYDROGENASE: LDH: 153 U/L (ref 125–245)

## 2017-09-05 NOTE — Progress Notes (Signed)
Hematology and Oncology Follow Up Visit  Jessica Diaz [id number] 07-21-1990 27 y.o. 09/05/2017   Principle Diagnosis:   History of superficial thrombus of the right forearm   Endometriosis  Current Therapy:    Observation     Interim History:  Jessica Diaz is back for a long awaited visit.  She comes back because she needs to have laparoscopic surgery for endometriosis.  Because of her history of the superficial thrombus in her right forearm, her gynecologist, Dr. Billy Coast, wanted to make sure that she did not need any type of anticoagulation.  It is been about 3 years since we saw Jessica Diaz.  She had her baby.  We had her on Lovenox for her pregnancy.  The surgery is going to be laparoscopic.  It will be outpatient, or possibly just an overnight stay.  She is doing well.  She has had no problems.  She has had no issues with blood clots.  She is had no cough or shortness of breath.  She has had no change in bowel or bladder habits.  She has had no rashes.  She is really had no new medications given.  Her appetite is good.  Her weight has been holding pretty stable.  Overall, her performance status is ECOG 0.  Medications:  Current Outpatient Medications:  .  cefdinir (OMNICEF) 300 MG capsule, Take 300 mg by mouth 2 (two) times daily., Disp: , Rfl: 0 .  ibuprofen (ADVIL,MOTRIN) 800 MG tablet, Take 1 tablet (800 mg total) by mouth every 8 (eight) hours as needed., Disp: 21 tablet, Rfl: 0  Allergies:  Allergies  Allergen Reactions  . Pineapple Anaphylaxis  . Codeine Other (See Comments)    Pt states this medication causes her bp to rise.    Leilani Merl [Oxymetazoline] Other (See Comments)    Pt states this medication causes her bp to drop.    Marland Kitchen Percocet [Oxycodone-Acetaminophen] Other (See Comments)    Pt states this medication causes her to hallucinate.    . Latex Rash    Past Medical History, Surgical history, Social history, and Family History were reviewed and  updated.  Review of Systems: Review of Systems  Constitutional: Negative.   HENT:  Negative.   Eyes: Negative.   Respiratory: Negative.   Cardiovascular: Negative.   Gastrointestinal: Negative.   Endocrine: Negative.   Genitourinary: Positive for pelvic pain.   Musculoskeletal: Negative.   Skin: Negative.   Neurological: Negative.   Hematological: Negative.   Psychiatric/Behavioral: Negative.     Physical Exam:  weight is 140 lb (63.5 kg). Her oral temperature is 98.1 F (36.7 C). Her blood pressure is 120/70 and her pulse is 71. Her respiration is 16 and oxygen saturation is 100%.   Wt Readings from Last 3 Encounters:  09/04/17 140 lb (63.5 kg)  03/20/17 137 lb (62.1 kg)  11/19/16 133 lb 6.4 oz (60.5 kg)    Physical Exam  Constitutional: She is oriented to person, place, and time.  HENT:  Head: Normocephalic and atraumatic.  Mouth/Throat: Oropharynx is clear and moist.  Eyes: Pupils are equal, round, and reactive to light. EOM are normal.  Neck: Normal range of motion.  Cardiovascular: Normal rate, regular rhythm and normal heart sounds.  Pulmonary/Chest: Effort normal and breath sounds normal.  Abdominal: Soft. Bowel sounds are normal.  Musculoskeletal: Normal range of motion. She exhibits no edema, tenderness or deformity.  Lymphadenopathy:    She has no cervical adenopathy.  Neurological: She is alert and oriented  to person, place, and time.  Skin: Skin is warm and dry. No rash noted. No erythema.  Psychiatric: She has a normal mood and affect. Her behavior is normal. Judgment and thought content normal.  Vitals reviewed.    Lab Results  Component Value Date   WBC 4.7 09/04/2017   HGB 13.6 09/04/2017   HCT 39.8 09/04/2017   MCV 88.1 09/04/2017   PLT 237 09/04/2017     Chemistry      Component Value Date/Time   NA 140 09/04/2017 0831   NA 140 11/19/2016 1022   NA 142 04/07/2015 1506   K 4.8 09/04/2017 0831   K 4.2 04/07/2015 1506   CL 104 09/04/2017  0831   CL 103 04/07/2015 1506   CO2 28 09/04/2017 0831   CO2 26 04/07/2015 1506   BUN 17 09/04/2017 0831   BUN 11 11/19/2016 1022   BUN 14 04/07/2015 1506   CREATININE 0.82 09/04/2017 0831   CREATININE 0.8 04/07/2015 1506      Component Value Date/Time   CALCIUM 9.8 09/04/2017 0831   CALCIUM 8.9 04/07/2015 1506   ALKPHOS 55 09/04/2017 0831   ALKPHOS 83 04/07/2015 1506   AST 14 09/04/2017 0831   ALT 12 09/04/2017 0831   ALT 17 04/07/2015 1506   BILITOT 0.4 09/04/2017 0831         Impression and Plan: Jessica Diaz is a 27 year old white female.  She has a past history of a superficial thrombus in the right forearm.  This is idiopathic.  There is no evidence of any thrombophilic condition that we have found.  I do not see that she will need any type of anticoagulation in the perioperative period.  This procedure is going to be done laparoscopically.  I would think that she should be out of bed and walking that day.  I just do not see that she needs to be on any type of prophylactic anticoagulation for the procedure.  As always, is nice to see Jessica Diaz.  She is a lot of fun to talk to.  She is certainly quite proactive.  Of note, she would like to have another child.  I am not sure if this is gone no get by her husband.  If he actually agrees to have another child, she will need anticoagulation during her pregnancy.  I told her to come back and see [place] or call us if she does have any issues in the future (i.e. if she becomes pregnant).  I just do not think that we are going to run into any problems with her having this laparoscopic procedure for endometriosis.   Josph Macho, MD 5/24/20197:12 AM

## 2017-09-29 ENCOUNTER — Other Ambulatory Visit: Payer: Self-pay | Admitting: Obstetrics and Gynecology

## 2017-10-10 ENCOUNTER — Encounter (HOSPITAL_COMMUNITY): Payer: Self-pay

## 2017-10-10 NOTE — Pre-Procedure Instructions (Signed)
The following are in epic: Last office visit note Dr. Myna HidalgoEnnever 09/04/2017 EKG 11/19/2016

## 2017-10-10 NOTE — Patient Instructions (Signed)
Your procedure is scheduled on: Tuesday, October 21, 2017   Surgery Time:  1:00PM-2:42PM   Report to [facility] Main  Entrance    Report to admitting at 11:00 AM   Call this number if you have problems the morning of surgery [phone]   Do not eat food:After Midnight.   Do NOT smoke after Midnight   May have liquids until 7:00AM day of surgery      CLEAR LIQUID DIET   Foods Allowed                                                                     Foods Excluded  Water, Coffee and tea, regular and decaf                             liquids that you cannot  Plain Jell-O in any flavor                                             see through such as: Fruit ices (not with fruit pulp)                                     milk, soups, orange juice  Iced Popsicles                                    All solid food Carbonated beverages, regular and diet                                    Cranberry, grape and apple juices Sports drinks like Gatorade Lightly seasoned clear broth or consume(fat free) Sugar, honey syrup  Sample Menu Breakfast                                Lunch                                     Supper Cranberry juice                    Beef broth                            Chicken broth Jell-O                                     Grape juice                           Apple juice Coffee or tea  Jell-O                                      Popsicle                                                Coffee or tea                        Coffee or tea   Take these medicines the morning of surgery with A SIP OF WATER: Tylenol if needed                               You may not have any metal on your body including hair pins, jewelry, and body piercings             Do not wear make-up, lotions, powders, perfumes/cologne, or deodorant             Do not wear nail polish.  Do not shave  48 hours prior to surgery.                Do not bring  valuables to the hospital. [facility] IS NOT             RESPONSIBLE   FOR VALUABLES.   Contacts, dentures or bridgework may not be worn into surgery.    Patients discharged the day of surgery will not be allowed to drive home.   Special Instructions: Bring a copy of your healthcare power of attorney and living will documents         the day of surgery if you haven't scanned them in before.              Please read over the following fact sheets you were given: [facility] - Preparing for Surgery Before surgery, you can play an important role.  Because skin is not sterile, your skin needs to be as free of germs as possible.  You can reduce the number of germs on your skin by washing with CHG (chlorahexidine gluconate) soap before surgery.  CHG is an antiseptic cleaner which kills germs and bonds with the skin to continue killing germs even after washing. Please DO NOT use if you have an allergy to CHG or antibacterial soaps.  If your skin becomes reddened/irritated stop using the CHG and inform your nurse when you arrive at Short Stay. Do not shave (including legs and underarms) for at least 48 hours prior to the first CHG shower.  You may shave your face/neck.  Please follow these instructions carefully:  1.  Shower with CHG Soap the night before surgery and the  morning of surgery.  2.  If you choose to wash your hair, wash your hair first as usual with your normal  shampoo.  3.  After you shampoo, rinse your hair and body thoroughly to remove the shampoo.                             4.  Use CHG as you would any other liquid soap.  You can apply chg directly to the skin and wash.  Gently with  a scrungie or clean washcloth.  5.  Apply the CHG Soap to your body ONLY FROM THE NECK DOWN.   Do   not use on face/ open                           Wound or open sores. Avoid contact with eyes, ears mouth and   genitals (private parts).                       Wash face,  Genitals (private parts) with  your normal soap.             6.  Wash thoroughly, paying special attention to the area where your    surgery  will be performed.  7.  Thoroughly rinse your body with warm water from the neck down.  8.  DO NOT shower/wash with your normal soap after using and rinsing off the CHG Soap.                9.  Pat yourself dry with a clean towel.            10.  Wear clean pajamas.            11.  Place clean sheets on your bed the night of your first shower and do not  sleep with pets. Day of Surgery : Do not apply any lotions/deodorants the morning of surgery.  Please wear clean clothes to the hospital/surgery center.  FAILURE TO FOLLOW THESE INSTRUCTIONS MAY RESULT IN THE CANCELLATION OF YOUR SURGERY  PATIENT SIGNATURE_________________________________  NURSE SIGNATURE__________________________________  ________________________________________________________________________  WHAT IS A BLOOD TRANSFUSION? Blood Transfusion Information  A transfusion is the replacement of blood or some of its parts. Blood is made up of multiple cells which provide different functions.  Red blood cells carry oxygen and are used for blood loss replacement.  White blood cells fight against infection.  Platelets control bleeding.  Plasma helps clot blood.  Other blood products are available for specialized needs, such as hemophilia or other clotting disorders. BEFORE THE TRANSFUSION  Who gives blood for transfusions?   Healthy volunteers who are fully evaluated to make sure their blood is safe. This is blood bank blood. Transfusion therapy is the safest it has ever been in the practice of medicine. Before blood is taken from a donor, a complete history is taken to make sure that person has no history of diseases nor engages in risky social behavior (examples are intravenous drug use or sexual activity with multiple partners). The donor's travel history is screened to minimize risk of transmitting infections,  such as malaria. The donated blood is tested for signs of infectious diseases, such as HIV and hepatitis. The blood is then tested to be sure it is compatible with you in order to minimize the chance of a transfusion reaction. If you or a relative donates blood, this is often done in anticipation of surgery and is not appropriate for emergency situations. It takes many days to process the donated blood. RISKS AND COMPLICATIONS Although transfusion therapy is very safe and saves many lives, the main dangers of transfusion include:   Getting an infectious disease.  Developing a transfusion reaction. This is an allergic reaction to something in the blood you were given. Every precaution is taken to prevent this. The decision to have a blood transfusion has been considered carefully by your caregiver before blood is given. Blood is  not given unless the benefits outweigh the risks. AFTER THE TRANSFUSION  Right after receiving a blood transfusion, you will usually feel much better and more energetic. This is especially true if your red blood cells have gotten low (anemic). The transfusion raises the level of the red blood cells which carry oxygen, and this usually causes an energy increase.  The nurse administering the transfusion will monitor you carefully for complications. HOME CARE INSTRUCTIONS  No special instructions are needed after a transfusion. You may find your energy is better. Speak with your caregiver about any limitations on activity for underlying diseases you may have. SEEK MEDICAL CARE IF:   Your condition is not improving after your transfusion.  You develop redness or irritation at the intravenous (IV) site. SEEK IMMEDIATE MEDICAL CARE IF:  Any of the following symptoms occur over the next 12 hours:  Shaking chills.  You have a temperature by mouth above 102 F (38.9 C), not controlled by medicine.  Chest, back, or muscle pain.  People around you feel you are not acting  correctly or are confused.  Shortness of breath or difficulty breathing.  Dizziness and fainting.  You get a rash or develop hives.  You have a decrease in urine output.  Your urine turns a dark color or changes to pink, red, or brown. Any of the following symptoms occur over the next 10 days:  You have a temperature by mouth above 102 F (38.9 C), not controlled by medicine.  Shortness of breath.  Weakness after normal activity.  The white part of the eye turns yellow (jaundice).  You have a decrease in the amount of urine or are urinating less often.  Your urine turns a dark color or changes to pink, red, or brown. Document Released: 03/29/2000 Document Revised: 06/24/2011 Document Reviewed: 11/16/2007 [facility] Patient Information 2014 [employer], [place].  _______________________________________________________________________

## 2017-10-13 ENCOUNTER — Encounter (HOSPITAL_COMMUNITY): Payer: Self-pay

## 2017-10-13 ENCOUNTER — Other Ambulatory Visit: Payer: Self-pay

## 2017-10-13 ENCOUNTER — Encounter (HOSPITAL_COMMUNITY)
Admission: RE | Admit: 2017-10-13 | Discharge: 2017-10-13 | Disposition: A | Discharge status: Home / Self Care | Payer: No Typology Code available for payment source | Source: Ambulatory Visit | Attending: Obstetrics and Gynecology | Admitting: Obstetrics and Gynecology

## 2017-10-13 DIAGNOSIS — Z01812 Encounter for preprocedural laboratory examination: Secondary | ICD-10-CM | POA: Diagnosis present

## 2017-10-13 HISTORY — DX: Unspecified ovarian cyst, unspecified side: N83.209

## 2017-10-13 HISTORY — DX: Syncope and collapse: R55

## 2017-10-13 HISTORY — DX: Personal history of diseases of the skin and subcutaneous tissue: Z87.2

## 2017-10-13 HISTORY — DX: Migraine, unspecified, not intractable, without status migrainosus: G43.909

## 2017-10-13 HISTORY — DX: Personal history of urinary calculi: Z87.442

## 2017-10-13 HISTORY — DX: Personal history of other infectious and parasitic diseases: Z86.19

## 2017-10-13 LAB — CBC
HCT: 40 % (ref 36.0–46.0)
Hemoglobin: 13.6 g/dL (ref 12.0–15.0)
MCH: 29.6 pg (ref 26.0–34.0)
MCHC: 34 g/dL (ref 30.0–36.0)
MCV: 87.1 fL (ref 78.0–100.0)
PLATELETS: 222 10*3/uL (ref 150–400)
RBC: 4.59 MIL/uL (ref 3.87–5.11)
RDW: 13.3 % (ref 11.5–15.5)
WBC: 5.1 10*3/uL (ref 4.0–10.5)

## 2017-10-13 LAB — BASIC METABOLIC PANEL
ANION GAP: 7 (ref 5–15)
BUN: 13 mg/dL (ref 6–20)
CALCIUM: 9.4 mg/dL (ref 8.9–10.3)
CHLORIDE: 110 mmol/L (ref 98–111)
CO2: 25 mmol/L (ref 22–32)
CREATININE: 0.76 mg/dL (ref 0.44–1.00)
GFR calc non Af Amer: >60 mL/min (ref >60)
Glucose, Bld: 95 mg/dL (ref 70–99)
Potassium: 4.7 mmol/L (ref 3.5–5.1)
SODIUM: 142 mmol/L (ref 135–145)

## 2017-10-13 LAB — ABO/RH: ABO/RH(D): A POS

## 2017-10-21 ENCOUNTER — Encounter (HOSPITAL_COMMUNITY): Payer: Self-pay | Admitting: Anesthesiology

## 2017-10-21 ENCOUNTER — Encounter (HOSPITAL_COMMUNITY)
Admission: RE | Disposition: A | Discharge status: Home / Self Care | Payer: Self-pay | Source: Ambulatory Visit | Attending: Obstetrics and Gynecology

## 2017-10-21 ENCOUNTER — Ambulatory Visit (HOSPITAL_COMMUNITY)
Admission: RE | Admit: 2017-10-21 | Discharge: 2017-10-21 | Disposition: A | Discharge status: Home / Self Care | Payer: No Typology Code available for payment source | Source: Ambulatory Visit | Attending: Obstetrics and Gynecology | Admitting: Obstetrics and Gynecology

## 2017-10-21 ENCOUNTER — Encounter (HOSPITAL_COMMUNITY): Payer: Self-pay | Admitting: *Deleted

## 2017-10-21 DIAGNOSIS — Z91040 Latex allergy status: Secondary | ICD-10-CM | POA: Insufficient documentation

## 2017-10-21 DIAGNOSIS — Z539 Procedure and treatment not carried out, unspecified reason: Secondary | ICD-10-CM | POA: Insufficient documentation

## 2017-10-21 DIAGNOSIS — N946 Dysmenorrhea, unspecified: Secondary | ICD-10-CM | POA: Insufficient documentation

## 2017-10-21 DIAGNOSIS — N941 Unspecified dyspareunia: Secondary | ICD-10-CM | POA: Insufficient documentation

## 2017-10-21 DIAGNOSIS — Z86718 Personal history of other venous thrombosis and embolism: Secondary | ICD-10-CM | POA: Diagnosis not present

## 2017-10-21 DIAGNOSIS — Z888 Allergy status to other drugs, medicaments and biological substances status: Secondary | ICD-10-CM | POA: Insufficient documentation

## 2017-10-21 DIAGNOSIS — Z885 Allergy status to narcotic agent status: Secondary | ICD-10-CM | POA: Diagnosis not present

## 2017-10-21 LAB — TYPE AND SCREEN
ABO/RH(D): A POS
Antibody Screen: NEGATIVE

## 2017-10-21 LAB — PREGNANCY, URINE: PREG TEST UR: NEGATIVE

## 2017-10-21 SURGERY — LYSIS, ADHESIONS, ROBOT-ASSISTED, LAPAROSCOPIC
Anesthesia: General

## 2017-10-21 MED ORDER — CEFAZOLIN SODIUM-DEXTROSE 2-4 GM/100ML-% IV SOLN
2.0000 g | INTRAVENOUS | Status: DC
  Filled 2017-10-21: qty 100

## 2017-10-21 MED ORDER — LIDOCAINE 2% (20 MG/ML) 5 ML SYRINGE
INTRAMUSCULAR | Status: AC
  Filled 2017-10-21: qty 5

## 2017-10-21 MED ORDER — FENTANYL CITRATE (PF) 250 MCG/5ML IJ SOLN
INTRAMUSCULAR | Status: AC
  Filled 2017-10-21: qty 5

## 2017-10-21 MED ORDER — ONDANSETRON HCL 4 MG/2ML IJ SOLN
INTRAMUSCULAR | Status: AC
  Filled 2017-10-21: qty 2

## 2017-10-21 MED ORDER — MIDAZOLAM HCL 2 MG/2ML IJ SOLN
INTRAMUSCULAR | Status: AC
  Filled 2017-10-21: qty 2

## 2017-10-21 MED ORDER — LACTATED RINGERS IV SOLN
INTRAVENOUS | Status: DC
  Administered 2017-10-21: 14:00:00 via INTRAVENOUS

## 2017-10-21 MED ORDER — DEXAMETHASONE SODIUM PHOSPHATE 10 MG/ML IJ SOLN
INTRAMUSCULAR | Status: AC
  Filled 2017-10-21: qty 1

## 2017-10-21 MED ORDER — PROPOFOL 10 MG/ML IV BOLUS
INTRAVENOUS | Status: AC
  Filled 2017-10-21: qty 20

## 2017-10-21 NOTE — H&P (Signed)
Jessica Diaz is an 27 y.o. female. Dysmenorrhea and dyspareunia for surgical therapy  Pertinent Gynecological History: Menses: flow is moderate Bleeding: nl menses Contraception: none DES exposure: denies Blood transfusions: none Sexually transmitted diseases: no past history Previous GYN Procedures: DNC  Last mammogram: na Date: na Last pap: normal Date: 2019 OB History: G1, P1   Menstrual History: Menarche age: [age] Patient's last menstrual period was 09/22/2017 (approximate).    Past Medical History:  Diagnosis Date  . Arrhythmia    non sustained VT needs cardiac monitoring in labor  . Bleeding in early pregnancy 02/24/2014  . Endometriosis 2008   lap endometriosis fulgration- 2008  . Family history of anesthesia complication    mother and father have N/V  . Hemorrhoids   . History of herpetic [place] 09/2016  . History of kidney stones 2014   Sx: cystoscopy w/retrograde pyelogram, ureteroscopy and stent placement  . History of pilonidal cyst   . Hx of left knee surgery 2007  . Hx of varicella   . Hx of wisdom tooth extraction 2013  . Intermittent palpitations   . Migraine   . Nonsustained ventricular tachycardia (HCC)   . Ovarian cyst    Left  . Personal history of DVT (deep vein thrombosis) 2014   blood clot in right arm post renal stone surgery 01/12/13-on blood thinnners (Lovenox)  . PONV (postoperative nausea and vomiting)    did not have N/V after cysto  . Postpartum care following vaginal delivery (6/7) 09/20/2014  . Subchorionic hemorrhage, antepartum 2016  . Syncope 2013  . UTI (lower urinary tract infection)     Past Surgical History:  Procedure Laterality Date  . CYSTOSCOPY WITH RETROGRADE PYELOGRAM, URETEROSCOPY AND STENT PLACEMENT Right 01/12/2013   Procedure: CYSTOSCOPY WITH RETROGRADE PYELOGRAM, URETEROSCOPY AND STENT PLACEMENT;  Surgeon: Valetta Fulleravid S Grapey, MD;  Location: [facility];  Service: Urology;  Laterality: Right;  with stone basket extraction  .  KNEE SURGERY Left 2007  . LAPAROSCOPIC ENDOMETRIOSIS FULGURATION  2008  . WISDOM TOOTH EXTRACTION  2013   two lowers and one upper    Family History  Problem Relation Age of Onset  . Heart attack Maternal Grandfather   . Breast cancer Mother [id number]  . Gestational diabetes Mother   . Hypertension Mother   . Hyperlipidemia Mother   . Heart disease Father        SBE, aorta replacement @ age 28  . Hypertension Father   . Hyperlipidemia Father   . Lung cancer Other        paternal side  . Diabetes Maternal Grandmother   . Healthy Sister        x3    Social History:  reports that she has never smoked. She has never used smokeless tobacco. She reports that she does not drink alcohol or use drugs.  Allergies:  Allergies  Allergen Reactions  . Pineapple Anaphylaxis  . Codeine Other (See Comments)    Pt states this medication causes her bp to rise.    [name] Other (See Comments)    Pt states this medication causes her bp to drop.    Marland Kitchen. Percocet [Oxycodone-Acetaminophen] Other (See Comments)    Pt states this medication causes her to hallucinate.    . Latex Rash    No medications prior to admission.    Review of Systems  Constitutional: Negative.   All other systems reviewed and are negative.   Last menstrual period 09/22/2017. Physical Exam  Nursing note  and vitals reviewed. Constitutional: She is oriented to person, place, and time. She appears well-developed and well-nourished.  HENT:  Head: Normocephalic and atraumatic.  Neck: Normal range of motion. Neck supple.  Cardiovascular: Normal rate and regular rhythm.  Respiratory: Effort normal and breath sounds normal.  GI: Soft. Bowel sounds are normal.  Genitourinary: Vagina normal and uterus normal.  Musculoskeletal: Normal range of motion.  Neurological: She is alert and oriented to person, place, and time. She has normal reflexes.  Skin: Skin is warm and dry.  Psychiatric: She has a normal  mood and affect.    No results found for this or any previous visit (from the past 24 hour(s)).  No results found.  Assessment/Plan: Dysmenorrhea Dyspareunia History of Endometriosis Davinci assisted LS excision and ablation of endometriosis Risks of infection, bleeding, possible injury to surrounding discussed.. Inability to cure pain discussed. Consent done.  Crisanto Nied J 10/21/2017, 6:45 AM

## 2017-10-23 ENCOUNTER — Encounter (HOSPITAL_COMMUNITY): Payer: Self-pay | Admitting: *Deleted

## 2017-10-23 ENCOUNTER — Other Ambulatory Visit: Payer: Self-pay

## 2017-10-24 ENCOUNTER — Ambulatory Visit (HOSPITAL_COMMUNITY): Payer: No Typology Code available for payment source | Admitting: Anesthesiology

## 2017-10-24 ENCOUNTER — Encounter (HOSPITAL_COMMUNITY)
Admission: RE | Disposition: A | Discharge status: Home / Self Care | Payer: Self-pay | Source: Ambulatory Visit | Attending: Obstetrics and Gynecology

## 2017-10-24 ENCOUNTER — Encounter (HOSPITAL_COMMUNITY): Payer: Self-pay | Admitting: Anesthesiology

## 2017-10-24 ENCOUNTER — Ambulatory Visit (HOSPITAL_COMMUNITY)
Admission: RE | Admit: 2017-10-24 | Discharge: 2017-10-24 | Disposition: A | Discharge status: Home / Self Care | Payer: No Typology Code available for payment source | Source: Ambulatory Visit | Attending: Obstetrics and Gynecology | Admitting: Obstetrics and Gynecology

## 2017-10-24 DIAGNOSIS — N803 Endometriosis of pelvic peritoneum: Secondary | ICD-10-CM | POA: Diagnosis not present

## 2017-10-24 DIAGNOSIS — N946 Dysmenorrhea, unspecified: Secondary | ICD-10-CM | POA: Diagnosis present

## 2017-10-24 DIAGNOSIS — Z86718 Personal history of other venous thrombosis and embolism: Secondary | ICD-10-CM | POA: Insufficient documentation

## 2017-10-24 DIAGNOSIS — N941 Unspecified dyspareunia: Secondary | ICD-10-CM | POA: Diagnosis not present

## 2017-10-24 HISTORY — PX: ABLATION ON ENDOMETRIOSIS: SHX5787

## 2017-10-24 HISTORY — PX: LAPAROSCOPY: SHX197

## 2017-10-24 SURGERY — LAPAROSCOPY, DIAGNOSTIC
Anesthesia: General

## 2017-10-24 MED ORDER — MEPERIDINE HCL 50 MG/ML IJ SOLN: 6.2500 mg | INTRAMUSCULAR | Status: DC | PRN

## 2017-10-24 MED ORDER — HYDROCODONE-ACETAMINOPHEN 5-325 MG PO TABS: 1.0000 | ORAL_TABLET | Freq: Four times a day (QID) | ORAL | 0 refills | Status: DC | PRN

## 2017-10-24 MED ORDER — SCOPOLAMINE 1 MG/3DAYS TD PT72
MEDICATED_PATCH | TRANSDERMAL | Status: AC
  Filled 2017-10-24: qty 1

## 2017-10-24 MED ORDER — DEXAMETHASONE SODIUM PHOSPHATE 4 MG/ML IJ SOLN
INTRAMUSCULAR | Status: DC | PRN
  Administered 2017-10-24: 10 mg via INTRAVENOUS

## 2017-10-24 MED ORDER — ONDANSETRON HCL 4 MG/2ML IJ SOLN
INTRAMUSCULAR | Status: DC | PRN
  Administered 2017-10-24 (×2): 4 mg via INTRAVENOUS

## 2017-10-24 MED ORDER — FENTANYL CITRATE (PF) 100 MCG/2ML IJ SOLN
INTRAMUSCULAR | Status: AC
  Filled 2017-10-24: qty 2

## 2017-10-24 MED ORDER — SUGAMMADEX SODIUM 200 MG/2ML IV SOLN
INTRAVENOUS | Status: DC | PRN
  Administered 2017-10-24: 200 mg via INTRAVENOUS

## 2017-10-24 MED ORDER — LIDOCAINE 2% (20 MG/ML) 5 ML SYRINGE
INTRAMUSCULAR | Status: AC
  Filled 2017-10-24: qty 5

## 2017-10-24 MED ORDER — ROPIVACAINE HCL 5 MG/ML IJ SOLN
INTRAMUSCULAR | Status: AC
  Filled 2017-10-24: qty 30

## 2017-10-24 MED ORDER — PROPOFOL 10 MG/ML IV BOLUS
INTRAVENOUS | Status: DC | PRN
  Administered 2017-10-24: 100 mg via INTRAVENOUS
  Administered 2017-10-24: 200 mg via INTRAVENOUS
  Administered 2017-10-24: 100 mg via INTRAVENOUS

## 2017-10-24 MED ORDER — SCOPOLAMINE 1 MG/3DAYS TD PT72
1.0000 | MEDICATED_PATCH | TRANSDERMAL | Status: DC
  Administered 2017-10-24: 1.5 mg via TRANSDERMAL

## 2017-10-24 MED ORDER — MIDAZOLAM HCL 5 MG/5ML IJ SOLN
INTRAMUSCULAR | Status: DC | PRN
  Administered 2017-10-24: 2 mg via INTRAVENOUS

## 2017-10-24 MED ORDER — KETOROLAC TROMETHAMINE 30 MG/ML IJ SOLN
INTRAMUSCULAR | Status: DC | PRN
  Administered 2017-10-24: 30 mg via INTRAVENOUS

## 2017-10-24 MED ORDER — SUGAMMADEX SODIUM 200 MG/2ML IV SOLN
INTRAVENOUS | Status: AC
  Filled 2017-10-24: qty 2

## 2017-10-24 MED ORDER — ROCURONIUM BROMIDE 100 MG/10ML IV SOLN
INTRAVENOUS | Status: AC
  Filled 2017-10-24: qty 1

## 2017-10-24 MED ORDER — HYDROMORPHONE HCL 1 MG/ML IJ SOLN: 0.2500 mg | INTRAMUSCULAR | Status: DC | PRN

## 2017-10-24 MED ORDER — PROPOFOL 10 MG/ML IV BOLUS
INTRAVENOUS | Status: AC
  Filled 2017-10-24: qty 40

## 2017-10-24 MED ORDER — ROCURONIUM BROMIDE 100 MG/10ML IV SOLN
INTRAVENOUS | Status: DC | PRN
  Administered 2017-10-24: 60 mg via INTRAVENOUS

## 2017-10-24 MED ORDER — LACTATED RINGERS IV SOLN
INTRAVENOUS | Status: DC
  Administered 2017-10-24 (×2): via INTRAVENOUS

## 2017-10-24 MED ORDER — MIDAZOLAM HCL 2 MG/2ML IJ SOLN
INTRAMUSCULAR | Status: AC
  Filled 2017-10-24: qty 2

## 2017-10-24 MED ORDER — FENTANYL CITRATE (PF) 100 MCG/2ML IJ SOLN
INTRAMUSCULAR | Status: DC | PRN
  Administered 2017-10-24 (×2): 25 ug via INTRAVENOUS
  Administered 2017-10-24: 50 ug via INTRAVENOUS
  Administered 2017-10-24: 25 ug via INTRAVENOUS
  Administered 2017-10-24: 100 ug via INTRAVENOUS
  Administered 2017-10-24: 50 ug via INTRAVENOUS
  Administered 2017-10-24: 25 ug via INTRAVENOUS

## 2017-10-24 MED ORDER — DEXAMETHASONE SODIUM PHOSPHATE 10 MG/ML IJ SOLN
INTRAMUSCULAR | Status: AC
  Filled 2017-10-24: qty 1

## 2017-10-24 MED ORDER — ONDANSETRON HCL 4 MG/2ML IJ SOLN
INTRAMUSCULAR | Status: AC
  Filled 2017-10-24: qty 2

## 2017-10-24 MED ORDER — CEFAZOLIN SODIUM-DEXTROSE 2-4 GM/100ML-% IV SOLN
2.0000 g | Freq: Once | INTRAVENOUS | Status: AC
  Administered 2017-10-24: 2 g via INTRAVENOUS
  Filled 2017-10-24: qty 100

## 2017-10-24 MED ORDER — KETOROLAC TROMETHAMINE 30 MG/ML IJ SOLN
INTRAMUSCULAR | Status: AC
  Filled 2017-10-24: qty 1

## 2017-10-24 MED ORDER — SODIUM CHLORIDE 0.9 % IJ SOLN
INTRAMUSCULAR | Status: AC
  Filled 2017-10-24: qty 50

## 2017-10-24 MED ORDER — BUPIVACAINE HCL (PF) 0.25 % IJ SOLN
INTRAMUSCULAR | Status: DC | PRN
  Administered 2017-10-24: 30 mL

## 2017-10-24 MED ORDER — BUPIVACAINE HCL (PF) 0.25 % IJ SOLN
INTRAMUSCULAR | Status: AC
  Filled 2017-10-24: qty 30

## 2017-10-24 MED ORDER — METOCLOPRAMIDE HCL 5 MG/ML IJ SOLN
INTRAMUSCULAR | Status: DC | PRN
  Administered 2017-10-24: 10 mg via INTRAVENOUS

## 2017-10-24 MED ORDER — LIDOCAINE HCL (CARDIAC) PF 100 MG/5ML IV SOSY
PREFILLED_SYRINGE | INTRAVENOUS | Status: DC | PRN
  Administered 2017-10-24: 80 mg via INTRAVENOUS

## 2017-10-24 MED ORDER — PROMETHAZINE HCL 25 MG/ML IJ SOLN: 6.2500 mg | INTRAMUSCULAR | Status: DC | PRN

## 2017-10-24 SURGICAL SUPPLY — 53 items
BARRIER ADHS 3X4 INTERCEED (GAUZE/BANDAGES/DRESSINGS) IMPLANT
CABLE HIGH FREQUENCY MONO STRZ (ELECTRODE) ×3 IMPLANT
CANISTER SUCT 3000ML PPV (MISCELLANEOUS) ×3 IMPLANT
CATH FOLEY 3WAY  5CC 16FR (CATHETERS) ×1
CATH FOLEY 3WAY 5CC 16FR (CATHETERS) ×2 IMPLANT
COVER BACK TABLE 60X90IN (DRAPES) ×3 IMPLANT
COVER TIP SHEARS 8 DVNC (MISCELLANEOUS) ×2 IMPLANT
COVER TIP SHEARS 8MM DA VINCI (MISCELLANEOUS) ×1
DECANTER SPIKE VIAL GLASS SM (MISCELLANEOUS) ×6 IMPLANT
DEFOGGER SCOPE WARMER CLEARIFY (MISCELLANEOUS) ×3 IMPLANT
DERMABOND ADVANCED (GAUZE/BANDAGES/DRESSINGS) ×1
DERMABOND ADVANCED .7 DNX12 (GAUZE/BANDAGES/DRESSINGS) ×2 IMPLANT
DRAPE ARM DVNC X/XI (DISPOSABLE) ×8 IMPLANT
DRAPE COLUMN DVNC XI (DISPOSABLE) ×2 IMPLANT
DRAPE DA VINCI XI ARM (DISPOSABLE) ×4
DRAPE DA VINCI XI COLUMN (DISPOSABLE) ×1
DURAPREP 26ML APPLICATOR (WOUND CARE) ×3 IMPLANT
ELECT REM PT RETURN 15FT ADLT (MISCELLANEOUS) ×3 IMPLANT
GLOVE BIO SURGEON STRL SZ7.5 (GLOVE) ×9 IMPLANT
GLOVE BIOGEL PI IND STRL 7.0 (GLOVE) ×4 IMPLANT
GLOVE BIOGEL PI INDICATOR 7.0 (GLOVE) ×2
IRRIG SUCT STRYKERFLOW 2 WTIP (MISCELLANEOUS) ×3
IRRIGATION SUCT STRKRFLW 2 WTP (MISCELLANEOUS) ×2 IMPLANT
NEEDLE INSUFFLATION 150MM (ENDOMECHANICALS) ×3 IMPLANT
OBTURATOR OPTICAL STANDARD 8MM (TROCAR) ×1
OBTURATOR OPTICAL STND 8 DVNC (TROCAR) ×2
OBTURATOR OPTICALSTD 8 DVNC (TROCAR) ×2 IMPLANT
OCCLUDER COLPOPNEUMO (BALLOONS) ×3 IMPLANT
PACK ROBOT WH (CUSTOM PROCEDURE TRAY) ×3 IMPLANT
PACK ROBOTIC GOWN (GOWN DISPOSABLE) ×3 IMPLANT
PACK TRENDGUARD 450 HYBRID PRO (MISCELLANEOUS) ×2 IMPLANT
PAD PREP 24X48 CUFFED NSTRL (MISCELLANEOUS) ×3 IMPLANT
POSITIONER SURGICAL ARM (MISCELLANEOUS) ×6 IMPLANT
SCISSORS LAP 5X35 DISP (ENDOMECHANICALS) ×3 IMPLANT
SEAL CANN UNIV 5-8 DVNC XI (MISCELLANEOUS) ×6 IMPLANT
SEAL XI 5MM-8MM UNIVERSAL (MISCELLANEOUS) ×3
SET CYSTO W/LG BORE CLAMP LF (SET/KITS/TRAYS/PACK) IMPLANT
SET TRI-LUMEN FLTR TB AIRSEAL (TUBING) ×3 IMPLANT
SUT VIC AB 0 CT1 27 (SUTURE) ×2
SUT VIC AB 0 CT1 27XBRD ANBCTR (SUTURE) ×4 IMPLANT
SUT VICRYL 0 UR6 27IN ABS (SUTURE) ×3 IMPLANT
SUT VICRYL RAPIDE 4/0 PS 2 (SUTURE) ×6 IMPLANT
SUT VLOC 180 0 9IN  GS21 (SUTURE)
SUT VLOC 180 0 9IN GS21 (SUTURE) IMPLANT
TIP RUMI ORANGE 6.7MMX12CM (TIP) IMPLANT
TIP UTERINE 5.1X6CM LAV DISP (MISCELLANEOUS) IMPLANT
TIP UTERINE 6.7X10CM GRN DISP (MISCELLANEOUS) IMPLANT
TIP UTERINE 6.7X6CM WHT DISP (MISCELLANEOUS) IMPLANT
TIP UTERINE 6.7X8CM BLUE DISP (MISCELLANEOUS) ×3 IMPLANT
TOWEL OR 17X26 10 PK STRL BLUE (TOWEL DISPOSABLE) ×6 IMPLANT
TRENDGUARD 450 HYBRID PRO PACK (MISCELLANEOUS) ×3
TROCAR PORT AIRSEAL 8X120 (TROCAR) ×3 IMPLANT
WATER STERILE IRR 1000ML POUR (IV SOLUTION) ×3 IMPLANT

## 2017-10-24 NOTE — Op Note (Signed)
NAME: Jessica Diaz, Jessica B. MEDICAL RECORD [id number] ACCOUNT [id number] DATE OF BIRTH:09-30-1990 FACILITY: [facility] LOCATION: [facility] PHYSICIAN:Markeith Jue J. Billy CoastAAVON, MD  OPERATIVE REPORT  DATE OF PROCEDURE:  10/24/2017  PREOPERATIVE DIAGNOSES:  Dysmenorrhea, dyspareunia, recurrent pelvic endometriosis.  POSTOPERATIVE DIAGNOSES:  Dysmenorrhea, dyspareunia, recurrent pelvic endometriosis.  PROCEDURE:  Diagnostic laparoscopy, laparoscopic ablation of endometriosis with lysis of adhesions.  SURGEON:  Olivia Mackieichard Filbert Craze, MD.  Threasa HeadsASSISTANFredric Mare:  Bailey.  ANESTHESIA:  General.  ESTIMATED BLOOD LOSS:  Less than 50 mL.  SPECIMENS:  None.  DRAINS:  Foley.  COUNTS:  Correct.  DISPOSITION:  The patient to recovery in good condition.  FINDINGS:  Powder hemorrhagic like implants in the posterior cul-de-sac and right adnexal area consistent with pelvic endometriosis.  Normal tubes, normal uterus, normal anterior cul-de-sac, normal appendix and gallbladder area, normal ovaries.  DESCRIPTION OF PROCEDURE:  After being apprised of the risks of anesthesia, infection, bleeding, injury to surrounding organs, possible need for repair, delayed versus immediate complications include bowel, bladder injury, possible need for repair.  The  patient was brought to the operating room where she was administered general anesthetic without complications.  Prepped and draped in usual sterile fashion.  Foley catheter placed and RUMI retractor placed vaginally without difficulty.  Exam under  anesthesia reveals a midposition anteflexed uterus and no adnexal masses appreciated.  No cul-de-sac nodularity.  Infraumbilical incision made with a scalpel.  Veress needle placed.  Opening pressure minus -2, 4 liters of CO2 insufflated without  difficulty.  Trocar placed atraumatically.  Visualization revealed findings as noted above.  At this time, attempts were made to put the patient in deep Trendelenburg position; however, due to  malfunctioning of the operating room table, which was not  noted prior to the procedure, we were unable to achieve deep Trendelenburg position during the procedure.  Therefore, the procedure was done laparoscopically.  A 5 mm port was placed to the left of the incision and these implants of endometriosis as  previously mentioned were noted and burned.  There were some adhesions to the sigmoid colon to the left adnexa, which were lysed sharply.  Good visualization of bilateral ovaries and tubes were done.  There was inability to visualize up higher in the  posterior cul-de-sac due to the inability to achieve a deep Trendelenburg position during the procedure.  At this time, there was no obvious evidence of any further endometriosis as noted.  All instruments were removed under direct visualization.  CO2  was released.  Incisions were infiltrated with dilute Marcaine solution and closed with 4-0 Vicryl and Dermabond.  RUMI retractor and Foley catheter were removed.  The patient transferred to recovery in good condition.  TN/NUANCE  D:10/24/2017 T:10/24/2017 JOB:[id number]

## 2017-10-24 NOTE — Anesthesia Procedure Notes (Signed)
Procedure Name: Intubation Date/Time: 10/24/2017 8:20 AM Performed by: Justice Rocher, CRNA Pre-anesthesia Checklist: Patient identified, Emergency Drugs available, Suction available and Patient being monitored Patient Re-evaluated:Patient Re-evaluated prior to induction Oxygen Delivery Method: Circle system utilized Preoxygenation: Pre-oxygenation with 100% oxygen Induction Type: IV induction Ventilation: Mask ventilation without difficulty Laryngoscope Size: Mac and 4 Grade View: Grade II Tube type: Oral Tube size: 7.0 mm Number of attempts: 1 Airway Equipment and Method: Stylet and Oral airway Placement Confirmation: ETT inserted through vocal cords under direct vision,  positive ETCO2 and breath sounds checked- equal and bilateral Secured at: 22 cm Tube secured with: Tape Dental Injury: Teeth and Oropharynx as per pre-operative assessment  Comments: Pt lips chapped with small blister om power lip preop

## 2017-10-24 NOTE — Anesthesia Postprocedure Evaluation (Signed)
Anesthesia Post Note  Patient: Jessica Diaz  Procedure(s) Performed: LAPAROSCOPY DIAGNOSTIC ABLATION ON ENDOMETRIOSIS     Patient location during evaluation: PACU Anesthesia Type: General Level of consciousness: awake and alert Pain management: pain level controlled Vital Signs Assessment: post-procedure vital signs reviewed and stable Respiratory status: spontaneous breathing, nonlabored ventilation and respiratory function stable Cardiovascular status: blood pressure returned to baseline and stable Postop Assessment: no apparent nausea or vomiting Anesthetic complications: no    Last Vitals:  Vitals:   10/24/17 1015 10/24/17 1030  BP: 110/65   Pulse: (!) 55   Resp: 11 12  Temp: 36.7 C 36.4 C  SpO2: 99%     Last Pain:  Vitals:   10/24/17 1030  TempSrc:   PainSc: 2                  Lowella CurbWarren Ray Miller

## 2017-10-24 NOTE — Op Note (Signed)
10/24/2017  9:18 AM  PATIENT:  Jessica LoseAlexis B Remick  27 y.o. female  PRE-OPERATIVE DIAGNOSIS:  ENDOMETRIOSIS  POST-OPERATIVE DIAGNOSIS:  ENDOMETRIOSIS  PROCEDURE:  Procedure(s): LAPAROSCOPY DIAGNOSTIC ABLATION ON ENDOMETRIOSIS LYSIS OF ADHESIONS  SURGEON:  Surgeon(s): Olivia Mackieaavon, Taneasha Fuqua, MD  ASSISTANTS: Montez HagemanBAILEY, FACM, CNM   ANESTHESIA:   local and general  ESTIMATED BLOOD LOSS: MINIMAL   DRAINS: Urinary Catheter (Foley)   LOCAL MEDICATIONS USED:  MARCAINE    and Amount: 30 ml  SPECIMEN:  No Specimen  DISPOSITION OF SPECIMEN:  N/A  COUNTS:  YES  DICTATION #: [id number]  PLAN OF CARE: DC HOME  PATIENT DISPOSITION:  PACU - hemodynamically stable.

## 2017-10-24 NOTE — Anesthesia Preprocedure Evaluation (Signed)
Anesthesia Evaluation  Patient identified by MRN, date of birth, ID band Patient awake    Reviewed: Allergy & Precautions, H&P , NPO status , Patient's Chart, lab work & pertinent test results  History of Anesthesia Complications (+) PONV  Airway Mallampati: II  TM Distance: >3 FB Neck ROM: Full    Dental no notable dental hx.    Pulmonary neg pulmonary ROS,    Pulmonary exam normal breath sounds clear to auscultation       Cardiovascular negative cardio ROS Normal cardiovascular exam Rhythm:Regular Rate:Normal     Neuro/Psych negative neurological ROS  negative psych ROS   GI/Hepatic negative GI ROS, Neg liver ROS,   Endo/Other  negative endocrine ROS  Renal/GU negative Renal ROS  negative genitourinary   Musculoskeletal negative musculoskeletal ROS (+)   Abdominal   Peds negative pediatric ROS (+)  Hematology negative hematology ROS (+)   Anesthesia Other Findings Endometriosis  Reproductive/Obstetrics negative OB ROS                             Anesthesia Physical  Anesthesia Plan  ASA: II  Anesthesia Plan: General   Post-op Pain Management:    Induction: Intravenous  PONV Risk Score and Plan: 4 or greater and Ondansetron, Dexamethasone, Midazolam and Scopolamine patch - Pre-op  Airway Management Planned: Oral ETT  Additional Equipment:   Intra-op Plan:   Post-operative Plan: Extubation in OR  Informed Consent: I have reviewed the patients History and Physical, chart, labs and discussed the procedure including the risks, benefits and alternatives for the proposed anesthesia with the patient or authorized representative who has indicated his/her understanding and acceptance.   Dental advisory given  Plan Discussed with: CRNA and Surgeon  Anesthesia Plan Comments:                                          Anesthesia Evaluation  Patient identified by MRN, date  of birth, ID band Patient awake    Reviewed: Allergy & Precautions, H&P , Patient's Chart, lab work & pertinent test results  Airway Mallampati: II  TM Distance: >3 FB Neck ROM: full    Dental  (+) Teeth Intact   Pulmonary  breath sounds clear to auscultation        Cardiovascular + dysrhythmias Ventricular Tachycardia Rhythm:regular Rate:Normal     Neuro/Psych    GI/Hepatic   Endo/Other    Renal/GU      Musculoskeletal   Abdominal   Peds  Hematology   Anesthesia Other Findings   No Lovenox for more than 24hr    Reproductive/Obstetrics (+) Pregnancy                             Anesthesia Physical Anesthesia Plan  ASA: II  Anesthesia Plan: Epidural   Post-op Pain Management:    Induction:   Airway Management Planned:   Additional Equipment:   Intra-op Plan:   Post-operative Plan:   Informed Consent: I have reviewed the patients History and Physical, chart, labs and discussed the procedure including the risks, benefits and alternatives for the proposed anesthesia with the patient or authorized representative who has indicated his/her understanding and acceptance.   Dental Advisory Given  Plan Discussed with:   Anesthesia Plan Comments: (Labs checked- platelets confirmed with RN in  room. Fetal heart tracing, per RN, reported to be stable enough for sitting procedure. Discussed epidural, and patient consents to the procedure:  included risk of possible headache,backache, failed block, allergic reaction, and nerve injury. This patient was asked if she had any questions or concerns before the procedure started.)        Anesthesia Quick Evaluation  Anesthesia Quick Evaluation

## 2017-10-24 NOTE — Discharge Instructions (Signed)

## 2017-10-24 NOTE — Progress Notes (Signed)
Patient seen and examined. Consent witnessed and signed. No changes noted. Update completed. 

## 2017-10-24 NOTE — Transfer of Care (Signed)
Immediate Anesthesia Transfer of Care Note  Patient: Jessica Diaz  Procedure(s) Performed: Procedure(s): LAPAROSCOPY DIAGNOSTIC ABLATION ON ENDOMETRIOSIS  Patient Location: PACU  Anesthesia Type: General  Level of Consciousness: awake, sedated, patient cooperative and responds to stimulation  Airway & Oxygen Therapy: Patient Spontanous Breathing and Patient connected to face mask oxygen Transported with HOB elevated,   Post-op Assessment: Report given to PACU RN, Post -op Vital signs reviewed and stable and Patient moving all extremities  Post vital signs: Reviewed and stable  Complications: No apparent anesthesia complications

## 2017-10-24 NOTE — H&P (Addendum)
Expand All Collapse All       Show:Clear all [x] Manual[x] Template[] Copied  Added by: [x] Olivia Mackie, MD   [] Hover for details HISTORY AND PHYSICAL EXAM  Jessica Diaz is an 27 y.o. female. Dysmenorrhea and dyspareunia for surgical therapy  Pertinent Gynecological History: Menses: flow is moderate Bleeding: nl menses Contraception: none DES exposure: denies Blood transfusions: none Sexually transmitted diseases: no past history Previous GYN Procedures: DNC  Last mammogram: na Date: na Last pap: normal Date: 2019 OB History: G1, P1       Menstrual History: Menarche age: 71 Patient's last menstrual period was 09/22/2017 (approximate).      Past Medical History:  Diagnosis Date  . Arrhythmia    non sustained VT needs cardiac monitoring in labor  . Bleeding in early pregnancy 02/24/2014  . Endometriosis 2008   lap endometriosis fulgration- 2008  . Family history of anesthesia complication    mother and father have N/V  . Hemorrhoids   . History of herpetic whitlow 09/2016  . History of kidney stones 2014   Sx: cystoscopy w/retrograde pyelogram, ureteroscopy and stent placement  . History of pilonidal cyst   . Hx of left knee surgery 2007  . Hx of varicella   . Hx of wisdom tooth extraction 2013  . Intermittent palpitations   . Migraine   . Nonsustained ventricular tachycardia (HCC)   . Ovarian cyst    Left  . Personal history of DVT (deep vein thrombosis) 2014   blood clot in right arm post renal stone surgery 01/12/13-on blood thinnners (Lovenox)  . PONV (postoperative nausea and vomiting)    did not have N/V after cysto  . Postpartum care following vaginal delivery (6/7) 09/20/2014  . Subchorionic hemorrhage, antepartum 2016  . Syncope 2013  . UTI (lower urinary tract infection)          Past Surgical History:  Procedure Laterality Date  . CYSTOSCOPY WITH RETROGRADE PYELOGRAM, URETEROSCOPY AND STENT PLACEMENT Right 01/12/2013     Procedure: CYSTOSCOPY WITH RETROGRADE PYELOGRAM, URETEROSCOPY AND STENT PLACEMENT;  Surgeon: Valetta Fuller, MD;  Location: [facility];  Service: Urology;  Laterality: Right;  with stone basket extraction  . KNEE SURGERY Left 2007  . LAPAROSCOPIC ENDOMETRIOSIS FULGURATION  2008  . WISDOM TOOTH EXTRACTION  2013   two lowers and one upper         Family History  Problem Relation Age of Onset  . Heart attack Maternal Grandfather   . Breast cancer Mother 55  . Gestational diabetes Mother   . Hypertension Mother   . Hyperlipidemia Mother   . Heart disease Father        SBE, aorta replacement @ age 52  . Hypertension Father   . Hyperlipidemia Father   . Lung cancer Other        paternal side  . Diabetes Maternal Grandmother   . Healthy Sister        x3    Social History:  reports that she has never smoked. She has never used smokeless tobacco. She reports that she does not drink alcohol or use drugs.  Allergies:       Allergies  Allergen Reactions  . Pineapple Anaphylaxis  . Codeine Other (See Comments)    Pt states this medication causes her bp to rise.    Leilani Merl [Oxymetazoline] Other (See Comments)    Pt states this medication causes her bp to drop.    Marland Kitchen Percocet [Oxycodone-Acetaminophen] Other (See Comments)  Pt states this medication causes her to hallucinate.    . Latex Rash    No medications prior to admission.    Review of Systems  Constitutional: Negative.   All other systems reviewed and are negative.   Last menstrual period 09/22/2017. Physical Exam  Nursing note and vitals reviewed. Constitutional: She is oriented to person, place, and time. She appears well-developed and well-nourished.  HENT:  Head: Normocephalic and atraumatic.  Neck: Normal range of motion. Neck supple.  Cardiovascular: Normal rate and regular rhythm.  Respiratory: Effort normal and breath sounds normal.  GI: Soft. Bowel sounds are  normal.  Genitourinary: Vagina normal and uterus normal.  Musculoskeletal: Normal range of motion.  Neurological: She is alert and oriented to person, place, and time. She has normal reflexes.  Skin: Skin is warm and dry.  Psychiatric: She has a normal mood and affect.    Lab Results Last 24 Hours  No results found for this or any previous visit (from the past 24 hour(s)).    Imaging Results (Last 48 hours)  No results found.    Assessment/Plan: Dysmenorrhea Dyspareunia History of Endometriosis Davinci assisted LS excision and ablation of endometriosis Risks of infection, bleeding, possible injury to surrounding discussed.. Inability to cure pain discussed. Consent done.  [name] J 10/24/2017, 6:45 AM

## 2017-10-25 ENCOUNTER — Encounter (HOSPITAL_COMMUNITY): Payer: Self-pay | Admitting: Obstetrics and Gynecology

## 2017-11-27 ENCOUNTER — Telehealth: Payer: Self-pay | Admitting: Family Medicine

## 2017-11-27 MED ORDER — VALACYCLOVIR HCL 1 G PO TABS: ORAL_TABLET | ORAL | 3 refills | Status: DC

## 2017-11-27 NOTE — Telephone Encounter (Signed)
Ok to refill 

## 2017-11-27 NOTE — Telephone Encounter (Signed)
Please advise of refill for patient

## 2017-11-27 NOTE — Telephone Encounter (Signed)
Copied from CRM [phone]#[id number]. Topic: Quick Communication - See Telephone Encounter >> Nov 27, 2017  9:28 AM Jolayne Hainesaylor, Brittany L wrote: CRM for notification. See Telephone encounter for: 11/27/17.  Patient called and said Dr Beverely Lowabori advises her to call when she needs her valACYclovir (VALTREX) 1000 MG tablet [[id number]][[id number]]  DISCONTINUED. It's for the patch on her elbow. Please advise.  [id number] - [place], [place] - [address] [place] [phone]

## 2017-11-27 NOTE — Telephone Encounter (Signed)
Medication filled to pharmacy as requested.   

## 2017-11-27 NOTE — Telephone Encounter (Signed)
Ok for #21, 3 refills

## 2017-12-03 ENCOUNTER — Encounter: Payer: Self-pay | Admitting: Family Medicine

## 2017-12-03 ENCOUNTER — Other Ambulatory Visit: Payer: Self-pay

## 2017-12-03 ENCOUNTER — Ambulatory Visit (INDEPENDENT_AMBULATORY_CARE_PROVIDER_SITE_OTHER): Payer: No Typology Code available for payment source | Admitting: Family Medicine

## 2017-12-03 VITALS — BP 114/78 | HR 84 | Temp 97.8°F | Resp 16 | Ht 64.25 in | Wt 137.2 lb

## 2017-12-03 DIAGNOSIS — B029 Zoster without complications: Secondary | ICD-10-CM | POA: Diagnosis not present

## 2017-12-03 MED ORDER — VALACYCLOVIR HCL 1 G PO TABS: 1000.0000 mg | ORAL_TABLET | Freq: Every day | ORAL | 3 refills | Status: DC

## 2017-12-03 NOTE — Progress Notes (Signed)
   Subjective:    Patient ID: Consuella LoseAlexis B Nazzaro, female    DOB: 07/14/1990, 27 y.o.   MRN: [id number]  HPI Shingles- pt had what appeared to be a herpetic whitlow on L ring finger last summer.  Afterwards, developed painful, burning, vesicular rash on R elbow and upper arm.  This summer, developed a similar painful burning rash on L inner thigh.  Areas do not breakout at the same time.  Longest stretch of being asymptomatic was 3 months over the winter.  Pt will have pain preceding the onset of rash and then area is so sensitive that even a sheet causes pain.   Review of Systems For ROS see HPI     Objective:   Physical Exam  Constitutional: She appears well-developed and well-nourished. No distress.  HENT:  Head: Normocephalic and atraumatic.  Skin: Skin is warm and dry. Rash (pt w/ ruptured vesicular rash superior to R elbow, very TTP) noted.  Psychiatric: She has a normal mood and affect. Her behavior is normal. Thought content normal.  Vitals reviewed.         Assessment & Plan:  Shingles- pt appears to be having recurrent shingles outbreaks.  They are occurring in different locations but never at the same time.  Was able to do culture from elbow lesions today.  Finish tx dose of Valtrex and then start once daily suppressive dose.  Reviewed supportive care and red flags that should prompt return.  Pt expressed understanding and is in agreement w/ plan.

## 2017-12-03 NOTE — Patient Instructions (Signed)
Follow up as needed or as scheduled We'll notify you of your lab results and make any changes if needed Once you are done with the 3x/day dosing, start the once daily Valtrex dosing Call with any questions or concerns Hang in there!

## 2017-12-04 LAB — TIQ-NTM

## 2017-12-08 ENCOUNTER — Telehealth: Payer: Self-pay | Admitting: Emergency Medicine

## 2017-12-08 NOTE — Telephone Encounter (Signed)
-----   Message from Sheliah HatchKatherine E Tabori, MD sent at 12/04/2017  3:04 PM EDT ----- Can you please check on pt's herpes culture?  I don't understand this message  Thanks! KT ----- Message ----- From: Janace HoardInterface, [facility] Lab Results In Sent: 12/04/2017   1:45 PM EDT To: Sheliah HatchKatherine E Tabori, MD

## 2017-12-08 NOTE — Telephone Encounter (Signed)
Spoke with [facility], the Test Code was changed and [facility].   Kathi SimpersAmy Creek Gan,  LPN

## 2017-12-09 LAB — HERPES SIMPLEX VIRUS CULTURE

## 2017-12-09 LAB — SURESWAB HSV, TYPE 1/2 DNA, PCR
HSV 1 DNA: NOT DETECTED
HSV 2 DNA: DETECTED — AB

## 2018-03-23 ENCOUNTER — Emergency Department (HOSPITAL_COMMUNITY): Payer: No Typology Code available for payment source

## 2018-03-23 ENCOUNTER — Emergency Department (HOSPITAL_COMMUNITY)
Admission: EM | Admit: 2018-03-23 | Discharge: 2018-03-23 | Disposition: A | Discharge status: Home / Self Care | Payer: No Typology Code available for payment source | Attending: Emergency Medicine | Admitting: Emergency Medicine

## 2018-03-23 ENCOUNTER — Encounter (HOSPITAL_COMMUNITY): Payer: Self-pay

## 2018-03-23 DIAGNOSIS — R002 Palpitations: Secondary | ICD-10-CM | POA: Insufficient documentation

## 2018-03-23 DIAGNOSIS — Z79899 Other long term (current) drug therapy: Secondary | ICD-10-CM | POA: Insufficient documentation

## 2018-03-23 DIAGNOSIS — Z91040 Latex allergy status: Secondary | ICD-10-CM | POA: Insufficient documentation

## 2018-03-23 LAB — CBC
HCT: 40.4 % (ref 36.0–46.0)
Hemoglobin: 12.9 g/dL (ref 12.0–15.0)
MCH: 28.4 pg (ref 26.0–34.0)
MCHC: 31.9 g/dL (ref 30.0–36.0)
MCV: 89 fL (ref 80.0–100.0)
Platelets: 241 10*3/uL (ref 150–400)
RBC: 4.54 MIL/uL (ref 3.87–5.11)
RDW: 13.1 % (ref 11.5–15.5)
WBC: 6.9 10*3/uL (ref 4.0–10.5)
nRBC: 0 % (ref 0.0–0.2)

## 2018-03-23 LAB — BASIC METABOLIC PANEL
Anion gap: 10 (ref 5–15)
BUN: 10 mg/dL (ref 6–20)
CALCIUM: 9.5 mg/dL (ref 8.9–10.3)
CO2: 24 mmol/L (ref 22–32)
Chloride: 106 mmol/L (ref 98–111)
Creatinine, Ser: 0.73 mg/dL (ref 0.44–1.00)
GFR calc Af Amer: >60 mL/min (ref >60)
GFR calc non Af Amer: >60 mL/min (ref >60)
Glucose, Bld: 110 mg/dL — ABNORMAL HIGH (ref 70–99)
Potassium: 3.3 mmol/L — ABNORMAL LOW (ref 3.5–5.1)
Sodium: 140 mmol/L (ref 135–145)

## 2018-03-23 LAB — I-STAT TROPONIN, ED: Troponin i, poc: 0 ng/mL (ref 0.00–0.08)

## 2018-03-23 LAB — I-STAT BETA HCG BLOOD, ED (MC, WL, AP ONLY): I-stat hCG, quantitative: <5 m[IU]/mL (ref <5)

## 2018-03-23 NOTE — ED Triage Notes (Signed)
Pt presents to ED for evaluation of palpitations since this morning. Pt states she has hx of same, states she was diagnosed with an arrhythmia several years ago. Pt endorses near syncope with episodes of palpitations which is a new symptom. Pt denies LOC, is A+Ox4, in NAD, skin warm and dry. Pt endorses SOB with the palpitations as well.

## 2018-03-23 NOTE — Discharge Instructions (Signed)
Schedule an appointment with Dr. Elberta Fortisamnitz to follow-up on your visit today. Return to the emergency department for new or worsening symptoms.

## 2018-03-23 NOTE — ED Provider Notes (Addendum)
[facility] EMERGENCY DEPARTMENT Provider Note   CSN: [id number] Arrival date & time: 03/23/18  1721     History   Chief Complaint Chief Complaint  Patient presents with  . Palpitations    HPI Jessica Diaz is a 27 y.o. female with past medical history of intermittent nonsustained V. tach, presenting to the emergency department with complaint of 2 episodes of palpitations today.  Patient states she has diagnosis of 4 beat run of ventricular tachycardia which she has frequently.  She states this feels like skipped beats.  She states today she woke up with a pressure in her chest, which is normal for her, she states this indicates to her that she will have a "heart day."  When this happened she expects to have the skipped beats throughout the day.  Today was different because she had an episode of her heart racing when she was standing at the dryer.  She states this made her feel lightheaded as if she was going to pass out.  She states it subsided, however after she went to sit on the couch this recurred.  Her father checked her heart rate which was 135 bpm.  She states this lasted about 7 minutes and then subsided once again.  She states she has never felt this way before with her palpitations, and the lightheadedness worried her.  She called Dr. Elberta Fortis office, her cardiologist, who informed her they would be unable to see her until January. Currently asymptomatic, with the exception of reporting that she is tired.  Per chart review, patient evaluated by Dr. Elberta Fortis last year.  It was documented she had a 4 beat run of ventricular tachycardia with PVCs.  She was started on a propranolol, however patient did not like the medication and therefore does not take it.  The history is provided by the patient and medical records.    Past Medical History:  Diagnosis Date  . Arrhythmia    non sustained VT needs cardiac monitoring in labor  . Bleeding in early pregnancy 02/24/2014   . Endometriosis 2008   lap endometriosis fulgration- 2008  . Family history of anesthesia complication    mother and father have N/V  . Hemorrhoids   . History of herpetic whitlow 09/2016  . History of kidney stones 2014   Sx: cystoscopy w/retrograde pyelogram, ureteroscopy and stent placement  . History of pilonidal cyst   . Hx of left knee surgery 2007  . Hx of varicella   . Hx of wisdom tooth extraction 2013  . Intermittent palpitations   . Migraine   . Nonsustained ventricular tachycardia (HCC)   . Ovarian cyst    Left  . Personal history of DVT (deep vein thrombosis) 2014   blood clot in right arm post renal stone surgery 01/12/13-on blood thinnners (Lovenox)  . PONV (postoperative nausea and vomiting)    did not have N/V after cysto  . Postpartum care following vaginal delivery (6/7) 09/20/2014  . Subchorionic hemorrhage, antepartum 2016  . Syncope 2013  . UTI (lower urinary tract infection)     Patient Active Problem List   Diagnosis Date Noted  . Mastitis 10/14/2015  . Calf pain 03/31/2015  . Vacuum extraction, delivered, current hospitalization 09/21/2014  . Postpartum care following vaginal delivery (6/7) 09/20/2014  . 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 Sinusitis, acute maxillary 06/23/2014  . Bleeding in early pregnancy 02/24/2014  . Breast pain, right 07/12/2013  . Superficial phlebitis  of arm 01/28/2013  . Wrist pain, right 01/27/2013  . Ureteral calculi 01/12/2013  . Pilonidal cyst 09/25/2012  . Abnormal TSH 11/22/2011  . Spider bite 11/22/2011  . Migraine 11/22/2011  . Sore throat 09/30/2011  . HEMATURIA, HX OF 08/08/2009  . Acute sinusitis 06/28/2009  . ALLERGIC RHINITIS 06/28/2009  . DYSPNEA/SHORTNESS OF BREATH 01/11/2009  . CONTACT DERMATITIS 07/05/2008  . SYNCOPE 04/26/2008  . CARDIAC MURMUR, AORTIC 07/13/2007  . ENDOMETRIOSIS, SITE UNSPECIFIED 06/25/2007  . PALPITATIONS, HX OF 06/25/2007     Past Surgical History:  Procedure Laterality Date  . ABLATION ON ENDOMETRIOSIS  10/24/2017   Procedure: ABLATION ON ENDOMETRIOSIS;  Surgeon: Olivia Mackieaavon, Richard, MD;  Location: [facility];  Service: Gynecology;;  . Bluford KaufmannYSTOSCOPY WITH RETROGRADE PYELOGRAM, URETEROSCOPY AND STENT PLACEMENT Right 01/12/2013   Procedure: CYSTOSCOPY WITH RETROGRADE PYELOGRAM, URETEROSCOPY AND STENT PLACEMENT;  Surgeon: Valetta Fulleravid S Grapey, MD;  Location: [facility];  Service: Urology;  Laterality: Right;  with stone basket extraction  . KNEE SURGERY Left 2007  . LAPAROSCOPIC ENDOMETRIOSIS FULGURATION  2008  . LAPAROSCOPY  10/24/2017   Procedure: LAPAROSCOPY DIAGNOSTIC;  Surgeon: Olivia Mackieaavon, Richard, MD;  Location: [facility];  Service: Gynecology;;  . WISDOM TOOTH EXTRACTION  2013   two lowers and one upper     OB History    Gravida  2   Para  2   Term  2   Preterm      AB      Living  2     SAB      TAB      Ectopic      Multiple  0   Live Births  2            Home Medications    Prior to Admission medications   Medication Sig Start Date End Date Taking? Authorizing Provider  acetaminophen (TYLENOL) 500 MG tablet Take 1,000 mg by mouth every 8 (eight) hours as needed for mild pain or moderate pain.   Yes [provider]  valACYclovir (VALTREX) 1000 MG tablet Take 1 tablet (1,000 mg total) by mouth daily. 12/03/17  Yes Sheliah Hatchabori, Katherine E, MD  HYDROcodone-acetaminophen (NORCO/VICODIN) 5-325 MG tablet Take 1 tablet by mouth every 6 (six) hours as needed for moderate pain. Patient not taking: Reported on 12/03/2017 10/24/17   Olivia Mackieaavon, Richard, MD    Family History Family History  Problem Relation Age of Onset  . Heart attack Maternal Grandfather   . Breast cancer Mother [id number]  . Gestational diabetes Mother   . Hypertension Mother   . Hyperlipidemia Mother   . Heart disease Father        SBE, aorta replacement @ age 28  . Hypertension Father   . Hyperlipidemia Father   . Lung cancer Other         paternal side  . Diabetes Maternal Grandmother   . Healthy Sister        x3    Social History Social History   Tobacco Use  . Smoking status: Never Smoker  . Smokeless tobacco: Never Used  . Tobacco comment: never used product  Substance Use Topics  . Alcohol use: No    Alcohol/week: 0.0 standard drinks  . Drug use: No     Allergies   Codeine; Decongestant [oxymetazoline]; Percocet [oxycodone-acetaminophen]; and Latex   Review of Systems Review of Systems  Cardiovascular: Positive for chest pain (Chest pressure) and palpitations.  Neurological: Positive for light-headedness.  All other systems reviewed and are negative.  Physical Exam Updated Vital Signs BP 111/79   Pulse 75   Temp 98.8 F (37.1 C) (Oral)   Resp 16   Ht 5' 4.5" (1.638 m)   Wt 65.8 kg   LMP 03/22/2018 (Exact Date)   SpO2 100%   BMI 24.50 kg/m   Physical Exam  Constitutional: She appears well-developed and well-nourished. No distress.  HENT:  Head: Normocephalic and atraumatic.  Eyes: Conjunctivae are normal.  Neck: Normal range of motion. Neck supple.  Cardiovascular: Normal rate, regular rhythm, normal heart sounds and intact distal pulses.  Pulmonary/Chest: Effort normal and breath sounds normal. No respiratory distress.  Abdominal: Soft. Bowel sounds are normal. There is no tenderness.  Musculoskeletal: She exhibits no edema.  Neurological: She is alert.  Skin: Skin is warm.  Psychiatric: She has a normal mood and affect. Her behavior is normal.  Nursing note and vitals reviewed.    ED Treatments / Results  Labs (all labs ordered are listed, but only abnormal results are displayed) Labs Reviewed  BASIC METABOLIC PANEL - Abnormal; Notable for the following components:      Result Value   Potassium 3.3 (*)    Glucose, Bld 110 (*)    All other components within normal limits  CBC  I-STAT TROPONIN, ED  I-STAT BETA HCG BLOOD, ED ([facility], [facility], [facility] ONLY)    EKG EKG  Interpretation  Date/Time:  Monday March 23 2018 18:32:31 EST Ventricular Rate:  84 PR Interval:  134 QRS Duration: 92 QT Interval:  328 QTC Calculation: 387 R Axis:   70 Text Interpretation:  Normal sinus rhythm with sinus arrhythmia Nonspecific T wave abnormality Abnormal ECG Confirmed by Kennis Carina [phone]) on 03/23/2018 8:27:31 PM   Radiology Dg Chest 2 View  Result Date: 03/23/2018 CLINICAL DATA:  27 y/o F; 1 day of heart palpitations and shortness of breath. EXAM: CHEST - 2 VIEW COMPARISON:  10/08/2007 chest radiograph. FINDINGS: Stable heart size and mediastinal contours are within normal limits. Both lungs are clear. The visualized skeletal structures are unremarkable. IMPRESSION: No acute pulmonary process identified. Electronically Signed   By: Mitzi Hansen M.D.   On: 03/23/2018 18:32    Procedures Procedures (including critical care time)  Medications Ordered in ED Medications - No data to display   Initial Impression / Assessment and Plan / ED Course  I have reviewed the triage vital signs and the nursing notes.  Pertinent labs & imaging results that were available during my care of the patient were reviewed by me and considered in my medical decision making (see chart for details).  Clinical Course as of Mar 23 2122  Mon Mar 23, 2018  2059 Pt discussed with cardiology, Minerva Areola, who recommends patient is safe for discharge. He states this arrhythmia does not confer a risk for sudden death and encouraged I provide patient reassurance. He recommends she follow up with Dr. Elberta Fortis, she may benefit from further medical treatment or ablation. He recommends starting metoprolol if pt agreeable. Discussed this with patient, she declines beta blocker. Agreeable to plan.   [JR]    Clinical Course User Index [JR] Robinson, [place] N, PA-C    Patient with past medical history of PVCs and nonsustained ventricular tachycardia, presenting to the emergency department  with palpitations today.  Palpitations are different, described as heart racing with associated near syncopal episodes.  Endorses chest pressure, however this is not new with her palpitations.  Currently asymptomatic in the ED. Followed by Dr. Elberta Fortis.  Electrolytes are reassuring  today.  Troponin negative.  hCG negative.  Chest x-ray negative.  EKG unremarkable.  Patient was discussed with cardiology considering her presyncopal symptoms with her episodes today.  Cardiology recommending patient is safe for discharge with outpatient follow-up.  Recommend start a beta-blocker if patient willing, however patient declines.  Discussed reasons to return to the emergency department.  She is agreeable to plan and safe for discharge.  Pt discussed with Dr. Pilar Plate.  Discussed results, findings, treatment and follow up. Patient advised of return precautions. Patient verbalized understanding and agreed with plan.  Final Clinical Impressions(s) / ED Diagnoses   Final diagnoses:  Palpitations    ED Discharge Orders    None       Robinson, [place] N, PA-C 03/23/18 2124    Robinson, [place] N, PA-C 03/23/18 2125    Sabas Sous, MD 03/24/18 [phone]

## 2018-03-26 ENCOUNTER — Other Ambulatory Visit: Payer: Self-pay | Admitting: General Practice

## 2018-03-26 MED ORDER — VALACYCLOVIR HCL 1 G PO TABS: 1000.0000 mg | ORAL_TABLET | Freq: Every day | ORAL | 3 refills | Status: DC

## 2018-03-27 ENCOUNTER — Ambulatory Visit: Payer: No Typology Code available for payment source | Admitting: Cardiology

## 2018-03-27 ENCOUNTER — Encounter: Payer: Self-pay | Admitting: Cardiology

## 2018-03-27 VITALS — BP 112/74 | HR 75 | Ht 64.5 in | Wt 148.0 lb

## 2018-03-27 DIAGNOSIS — R002 Palpitations: Secondary | ICD-10-CM | POA: Diagnosis not present

## 2018-03-27 DIAGNOSIS — I493 Ventricular premature depolarization: Secondary | ICD-10-CM

## 2018-03-27 NOTE — Patient Instructions (Signed)
Medication Instructions:  Your physician recommends that you continue on your current medications as directed. Please refer to the Current Medication list given to you today.  * If you need a refill on your cardiac medications before your next appointment, please call your pharmacy.   Labwork: None ordered  Testing/Procedures: Your physician has recommended that you wear an event monitor. Event monitors are medical devices that record the heart's electrical activity. Doctors most often [place] these monitors to diagnose arrhythmias. Arrhythmias are problems with the speed or rhythm of the heartbeat. The monitor is a small, portable device. You can wear one while you do your normal daily activities. This is usually used to diagnose what is causing palpitations/syncope (passing out).  Follow-Up: Your physician recommends that you schedule a follow-up appointment in: 6 weeks with Dr. Elberta Fortis (after event monitor has been completed)   Thank you for choosing [facility]!!   Dory Horn, RN [phone]  Any Other Special Instructions Will Be Listed Below (If Applicable).   Cardiac Event Monitoring A cardiac event monitor is a small recording device that is used to detect abnormal heart rhythms (arrhythmias). The monitor is used to record your heart rhythm when you have symptoms, such as:  Fast heartbeats (palpitations), such as heart racing or fluttering.  Dizziness.  Fainting or light-headedness.  Unexplained weakness.  Some monitors are wired to electrodes placed on your chest. Electrodes are flat, sticky disks that attach to your skin. Other monitors may be hand-held or worn on the wrist. The monitor can be worn for up to 30 days. If the monitor is attached to your chest, a technician will prepare your chest for the electrode placement and show you how to work the monitor. Take time to practice using the monitor before you leave the office. Make sure you understand how to send the  information from the monitor to your health care provider. In some cases, you may need to use a landline telephone instead of a cell phone. What are the risks? Generally, this device is safe to use, but it possible that the skin under the electrodes will become irritated. How to use your cardiac event monitor  Wear your monitor at all times, except when you are in water: ? Do not let the monitor get wet. ? Take the monitor off when you bathe. Do not swim or use a hot tub with it on.  Keep your skin clean. Do not put body lotion or moisturizer on your chest.  Change the electrodes as told by your health care provider or any time they stop sticking to your skin. You may need to use medical tape to keep them on.  Try to put the electrodes in slightly different places on your chest to help prevent skin irritation. They must remain in the area under your left breast and in the upper right section of your chest.  Make sure the monitor is safely clipped to your clothing or in a location close to your body that your health care provider recommends.  Press the button to record as soon as you feel heart-related symptoms, such as: ? Dizziness. ? Weakness. ? Light-headedness. ? Palpitations. ? Thumping or pounding in your chest. ? Shortness of breath. ? Unexplained weakness.  Keep a diary of your activities, such as walking, doing chores, and taking medicine. It is very important to note what you were doing when you pushed the button to record your symptoms. This will help your health care provider determine what  might be contributing to your symptoms.  Send the recorded information as recommended by your health care provider. It may take some time for your health care provider to process the results.  Change the batteries as told by your health care provider.  Keep electronic devices away from your monitor. This includes: ? Tablets. ? MP3 players. ? Cell phones.  While wearing your monitor  you should avoid: ? Electric blankets. ? Firefighterlectric razors. ? Electric toothbrushes. ? Microwave ovens. ? Magnets. ? Metal detectors. Get help right away if:  You have chest pain.  You have extreme difficulty breathing or shortness of breath.  You develop a very fast heartbeat that persists.  You develop dizziness that does not go away.  You faint or constantly feel like you are about to faint. Summary  A cardiac event monitor is a small recording device that is used to help detect abnormal heart rhythms (arrhythmias).  The monitor is used to record your heart rhythm when you have heart-related symptoms.  Make sure you understand how to send the information from the monitor to your health care provider.  It is important to press the button on the monitor when you have any heart-related symptoms.  Keep a diary of your activities, such as walking, doing chores, and taking medicine. It is very important to note what you were doing when you pushed the button to record your symptoms. This will help your health care provider learn what might be causing your symptoms. This information is not intended to replace advice given to you by your health care provider. Make sure you discuss any questions you have with your health care provider. Document Released: 01/09/2008 Document Revised: 03/16/2016 Document Reviewed: 03/16/2016 Elsevier Interactive Patient Education  2017 [employer]

## 2018-03-27 NOTE — Progress Notes (Signed)
Electrophysiology Office Note   Date:  03/27/2018   ID:  Jessica Diaz, DOB Sep 18, 1990, MRN [id number]  PCP:  Sheliah Hatch, MD  Cardiologist:  Sherilyn Banker Primary Electrophysiologist:  Regan Lemming, MD    No chief complaint on file.    History of Present Illness: Jessica Diaz is a 27 y.o. female who presents today for electrophysiology evaluation.   She presented to her cardiologist with tachycardia and palpitations during pregnancy. This is an 8-1/2 weeks when the monitor showed a 4 beat run of VT. PTT was normal at that time. She is currently 6 months postpartum. She was found to have a DVT in September 2015 and was put on the relative at that time. 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   He presented to the emergency room 03/23/2018 with 2 episodes of palpitations.  She awoke with pressure in her chest which is normal for her.  When this happens she feels that she Jessica Diaz have palpitations throughout the day.  She had an episode of palpitations during that day which made her feel lightheaded.  This occurred when she was standing at the dryer.  It subsided.  She went to the couch but it recurred.  Her father checked her heart rate which was 135 bpm.  This lasted for approximately 7 minutes.  This was a different feeling for her and was associated with lightheadedness.   Today, denies symptoms of palpitations, chest pain, shortness of breath, orthopnea, PND, lower extremity edema, claudication, dizziness, presyncope, syncope, bleeding, or neurologic sequela. The patient is tolerating medications without difficulties.     Past Medical History:  Diagnosis Date  . Arrhythmia    non sustained VT needs cardiac monitoring in labor  . Bleeding in early pregnancy 02/24/2014  . Endometriosis 2008   lap endometriosis fulgration- 2008  . Family history of anesthesia complication    mother and father have N/V  . Hemorrhoids   . History of herpetic whitlow  09/2016  . History of kidney stones 2014   Sx: cystoscopy w/retrograde pyelogram, ureteroscopy and stent placement  . History of pilonidal cyst   . Hx of left knee surgery 2007  . Hx of varicella   . Hx of wisdom tooth extraction 2013  . Intermittent palpitations   . Migraine   . Nonsustained ventricular tachycardia (HCC)   . Ovarian cyst    Left  . Personal history of DVT (deep vein thrombosis) 2014   blood clot in right arm post renal stone surgery 01/12/13-on blood thinnners (Lovenox)  . PONV (postoperative nausea and vomiting)    did not have N/V after cysto  . Postpartum care following vaginal delivery (6/7) 09/20/2014  . Subchorionic hemorrhage, antepartum 2016  . Syncope 2013  . UTI (lower urinary tract infection)    Past Surgical History:  Procedure Laterality Date  . ABLATION ON ENDOMETRIOSIS  10/24/2017   Procedure: ABLATION ON ENDOMETRIOSIS;  Surgeon: Olivia Mackie, MD;  Location: [facility];  Service: Gynecology;;  . Bluford Kaufmann WITH RETROGRADE PYELOGRAM, URETEROSCOPY AND STENT PLACEMENT Right 01/12/2013   Procedure: CYSTOSCOPY WITH RETROGRADE PYELOGRAM, URETEROSCOPY AND STENT PLACEMENT;  Surgeon: Valetta Fuller, MD;  Location: [facility];  Service: Urology;  Laterality: Right;  with stone basket extraction  . KNEE SURGERY Left 2007  . LAPAROSCOPIC ENDOMETRIOSIS FULGURATION  2008  . LAPAROSCOPY  10/24/2017   Procedure: LAPAROSCOPY DIAGNOSTIC;  Surgeon: Olivia Mackie, MD;  Location: [facility];  Service: Gynecology;;  .  WISDOM TOOTH EXTRACTION  2013   two lowers and one upper     Current Outpatient Medications  Medication Sig Dispense Refill  . acetaminophen (TYLENOL) 500 MG tablet Take 1,000 mg by mouth every 8 (eight) hours as needed for mild pain or moderate pain.    Marland Kitchen HYDROcodone-acetaminophen (NORCO/VICODIN) 5-325 MG tablet Take 1 tablet by mouth every 6 (six) hours as needed for moderate pain. 20 tablet 0  . valACYclovir (VALTREX) 1000 MG tablet Take 1 tablet (1,000 mg  total) by mouth daily. 30 tablet 3   No current facility-administered medications for this visit.     Allergies:   Codeine; Decongestant [oxymetazoline]; Percocet [oxycodone-acetaminophen]; and Latex   Social History:  The patient  reports that she has never smoked. She has never used smokeless tobacco. She reports that she does not drink alcohol or use drugs.   Family History:  The patient's family history includes Breast cancer (age of onset: 16) in her mother; Diabetes in her maternal grandmother; Gestational diabetes in her mother; Healthy in her sister; Heart attack in her maternal grandfather; Heart disease in her father; Hyperlipidemia in her father and mother; Hypertension in her father and mother; Lung cancer in an other family member.    ROS:  Please see the history of present illness.   Otherwise, review of systems is positive for chest pain, shortness of breath, palpitations, visual changes, dizziness, passing out, headaches.   All other systems are reviewed and negative.   PHYSICAL EXAM: VS:  BP 112/74   Pulse 75   Ht 5' 4.5" (1.638 m)   Wt 148 lb (67.1 kg)   LMP 03/22/2018 (Exact Date)   SpO2 97%   BMI 25.01 kg/m  , BMI Body mass index is 25.01 kg/m. GEN: Well nourished, well developed, in no acute distress  HEENT: normal  Neck: no JVD, carotid bruits, or masses Cardiac: RRR; no murmurs, rubs, or gallops,no edema  Respiratory:  clear to auscultation bilaterally, normal work of breathing GI: soft, nontender, nondistended, + BS MS: no deformity or atrophy  Skin: warm and dry Neuro:  Strength and sensation are intact Psych: euthymic mood, full affect  EKG:  EKG is not ordered today. Personal review of the ekg ordered 03/23/18 shows sinus rhythm, rate 84  Recent Labs: 09/04/2017: ALT 12 03/23/2018: BUN 10; Creatinine, Ser 0.73; Hemoglobin 12.9; Platelets 241; Potassium 3.3; Sodium 140    Lipid Panel  No results found for: CHOL, TRIG, HDL, CHOLHDL, VLDL, LDLCALC,  LDLDIRECT   Wt Readings from Last 3 Encounters:  03/27/18 148 lb (67.1 kg)  03/23/18 145 lb (65.8 kg)  12/03/17 137 lb 3.2 oz (62.2 kg)      Other studies Reviewed: Additional studies/ records that were reviewed today include: TTE 08/11/14  Review of the above records today demonstrates: EF 61%, trace MR, TR  Monitor 07/05/15 - personally reviewed Sinus rhythm and sinus tachycardia. Symptoms of palpitations/chest pain associated with sinus rhythm.  ASSESSMENT AND PLAN:  1.  PVCs: Continue to have symptoms of palpitations.  She does not want to take beta-blockers at this time.  She did have another episode of rapid heart rates with rates in the 130s which make her feel weak and fatigued.  She had a syncopal episode.  As this was quite different from her other episodes, we Hyrum Shaneyfelt plan for a 30-day monitor.  She may benefit from antiarrhythmics or ablation in the future.   Current medicines are reviewed at length with the patient today.  The patient does not have concerns regarding her medicines.  The following changes were made today: None  Labs/ tests ordered today include:  Orders Placed This Encounter  Procedures  . Cardiac event monitor     Disposition:   FU with Nura Cahoon 1.5 months  Signed, Ananda Sitzer Jorja LoaMartin Trevonne Nyland, MD  03/27/2018 4:49 PM     [facility] [address] [place] [address] [phone] (office) [phone] (fax)

## 2018-04-14 ENCOUNTER — Ambulatory Visit (INDEPENDENT_AMBULATORY_CARE_PROVIDER_SITE_OTHER): Payer: No Typology Code available for payment source

## 2018-04-14 DIAGNOSIS — R002 Palpitations: Secondary | ICD-10-CM | POA: Diagnosis not present

## 2018-04-20 ENCOUNTER — Telehealth: Payer: Self-pay | Admitting: *Deleted

## 2018-04-20 ENCOUNTER — Telehealth: Payer: Self-pay | Admitting: Cardiology

## 2018-04-20 NOTE — Telephone Encounter (Signed)
[employer] monitoring called stating that patient had a 11 beat run of ventricular tachycardia that occurred 3:59pm on 12/5.  Unable to reach patient.

## 2018-04-20 NOTE — Telephone Encounter (Addendum)
Received Day 6 Critical report from [facility]. On 04/19/18 at 4:00 pm CST 11 beat run of VTach.   Dr. Mayford Knife was notified overnight and was unable to reach patient per previous telephone note.  Called patient.  A1ll day yesterday felt lightheaded and drained.   Sat on couch.  On and off all day randomly will get chest pains.    Reported that she did not wear for 3 days last week (Jan 1-3), was on a water park trip with her family.   Reviewed with Dr. Eden Emms.  No new orders given.  Report placed to be scanned into chart.

## 2018-05-08 ENCOUNTER — Encounter: Payer: Self-pay | Admitting: Cardiology

## 2018-05-13 DIAGNOSIS — R1032 Left lower quadrant pain: Secondary | ICD-10-CM | POA: Diagnosis not present

## 2018-05-20 ENCOUNTER — Telehealth: Payer: Self-pay

## 2018-05-20 NOTE — Telephone Encounter (Signed)
-----   Message from Will Jorja Loa, MD sent at 05/20/2018  8:59 AM EST ----- Sinus rhythm with episodes of nonsustained VT seen.  Symptoms of palpitations were associated with sinus rhythm.  Continue current management.

## 2018-05-20 NOTE — Telephone Encounter (Signed)
Pt advised her monitor results and reports that she was unaware of her 05/29/2018 OV and has requested to cancel it and have her med records left up form to her... I advised her that we need a release and will mail her the form. She says she would like to have a physician closer to her home.

## 2018-05-29 ENCOUNTER — Ambulatory Visit: Payer: No Typology Code available for payment source | Admitting: Cardiology

## 2018-06-03 ENCOUNTER — Telehealth: Payer: Self-pay | Admitting: General Practice

## 2018-06-03 DIAGNOSIS — R002 Palpitations: Secondary | ICD-10-CM

## 2018-06-03 NOTE — Telephone Encounter (Signed)
Ok to refer.  Dx palpitations

## 2018-06-03 NOTE — Telephone Encounter (Signed)
Pt would like a second opinion for cardiology. Ok to place with Dr Judithe Modest in [place].  Please advise on Dx.

## 2018-06-03 NOTE — Telephone Encounter (Signed)
Referral placed.

## 2018-08-05 ENCOUNTER — Other Ambulatory Visit: Payer: Self-pay | Admitting: Family Medicine

## 2018-09-02 DIAGNOSIS — I493 Ventricular premature depolarization: Secondary | ICD-10-CM | POA: Diagnosis not present

## 2018-09-02 DIAGNOSIS — R002 Palpitations: Secondary | ICD-10-CM | POA: Diagnosis not present

## 2018-09-02 DIAGNOSIS — I472 Ventricular tachycardia: Secondary | ICD-10-CM | POA: Diagnosis not present

## 2018-09-02 DIAGNOSIS — I4729 Other ventricular tachycardia: Secondary | ICD-10-CM | POA: Insufficient documentation

## 2018-09-09 DIAGNOSIS — I493 Ventricular premature depolarization: Secondary | ICD-10-CM | POA: Diagnosis not present

## 2018-09-09 DIAGNOSIS — I472 Ventricular tachycardia: Secondary | ICD-10-CM | POA: Diagnosis not present

## 2018-09-14 DIAGNOSIS — I472 Ventricular tachycardia: Secondary | ICD-10-CM | POA: Diagnosis not present

## 2018-09-14 DIAGNOSIS — I493 Ventricular premature depolarization: Secondary | ICD-10-CM | POA: Diagnosis not present

## 2018-09-14 DIAGNOSIS — R002 Palpitations: Secondary | ICD-10-CM | POA: Diagnosis not present

## 2018-09-14 DIAGNOSIS — I498 Other specified cardiac arrhythmias: Secondary | ICD-10-CM | POA: Diagnosis not present

## 2018-09-24 DIAGNOSIS — I493 Ventricular premature depolarization: Secondary | ICD-10-CM | POA: Diagnosis not present

## 2018-10-02 DIAGNOSIS — I493 Ventricular premature depolarization: Secondary | ICD-10-CM | POA: Diagnosis not present

## 2018-10-28 ENCOUNTER — Other Ambulatory Visit: Payer: Self-pay

## 2018-10-28 ENCOUNTER — Ambulatory Visit (INDEPENDENT_AMBULATORY_CARE_PROVIDER_SITE_OTHER): Payer: BC Managed Care – PPO | Admitting: Family Medicine

## 2018-10-28 ENCOUNTER — Encounter: Payer: Self-pay | Admitting: Family Medicine

## 2018-10-28 VITALS — Temp 98.4°F | Ht 64.5 in | Wt 147.5 lb

## 2018-10-28 DIAGNOSIS — R519 Headache, unspecified: Secondary | ICD-10-CM

## 2018-10-28 DIAGNOSIS — R51 Headache: Secondary | ICD-10-CM

## 2018-10-28 DIAGNOSIS — Z20828 Contact with and (suspected) exposure to other viral communicable diseases: Secondary | ICD-10-CM | POA: Diagnosis not present

## 2018-10-28 DIAGNOSIS — R0981 Nasal congestion: Secondary | ICD-10-CM | POA: Diagnosis not present

## 2018-10-28 DIAGNOSIS — Z7189 Other specified counseling: Secondary | ICD-10-CM

## 2018-10-28 MED ORDER — CETIRIZINE HCL 10 MG PO TABS: 10.0000 mg | ORAL_TABLET | Freq: Every day | ORAL | 1 refills | Status: DC

## 2018-10-28 MED ORDER — FLUTICASONE PROPIONATE 50 MCG/ACT NA SUSP: 2.0000 | Freq: Every day | NASAL | 0 refills | Status: DC

## 2018-10-28 NOTE — Progress Notes (Signed)
VIRTUAL VISIT VIA VIDEO  I connected with Rozann Lesches on 10/28/18 at  3:30 PM EDT by a video enabled telemedicine application and verified that I am speaking with the correct person using two identifiers. Location patient: Home Location provider: [facility], Office Persons participating in the virtual visit: Patient, Dr. Raoul Pitch and R.Baker, LPN  I discussed the limitations of evaluation and management by telemedicine and the availability of in person appointments. The patient expressed understanding and agreed to proceed.   SUBJECTIVE Chief Complaint  Patient presents with  . Nasal Congestion    x3 days. Denies being exposed to [place]. Denies fever, SOB  . Facial Pain  . Headache    HPI: Jessica Diaz is a 28 y.o. female presents to day for 3-day history of nasal congestion, bilateral intermittent maxillary sinus discomfort and frontal sinus headache.  She denies fever, chills, shortness of breath, cough, nausea, vomit or diarrhea.  She is tolerating p.o.  She has had no known COVID-19 exposure.  She has been out in the community and working during the pandemic.  She restarted her Claritin Sunday after her symptoms started and she has seen mild improvement in her symptoms.   ROS: See pertinent positives and negatives per HPI.  Patient Active Problem List   Diagnosis Date Noted  . Mastitis 10/14/2015  . Calf pain 03/31/2015  . Vacuum extraction, delivered, current hospitalization 09/21/2014  . Postpartum care following vaginal delivery (6/7) 09/20/2014  . Thrombophilia affecting pregnancy in third trimester, antepartum ([place]) 09/19/2014  . Sinusitis, acute maxillary 06/23/2014  . Bleeding in early pregnancy 02/24/2014  . Breast pain, right 07/12/2013  . Superficial phlebitis of arm 01/28/2013  . Wrist pain, right 01/27/2013  . Ureteral calculi 01/12/2013  . Pilonidal cyst 09/25/2012  . Abnormal TSH 11/22/2011  . Spider bite 11/22/2011  . Migraine 11/22/2011  .  Sore throat 09/30/2011  . HEMATURIA, HX OF 08/08/2009  . Acute sinusitis 06/28/2009  . ALLERGIC RHINITIS 06/28/2009  . DYSPNEA/SHORTNESS OF BREATH 01/11/2009  . CONTACT DERMATITIS 07/05/2008  . SYNCOPE 04/26/2008  . CARDIAC MURMUR, AORTIC 07/13/2007  . ENDOMETRIOSIS, SITE UNSPECIFIED 06/25/2007  . PALPITATIONS, HX OF 06/25/2007    Social History   Tobacco Use  . Smoking status: Never Smoker  . Smokeless tobacco: Never Used  . Tobacco comment: never used product  Substance Use Topics  . Alcohol use: No    Alcohol/week: 0.0 standard drinks    Current Outpatient Medications:  .  acetaminophen (TYLENOL) 500 MG tablet, Take 1,000 mg by mouth every 8 (eight) hours as needed for mild pain or moderate pain., Disp: , Rfl:  .  valACYclovir (VALTREX) 1000 MG tablet, TAKE 1 TABLET BY MOUTH EVERY DAY, Disp: 30 tablet, Rfl: 3 .  HYDROcodone-acetaminophen (NORCO/VICODIN) 5-325 MG tablet, Take 1 tablet by mouth every 6 (six) hours as needed for moderate pain. (Patient not taking: Reported on 10/28/2018), Disp: 20 tablet, Rfl: 0  Allergies  Allergen Reactions  . Codeine Other (See Comments)    Pt states this medication causes her bp to rise.    Armstead Peaks [Oxymetazoline] Other (See Comments)    Pt states this medication causes her bp to drop.    Marland Kitchen Percocet [Oxycodone-Acetaminophen] Other (See Comments)    hallucinations.    . Latex Rash    OBJECTIVE: Temp 98.4 F (36.9 C) (Tympanic)   Ht 5' 4.5" (1.638 m)   Wt 147 lb 8 oz (66.9 kg)   LMP 10/14/2018 (Exact Date)  BMI 24.93 kg/m  Gen: No acute distress. Nontoxic in appearance.  HENT: AT. [place].  MMM.  Eyes:Pupils Equal Round Reactive to light, Extraocular movements intact,  Conjunctiva without redness, discharge or icterus. Chest: Cough or shortness of breath not present on exam Skin: No rashes, purpura or petechiae.  Neuro:  Alert. Oriented x3    ASSESSMENT AND PLAN: [name] is a 28 y.o. female present for  Educated  About Covid-19 Virus Infection/nasal congestion/headache Discussed need for COVID-19 testing with her reported symptoms.  COVID-19 testing ordered for her today. While awaiting results treat as allergic rhinitis/early viral illness. Rest, hydrate.  May use over for the counter Tylenol/NSAIDs for comfort. Discontinue Claritin and start Zyrtec nightly-which was prescribed. Start Flonase daily, prescribed for today. Can try Mucinex with Robitussin if cough develops.  Avoiding decongestants secondary to intolerance.   COVID-19 education provided including quarantining until test results are received.  Once results received will instruct her further on plan.  She reports she does not need a work excuse today and she will not have any problems quarantining.     Felix Pacinienee Kuneff, DO 10/28/2018

## 2018-10-28 NOTE — Patient Instructions (Signed)

## 2018-10-28 NOTE — Addendum Note (Signed)
Addended by: Caroll Rancher L on: 10/28/2018 04:04 PM   Modules accepted: Orders

## 2018-10-29 ENCOUNTER — Other Ambulatory Visit: Payer: Self-pay | Admitting: Internal Medicine

## 2018-10-29 DIAGNOSIS — R6889 Other general symptoms and signs: Secondary | ICD-10-CM | POA: Diagnosis not present

## 2018-10-29 DIAGNOSIS — Z20822 Contact with and (suspected) exposure to covid-19: Secondary | ICD-10-CM

## 2018-11-03 LAB — NOVEL CORONAVIRUS, NAA: SARS-CoV-2, NAA: NOT DETECTED

## 2018-11-05 ENCOUNTER — Encounter: Payer: Self-pay | Admitting: Family Medicine

## 2018-11-30 DIAGNOSIS — I472 Ventricular tachycardia: Secondary | ICD-10-CM | POA: Diagnosis not present

## 2018-11-30 DIAGNOSIS — I493 Ventricular premature depolarization: Secondary | ICD-10-CM | POA: Diagnosis not present

## 2018-11-30 DIAGNOSIS — R002 Palpitations: Secondary | ICD-10-CM | POA: Diagnosis not present

## 2018-12-23 DIAGNOSIS — R002 Palpitations: Secondary | ICD-10-CM | POA: Diagnosis not present

## 2018-12-23 DIAGNOSIS — I493 Ventricular premature depolarization: Secondary | ICD-10-CM | POA: Diagnosis not present

## 2018-12-23 DIAGNOSIS — I472 Ventricular tachycardia: Secondary | ICD-10-CM | POA: Diagnosis not present

## 2018-12-25 DIAGNOSIS — Z20828 Contact with and (suspected) exposure to other viral communicable diseases: Secondary | ICD-10-CM | POA: Diagnosis not present

## 2018-12-25 DIAGNOSIS — I491 Atrial premature depolarization: Secondary | ICD-10-CM | POA: Diagnosis not present

## 2018-12-25 DIAGNOSIS — R002 Palpitations: Secondary | ICD-10-CM | POA: Diagnosis not present

## 2018-12-25 DIAGNOSIS — I493 Ventricular premature depolarization: Secondary | ICD-10-CM | POA: Diagnosis not present

## 2018-12-25 DIAGNOSIS — Z01812 Encounter for preprocedural laboratory examination: Secondary | ICD-10-CM | POA: Diagnosis not present

## 2018-12-25 DIAGNOSIS — I472 Ventricular tachycardia: Secondary | ICD-10-CM | POA: Diagnosis not present

## 2019-01-04 ENCOUNTER — Other Ambulatory Visit: Payer: Self-pay | Admitting: Family Medicine

## 2019-01-07 DIAGNOSIS — R002 Palpitations: Secondary | ICD-10-CM | POA: Diagnosis not present

## 2019-01-07 DIAGNOSIS — I472 Ventricular tachycardia: Secondary | ICD-10-CM | POA: Diagnosis not present

## 2019-01-07 DIAGNOSIS — Z86718 Personal history of other venous thrombosis and embolism: Secondary | ICD-10-CM | POA: Diagnosis not present

## 2019-01-07 DIAGNOSIS — R55 Syncope and collapse: Secondary | ICD-10-CM | POA: Diagnosis not present

## 2019-01-07 DIAGNOSIS — Z7901 Long term (current) use of anticoagulants: Secondary | ICD-10-CM | POA: Diagnosis not present

## 2019-01-07 DIAGNOSIS — I493 Ventricular premature depolarization: Secondary | ICD-10-CM | POA: Diagnosis not present

## 2019-01-07 DIAGNOSIS — F419 Anxiety disorder, unspecified: Secondary | ICD-10-CM | POA: Diagnosis not present

## 2019-01-07 DIAGNOSIS — I809 Phlebitis and thrombophlebitis of unspecified site: Secondary | ICD-10-CM | POA: Diagnosis not present

## 2019-01-21 DIAGNOSIS — Z95818 Presence of other cardiac implants and grafts: Secondary | ICD-10-CM | POA: Insufficient documentation

## 2019-01-21 DIAGNOSIS — I493 Ventricular premature depolarization: Secondary | ICD-10-CM | POA: Diagnosis not present

## 2019-01-27 ENCOUNTER — Ambulatory Visit (INDEPENDENT_AMBULATORY_CARE_PROVIDER_SITE_OTHER): Payer: BC Managed Care – PPO | Admitting: Family Medicine

## 2019-01-27 ENCOUNTER — Other Ambulatory Visit: Payer: Self-pay

## 2019-01-27 ENCOUNTER — Encounter: Payer: Self-pay | Admitting: Family Medicine

## 2019-01-27 VITALS — Temp 97.1°F | Ht 64.0 in | Wt 147.0 lb

## 2019-01-27 DIAGNOSIS — B9689 Other specified bacterial agents as the cause of diseases classified elsewhere: Secondary | ICD-10-CM

## 2019-01-27 DIAGNOSIS — J329 Chronic sinusitis, unspecified: Secondary | ICD-10-CM

## 2019-01-27 MED ORDER — AMOXICILLIN 875 MG PO TABS: 875.0000 mg | ORAL_TABLET | Freq: Two times a day (BID) | ORAL | 0 refills | Status: DC

## 2019-01-27 MED ORDER — FLUTICASONE PROPIONATE 50 MCG/ACT NA SUSP: 2.0000 | Freq: Every day | NASAL | 6 refills | Status: DC

## 2019-01-27 NOTE — Progress Notes (Signed)
Virtual Visit via Video   I connected with patient on 01/27/19 at  2:00 PM EDT by a video enabled telemedicine application and verified that I am speaking with the correct person using two identifiers.  Location patient: Home Location provider: [facility], Office Persons participating in the virtual visit: Patient, Provider, [place] (Jess B)  I discussed the limitations of evaluation and management by telemedicine and the availability of in person appointments. The patient expressed understanding and agreed to proceed.  Subjective:   HPI:   URI- 'I think i've just got a sinus infxn'.  + facial pressure, L ear pressure.  sxs started ~4 days ago.  No fevers.  + tooth pain.  No cough.  Feels very similar to previous infxns.  No changes to taste or smell.  No known sick contacts.  Hx of seasonal allergies- taking Claritin and Flonase (but ran out).  ROS:   See pertinent positives and negatives per HPI.  Patient Active Problem List   Diagnosis Date Noted  . Status post placement of implantable loop recorder 01/21/2019  . NSVT (nonsustained ventricular tachycardia) ([place]) 09/02/2018  . Mastitis 10/14/2015  . Calf pain 03/31/2015  . Vacuum extraction, delivered, current hospitalization 09/21/2014  . Postpartum care following vaginal delivery (6/7) 09/20/2014  . Thrombophilia affecting pregnancy in third trimester, antepartum ([place]) 09/19/2014  . Sinusitis, acute maxillary 06/23/2014  . Bleeding in early pregnancy 02/24/2014  . Breast pain, right 07/12/2013  . Superficial phlebitis of arm 01/28/2013  . Wrist pain, right 01/27/2013  . Ureteral calculi 01/12/2013  . Pilonidal cyst 09/25/2012  . Abnormal TSH 11/22/2011  . Spider bite 11/22/2011  . Migraine 11/22/2011  . Sore throat 09/30/2011  . HEMATURIA, HX OF 08/08/2009  . ALLERGIC RHINITIS 06/28/2009  . DYSPNEA/SHORTNESS OF BREATH 01/11/2009  . CONTACT DERMATITIS 07/05/2008  . SYNCOPE 04/26/2008  . CARDIAC MURMUR, AORTIC  07/13/2007  . ENDOMETRIOSIS, SITE UNSPECIFIED 06/25/2007  . PALPITATIONS, HX OF 06/25/2007    Social History   Tobacco Use  . Smoking status: Never Smoker  . Smokeless tobacco: Never Used  . Tobacco comment: never used product  Substance Use Topics  . Alcohol use: No    Alcohol/week: 0.0 standard drinks    Current Outpatient Medications:  .  acetaminophen (TYLENOL) 500 MG tablet, Take 1,000 mg by mouth every 8 (eight) hours as needed for mild pain or moderate pain., Disp: , Rfl:  .  fluticasone (FLONASE) 50 MCG/ACT nasal spray, Place 2 sprays into both nostrils daily., Disp: 16 g, Rfl: 0 .  loratadine (CLARITIN) 10 MG tablet, Take by mouth., Disp: , Rfl:  .  valACYclovir (VALTREX) 1000 MG tablet, TAKE 1 TABLET BY MOUTH EVERY DAY, Disp: 30 tablet, Rfl: 3  Allergies  Allergen Reactions  . Codeine Other (See Comments)    Pt states this medication causes her bp to rise.    Armstead Peaks [Oxymetazoline] Other (See Comments)    Pt states this medication causes her bp to drop.    Marland Kitchen Percocet [Oxycodone-Acetaminophen] Other (See Comments)    hallucinations.    . Latex Rash    Objective:   Temp (!) 97.1 F (36.2 C) (Tympanic)   Ht 5\' 4"  (1.626 m)   Wt 147 lb (66.7 kg)   BMI 25.23 kg/m   AAOx3, NAD NCAT, EOMI No obvious CN deficits Coloring WNL Pt is able to speak clearly, coherently without shortness of breath or increased work of breathing.  Thought process is linear.  Mood is appropriate.  Assessment and Plan:   Bacterial sinusitis- new.  Pt's sxs are consistent w/ previous infxn.  Does not have any red flags for COVID.  Discussed possibility of COVID infxn and that testing will be needed if sxs don't improve w/ abx.  Reviewed supportive care and red flags that should prompt return.  Pt expressed understanding and is in agreement w/ plan.    [name], MD 01/27/2019

## 2019-01-27 NOTE — Progress Notes (Signed)
I have discussed the procedure for the virtual visit with the patient who has given consent to proceed with assessment and treatment.   Alessia Gonsalez L Aubrielle Stroud, CMA     

## 2019-02-10 DIAGNOSIS — R55 Syncope and collapse: Secondary | ICD-10-CM | POA: Diagnosis not present

## 2019-02-10 DIAGNOSIS — R002 Palpitations: Secondary | ICD-10-CM | POA: Diagnosis not present

## 2019-02-10 DIAGNOSIS — Z95818 Presence of other cardiac implants and grafts: Secondary | ICD-10-CM | POA: Diagnosis not present

## 2019-04-07 DIAGNOSIS — R002 Palpitations: Secondary | ICD-10-CM | POA: Diagnosis not present

## 2019-05-11 ENCOUNTER — Other Ambulatory Visit: Payer: Self-pay | Admitting: Family Medicine

## 2019-07-06 DIAGNOSIS — Z9889 Other specified postprocedural states: Secondary | ICD-10-CM | POA: Diagnosis not present

## 2019-07-14 DIAGNOSIS — R002 Palpitations: Secondary | ICD-10-CM | POA: Diagnosis not present

## 2019-07-14 DIAGNOSIS — R0789 Other chest pain: Secondary | ICD-10-CM | POA: Diagnosis not present

## 2019-11-04 DIAGNOSIS — R002 Palpitations: Secondary | ICD-10-CM | POA: Diagnosis not present

## 2019-11-04 DIAGNOSIS — Z4509 Encounter for adjustment and management of other cardiac device: Secondary | ICD-10-CM | POA: Diagnosis not present

## 2019-11-17 ENCOUNTER — Other Ambulatory Visit: Payer: Self-pay | Admitting: Family Medicine

## 2019-12-24 DIAGNOSIS — H43391 Other vitreous opacities, right eye: Secondary | ICD-10-CM | POA: Diagnosis not present

## 2019-12-24 DIAGNOSIS — H43823 Vitreomacular adhesion, bilateral: Secondary | ICD-10-CM | POA: Diagnosis not present

## 2020-01-18 DIAGNOSIS — H43391 Other vitreous opacities, right eye: Secondary | ICD-10-CM | POA: Diagnosis not present

## 2020-02-05 DIAGNOSIS — R002 Palpitations: Secondary | ICD-10-CM | POA: Diagnosis not present

## 2020-02-05 DIAGNOSIS — Z4509 Encounter for adjustment and management of other cardiac device: Secondary | ICD-10-CM | POA: Diagnosis not present

## 2020-03-07 DIAGNOSIS — R051 Acute cough: Secondary | ICD-10-CM | POA: Diagnosis not present

## 2020-03-07 DIAGNOSIS — J209 Acute bronchitis, unspecified: Secondary | ICD-10-CM | POA: Diagnosis not present

## 2020-03-08 DIAGNOSIS — R55 Syncope and collapse: Secondary | ICD-10-CM | POA: Diagnosis not present

## 2020-03-08 DIAGNOSIS — I493 Ventricular premature depolarization: Secondary | ICD-10-CM | POA: Diagnosis not present

## 2020-03-08 DIAGNOSIS — G4700 Insomnia, unspecified: Secondary | ICD-10-CM | POA: Diagnosis not present

## 2020-03-21 ENCOUNTER — Other Ambulatory Visit: Payer: Self-pay | Admitting: Family Medicine

## 2020-04-01 DIAGNOSIS — Z20822 Contact with and (suspected) exposure to covid-19: Secondary | ICD-10-CM | POA: Diagnosis not present

## 2020-04-01 DIAGNOSIS — U071 COVID-19: Secondary | ICD-10-CM | POA: Diagnosis not present

## 2020-05-08 DIAGNOSIS — R002 Palpitations: Secondary | ICD-10-CM | POA: Diagnosis not present

## 2020-05-16 ENCOUNTER — Encounter: Payer: Self-pay | Admitting: Family Medicine

## 2020-05-16 ENCOUNTER — Telehealth (INDEPENDENT_AMBULATORY_CARE_PROVIDER_SITE_OTHER): Payer: BC Managed Care – PPO | Admitting: Family Medicine

## 2020-05-16 DIAGNOSIS — R590 Localized enlarged lymph nodes: Secondary | ICD-10-CM | POA: Diagnosis not present

## 2020-05-16 DIAGNOSIS — B9689 Other specified bacterial agents as the cause of diseases classified elsewhere: Secondary | ICD-10-CM

## 2020-05-16 DIAGNOSIS — J329 Chronic sinusitis, unspecified: Secondary | ICD-10-CM

## 2020-05-16 MED ORDER — AMOXICILLIN 875 MG PO TABS: 875.0000 mg | ORAL_TABLET | Freq: Two times a day (BID) | ORAL | 0 refills | Status: DC

## 2020-05-16 NOTE — Progress Notes (Signed)
I connected with  Jessica Diaz on 05/16/20 by a video enabled telemedicine application and verified that I am speaking with the correct person using two identifiers.   I discussed the limitations of evaluation and management by telemedicine. The patient expressed understanding and agreed to proceed.

## 2020-05-16 NOTE — Progress Notes (Signed)
Virtual Visit via Video   I connected with patient on 05/16/20 at  9:30 AM EST by a video enabled telemedicine application and verified that I am speaking with the correct person using two identifiers.  Location patient: Home Location provider: [facility], Office Persons participating in the virtual visit: Patient, Provider, CMA Martie Lee)  I discussed the limitations of evaluation and management by telemedicine and the availability of in person appointments. The patient expressed understanding and agreed to proceed.  Subjective:   HPI:   URI- pt had COVID in December.  On Thursday developed PND.  Had leftover Amoxicillin (3 days) which improved sinus pressure.  Throat continues to be red and sore.  No fevers, chills, cough.  + maxillary sinus pain/pressure.  L sided tooth pain.  Pt reports she will get swollen R axillary LNs w/ any sore of respiratory sx since having COVID.   ROS:   See pertinent positives and negatives per HPI.  Patient Active Problem List   Diagnosis Date Noted  . Status post placement of implantable loop recorder 01/21/2019  . NSVT (nonsustained ventricular tachycardia) ([facility]) 09/02/2018  . Mastitis 10/14/2015  . Calf pain 03/31/2015  . Vacuum extraction, delivered, current hospitalization 09/21/2014  . Postpartum care following vaginal delivery (6/7) 09/20/2014  . 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 Sinusitis, acute maxillary 06/23/2014  . Bleeding in early pregnancy 02/24/2014  . Breast pain, right 07/12/2013  . Superficial phlebitis of arm 01/28/2013  . Wrist pain, right 01/27/2013  . Ureteral calculi 01/12/2013  . Pilonidal cyst 09/25/2012  . Abnormal TSH 11/22/2011  . Spider bite 11/22/2011  . Migraine 11/22/2011  . Sore throat 09/30/2011  . HEMATURIA, HX OF 08/08/2009  . ALLERGIC RHINITIS 06/28/2009  . DYSPNEA/SHORTNESS OF BREATH 01/11/2009  . CONTACT DERMATITIS 07/05/2008  . SYNCOPE  04/26/2008  . CARDIAC MURMUR, AORTIC 07/13/2007  . ENDOMETRIOSIS, SITE UNSPECIFIED 06/25/2007  . PALPITATIONS, HX OF 06/25/2007    Social History   Tobacco Use  . Smoking status: Never Smoker  . Smokeless tobacco: Never Used  . Tobacco comment: never used product  Substance Use Topics  . Alcohol use: No    Alcohol/week: 0.0 standard drinks    Current Outpatient Medications:  .  acetaminophen (TYLENOL) 500 MG tablet, Take 1,000 mg by mouth every 8 (eight) hours as needed for mild pain or moderate pain., Disp: , Rfl:  .  amoxicillin (AMOXIL) 875 MG tablet, Take 1 tablet (875 mg total) by mouth 2 (two) times daily., Disp: 20 tablet, Rfl: 0 .  fluticasone (FLONASE) 50 MCG/ACT nasal spray, USE 2 SPRAYS IN EACH NOSTRIL ONCE DAILY., Disp: 16 g, Rfl: 6 .  loratadine (CLARITIN) 10 MG tablet, Take by mouth., Disp: , Rfl:  .  valACYclovir (VALTREX) 1000 MG tablet, TAKE 1 TABLET BY MOUTH EVERY DAY, Disp: 90 tablet, Rfl: 1  Allergies  Allergen Reactions  . Codeine Other (See Comments)    Pt states this medication causes her bp to rise.    Leilani Merl [Oxymetazoline] Other (See Comments)    Pt states this medication causes her bp to drop.    Marland Kitchen Percocet [Oxycodone-Acetaminophen] Other (See Comments)    hallucinations.    . Latex Rash    Objective:   There were no vitals taken for this visit. AAOx3, NAD NCAT, EOMI No obvious CN deficits Coloring WNL + TTP over maxillary sinuses Pt is able to speak clearly, coherently without shortness of breath or increased work of breathing.  Thought process is linear.  Mood is appropriate.   Assessment and Plan:   Bacterial sinusitis- pt has hx of sinus infections and reports this feels similar.  Sxs were improving w/ Amoxicillin but she ran out of her leftover meds and sxs came back.  Encouraged daily antihistamine.  Will complete course of abx.  Reviewed supportive care and red flags that should prompt return.  Pt expressed understanding and  is in agreement w/ plan.   Axillary lymphadenopathy- new.  Pt reports that since COVID she will develop painful armpit swelling with any type of respiratory sxs.  Discussed reactive LAD and that COVID may have overwhelmed and confused her system.  If sxs persist over the next weeks to months, will get [place] to assess.  Pt expressed understanding and is in agreement w/ plan.    [name], MD 05/16/2020

## 2020-05-29 ENCOUNTER — Other Ambulatory Visit: Payer: Self-pay

## 2020-05-29 DIAGNOSIS — B009 Herpesviral infection, unspecified: Secondary | ICD-10-CM

## 2020-05-29 MED ORDER — VALACYCLOVIR HCL 1 G PO TABS: 1000.0000 mg | ORAL_TABLET | Freq: Every day | ORAL | 0 refills | Status: DC

## 2020-06-20 DIAGNOSIS — L700 Acne vulgaris: Secondary | ICD-10-CM | POA: Diagnosis not present

## 2020-06-20 DIAGNOSIS — L728 Other follicular cysts of the skin and subcutaneous tissue: Secondary | ICD-10-CM | POA: Diagnosis not present

## 2020-06-20 DIAGNOSIS — I781 Nevus, non-neoplastic: Secondary | ICD-10-CM | POA: Diagnosis not present

## 2020-07-12 ENCOUNTER — Emergency Department (HOSPITAL_BASED_OUTPATIENT_CLINIC_OR_DEPARTMENT_OTHER)
Admission: EM | Admit: 2020-07-12 | Discharge: 2020-07-12 | Disposition: A | Discharge status: Home / Self Care | Payer: BC Managed Care – PPO | Attending: Emergency Medicine | Admitting: Emergency Medicine

## 2020-07-12 ENCOUNTER — Encounter: Payer: Self-pay | Admitting: Family Medicine

## 2020-07-12 ENCOUNTER — Ambulatory Visit (INDEPENDENT_AMBULATORY_CARE_PROVIDER_SITE_OTHER): Payer: BC Managed Care – PPO | Admitting: Family Medicine

## 2020-07-12 ENCOUNTER — Other Ambulatory Visit: Payer: Self-pay

## 2020-07-12 ENCOUNTER — Emergency Department (HOSPITAL_BASED_OUTPATIENT_CLINIC_OR_DEPARTMENT_OTHER): Payer: BC Managed Care – PPO

## 2020-07-12 ENCOUNTER — Encounter (HOSPITAL_BASED_OUTPATIENT_CLINIC_OR_DEPARTMENT_OTHER): Payer: Self-pay | Admitting: Emergency Medicine

## 2020-07-12 DIAGNOSIS — N132 Hydronephrosis with renal and ureteral calculous obstruction: Secondary | ICD-10-CM | POA: Diagnosis not present

## 2020-07-12 DIAGNOSIS — Z91040 Latex allergy status: Secondary | ICD-10-CM | POA: Diagnosis not present

## 2020-07-12 DIAGNOSIS — N200 Calculus of kidney: Secondary | ICD-10-CM | POA: Insufficient documentation

## 2020-07-12 DIAGNOSIS — N201 Calculus of ureter: Secondary | ICD-10-CM

## 2020-07-12 DIAGNOSIS — R109 Unspecified abdominal pain: Secondary | ICD-10-CM | POA: Diagnosis not present

## 2020-07-12 LAB — COMPREHENSIVE METABOLIC PANEL
ALT: 32 U/L (ref 0–44)
AST: 25 U/L (ref 15–41)
Albumin: 4.3 g/dL (ref 3.5–5.0)
Alkaline Phosphatase: 40 U/L (ref 38–126)
Anion gap: 9 (ref 5–15)
BUN: 15 mg/dL (ref 6–20)
CO2: 24 mmol/L (ref 22–32)
Calcium: 8.8 mg/dL — ABNORMAL LOW (ref 8.9–10.3)
Chloride: 106 mmol/L (ref 98–111)
Creatinine, Ser: 0.79 mg/dL (ref 0.44–1.00)
GFR, Estimated: >60 mL/min (ref >60)
Glucose, Bld: 128 mg/dL — ABNORMAL HIGH (ref 70–99)
Potassium: 3.6 mmol/L (ref 3.5–5.1)
Sodium: 139 mmol/L (ref 135–145)
Total Bilirubin: 0.4 mg/dL (ref 0.3–1.2)
Total Protein: 6.7 g/dL (ref 6.5–8.1)

## 2020-07-12 LAB — CBC WITH DIFFERENTIAL/PLATELET
Abs Immature Granulocytes: 0.07 10*3/uL (ref 0.00–0.07)
Basophils Absolute: 0 10*3/uL (ref 0.0–0.1)
Basophils Relative: 0 %
Eosinophils Absolute: 0 10*3/uL (ref 0.0–0.5)
Eosinophils Relative: 0 %
HCT: 39.8 % (ref 36.0–46.0)
Hemoglobin: 13.4 g/dL (ref 12.0–15.0)
Immature Granulocytes: 1 %
Lymphocytes Relative: 9 %
Lymphs Abs: 1.3 10*3/uL (ref 0.7–4.0)
MCH: 30.5 pg (ref 26.0–34.0)
MCHC: 33.7 g/dL (ref 30.0–36.0)
MCV: 90.7 fL (ref 80.0–100.0)
Monocytes Absolute: 0.4 10*3/uL (ref 0.1–1.0)
Monocytes Relative: 3 %
Neutro Abs: 12.5 10*3/uL — ABNORMAL HIGH (ref 1.7–7.7)
Neutrophils Relative %: 87 %
Platelets: 265 10*3/uL (ref 150–400)
RBC: 4.39 MIL/uL (ref 3.87–5.11)
RDW: 12.5 % (ref 11.5–15.5)
WBC: 14.5 10*3/uL — ABNORMAL HIGH (ref 4.0–10.5)
nRBC: 0 % (ref 0.0–0.2)

## 2020-07-12 LAB — POCT URINALYSIS DIPSTICK
Bilirubin, UA: 1
Blood, UA: NEGATIVE
Glucose, UA: NEGATIVE
Ketones, UA: 16
Leukocytes, UA: NEGATIVE
Nitrite, UA: NEGATIVE
Protein, UA: POSITIVE — AB
Spec Grav, UA: >=1.03 — AB (ref 1.010–1.025)
Urobilinogen, UA: 0.2 E.U./dL
pH, UA: 5 (ref 5.0–8.0)

## 2020-07-12 LAB — LIPASE, BLOOD: Lipase: 11 U/L (ref 11–51)

## 2020-07-12 LAB — PREGNANCY, URINE: Preg Test, Ur: NEGATIVE

## 2020-07-12 MED ORDER — MORPHINE SULFATE (PF) 4 MG/ML IV SOLN
INTRAVENOUS | Status: AC
  Administered 2020-07-12: 4 mg via INTRAVENOUS
  Filled 2020-07-12: qty 1

## 2020-07-12 MED ORDER — ONDANSETRON 4 MG PO TBDP: ORAL_TABLET | ORAL | 0 refills | Status: DC

## 2020-07-12 MED ORDER — MORPHINE SULFATE (PF) 4 MG/ML IV SOLN: 4.0000 mg | Freq: Once | INTRAVENOUS | Status: AC

## 2020-07-12 MED ORDER — PROMETHAZINE HCL 25 MG/ML IJ SOLN
25.0000 mg | Freq: Four times a day (QID) | INTRAMUSCULAR | Status: DC | PRN
  Administered 2020-07-12: 25 mg via INTRAMUSCULAR

## 2020-07-12 MED ORDER — TAMSULOSIN HCL 0.4 MG PO CAPS: 0.4000 mg | ORAL_CAPSULE | Freq: Every day | ORAL | 0 refills | Status: DC

## 2020-07-12 MED ORDER — MORPHINE SULFATE 15 MG PO TABS: 7.5000 mg | ORAL_TABLET | ORAL | 0 refills | Status: DC | PRN

## 2020-07-12 MED ORDER — MORPHINE SULFATE (PF) 4 MG/ML IV SOLN
4.0000 mg | Freq: Once | INTRAVENOUS | Status: AC
  Administered 2020-07-12: 4 mg via INTRAVENOUS
  Filled 2020-07-12: qty 1

## 2020-07-12 MED ORDER — ONDANSETRON HCL 4 MG/2ML IJ SOLN
4.0000 mg | Freq: Once | INTRAMUSCULAR | Status: AC
  Administered 2020-07-12: 4 mg via INTRAVENOUS
  Filled 2020-07-12: qty 2

## 2020-07-12 MED ORDER — SODIUM CHLORIDE 0.9 % IV BOLUS
1000.0000 mL | Freq: Once | INTRAVENOUS | Status: AC
  Administered 2020-07-12: 1000 mL via INTRAVENOUS

## 2020-07-12 MED ORDER — KETOROLAC TROMETHAMINE 15 MG/ML IJ SOLN
15.0000 mg | Freq: Once | INTRAMUSCULAR | Status: AC
  Administered 2020-07-12: 15 mg via INTRAVENOUS
  Filled 2020-07-12: qty 1

## 2020-07-12 NOTE — Progress Notes (Signed)
Patient presents to office in severe pain. Patient stated that she has kidney stones before and think they have returned. Patient was injected with 25mg  of promethazine and tolerated well. Patient will be further evaluated at the ER.

## 2020-07-12 NOTE — Progress Notes (Signed)
   Subjective:    Patient ID: Jessica Diaz, female    DOB: May 08, 1990, 30 y.o.   MRN: [id number]  HPI 'I think I have a kidney stone'- pt has hx of similar, both during pregnancy and another episode that required surgical intervention.  sxs started this morning- immediate severe pain.  No blood in urine.  Pain is R sided.  Has not taken anything for pain.  + nausea- vomited upon arrival.  Pt was unaware of any residual stones.  Pain is constant rather than coming in waves.  Pain is R flank and lower abdomen.   Review of Systems For ROS see HPI   This visit occurred during the SARS-CoV-2 public health emergency.  Safety protocols were in place, including screening questions prior to the visit, additional usage of staff PPE, and extensive cleaning of exam room while observing appropriate contact time as indicated for disinfecting solutions.       Objective:   Physical Exam Vitals reviewed.  Constitutional:      General: She is in acute distress.     Appearance: She is ill-appearing.     Comments: Pt in severe pain- brought in by wheelchair.  Husband having to hold her up.  She is writhing, unable to sit still for vitals, keeps saying she is going to pass out  HENT:     Head: Normocephalic and atraumatic.  Skin:    General: Skin is warm and dry.     Coloration: Skin is pale.  Neurological:     Mental Status: She is oriented to person, place, and time.           Assessment & Plan:  Kidney stone- pt is clearly in significant and severe pain.  She is unable to sit still for vitals, she is writhing in pain while crying out, she is pale.  She vomited upon arrival and keeps saying she is going to pass out.  She was given 25mg  IM Phenergan here in the office and sent to the ER.  I offered to call EMS multiple times but husband declined.  He was given directions to the new and he and pt left by personal vehicle.

## 2020-07-12 NOTE — ED Provider Notes (Signed)
[facility] EMERGENCY DEPT Provider Note   CSN: [id number] Arrival date & time: 07/12/20  1059     History Chief Complaint  Patient presents with  . Flank Pain    Jessica Diaz is a 30 y.o. female.  30 yo F with a cc of R flank pain.  Started this morning worsening throughout the day.  She went to her family doctor's office and had an episode of emesis and provided a urine sample that was negative for infection.  Felt like her prior kidney stones that she had had in the past.  Described as sharp and severe.  Radiating from the right flank down to the right groin.  She denies fevers or chills.  Has had to have multiple procedures done to have her stones removed in the past.  The history is provided by the patient and the spouse.  Flank Pain This is a new problem. The current episode started 3 to 5 hours ago. The problem occurs constantly. The problem has not changed since onset.Pertinent negatives include no chest pain, no headaches and no shortness of breath. Nothing aggravates the symptoms. Nothing relieves the symptoms. She has tried nothing for the symptoms. The treatment provided no relief.       Past Medical History:  Diagnosis Date  . Arrhythmia    non sustained VT needs cardiac monitoring in labor  . Bleeding in early pregnancy 02/24/2014  . Endometriosis 2008   lap endometriosis fulgration- 2008  . Family history of anesthesia complication    mother and father have N/V  . Hemorrhoids   . History of herpetic whitlow 09/2016  . History of kidney stones 2014   Sx: cystoscopy w/retrograde pyelogram, ureteroscopy and stent placement  . History of pilonidal cyst   . Hx of left knee surgery 2007  . Hx of varicella   . Hx of wisdom tooth extraction 2013  . Intermittent palpitations   . Migraine   . Nonsustained ventricular tachycardia (HCC)   . Ovarian cyst    Left  . Personal history of DVT (deep vein thrombosis) 2014   blood clot in right arm post  renal stone surgery 01/12/13-on blood thinnners (Lovenox)  . PONV (postoperative nausea and vomiting)    did not have N/V after cysto  . Postpartum care following vaginal delivery (6/7) 09/20/2014  . Subchorionic hemorrhage, antepartum 2016  . Syncope 2013  . UTI (lower urinary tract infection)     Patient Active Problem List   Diagnosis Date Noted  . Status post placement of implantable loop recorder 01/21/2019  . NSVT (nonsustained ventricular tachycardia) (HCC) 09/02/2018  . Mastitis 10/14/2015  . Calf pain 03/31/2015  . Vacuum extraction, delivered, current hospitalization 09/21/2014  . Postpartum care following vaginal delivery (6/7) 09/20/2014  . 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 Sinusitis, acute maxillary 06/23/2014  . Bleeding in early pregnancy 02/24/2014  . Breast pain, right 07/12/2013  . Superficial phlebitis of arm 01/28/2013  . Wrist pain, right 01/27/2013  . Ureteral calculi 01/12/2013  . Pilonidal cyst 09/25/2012  . Abnormal TSH 11/22/2011  . Spider bite 11/22/2011  . Migraine 11/22/2011  . Sore throat 09/30/2011  . HEMATURIA, HX OF 08/08/2009  . ALLERGIC RHINITIS 06/28/2009  . DYSPNEA/SHORTNESS OF BREATH 01/11/2009  . CONTACT DERMATITIS 07/05/2008  . SYNCOPE 04/26/2008  . CARDIAC MURMUR, AORTIC 07/13/2007  . ENDOMETRIOSIS, SITE UNSPECIFIED 06/25/2007  . PALPITATIONS, HX OF 06/25/2007    Past Surgical History:  Procedure Laterality Date  .  ABLATION ON ENDOMETRIOSIS  10/24/2017   Procedure: ABLATION ON ENDOMETRIOSIS;  Surgeon: Olivia Mackie, MD;  Location: [facility];  Service: Gynecology;;  . Bluford Kaufmann WITH RETROGRADE PYELOGRAM, URETEROSCOPY AND STENT PLACEMENT Right 01/12/2013   Procedure: CYSTOSCOPY WITH RETROGRADE PYELOGRAM, URETEROSCOPY AND STENT PLACEMENT;  Surgeon: Valetta Fuller, MD;  Location: [facility];  Service: Urology;  Laterality: Right;  with stone basket extraction  . KNEE SURGERY Left 2007  .  LAPAROSCOPIC ENDOMETRIOSIS FULGURATION  2008  . LAPAROSCOPY  10/24/2017   Procedure: LAPAROSCOPY DIAGNOSTIC;  Surgeon: Olivia Mackie, MD;  Location: [facility];  Service: Gynecology;;  . WISDOM TOOTH EXTRACTION  2013   two lowers and one upper     OB History    Gravida  2   Para  2   Term  2   Preterm      AB      Living  2     SAB      IAB      Ectopic      Multiple  0   Live Births  2           Family History  Problem Relation Age of Onset  . Heart attack Maternal Grandfather   . Breast cancer Mother 26  . Gestational diabetes Mother   . Hypertension Mother   . Hyperlipidemia Mother   . Heart disease Father        SBE, aorta replacement @ age 34  . Hypertension Father   . Hyperlipidemia Father   . Lung cancer Other        paternal side  . Diabetes Maternal Grandmother   . Healthy Sister        x3    Social History   Tobacco Use  . Smoking status: Never Smoker  . Smokeless tobacco: Never Used  . Tobacco comment: never used product  Vaping Use  . Vaping Use: Never used  Substance Use Topics  . Alcohol use: No    Alcohol/week: 0.0 standard drinks  . Drug use: No    Home Medications Prior to Admission medications   Medication Sig Start Date End Date Taking? Authorizing Provider  loratadine (CLARITIN) 10 MG tablet Take by mouth.   Yes [provider]  morphine (MSIR) 15 MG tablet Take 0.5 tablets (7.5 mg total) by mouth every 4 (four) hours as needed for severe pain. 07/12/20  Yes Melene Plan, DO  ondansetron (ZOFRAN ODT) 4 MG disintegrating tablet 4mg  ODT q4 hours prn nausea/vomit 07/12/20  Yes 07/14/20, DO  tamsulosin (FLOMAX) 0.4 MG CAPS capsule Take 1 capsule (0.4 mg total) by mouth daily after supper. 07/12/20  Yes 07/14/20, DO  valACYclovir (VALTREX) 1000 MG tablet Take 1 tablet (1,000 mg total) by mouth daily. 05/29/20  Yes 05/31/20, MD  acetaminophen (TYLENOL) 500 MG tablet Take 1,000 mg by mouth every 8 (eight) hours as  needed for mild pain or moderate pain.    [provider]  amoxicillin (AMOXIL) 875 MG tablet Take 1 tablet (875 mg total) by mouth 2 (two) times daily. 05/16/20   07/14/20, MD  fluticasone (FLONASE) 50 MCG/ACT nasal spray USE 2 SPRAYS IN EACH NOSTRIL ONCE DAILY. 03/21/20   14/7/21, MD    Allergies    Codeine, Decongestant [oxymetazoline], Percocet [oxycodone-acetaminophen], and Latex  Review of Systems   Review of Systems  Constitutional: Negative for chills and fever.  HENT: Negative for congestion and rhinorrhea.   Eyes: Negative  for redness and visual disturbance.  Respiratory: Negative for shortness of breath and wheezing.   Cardiovascular: Negative for chest pain and palpitations.  Gastrointestinal: Positive for nausea and vomiting.  Genitourinary: Positive for flank pain. Negative for dysuria and urgency.  Musculoskeletal: Negative for arthralgias and myalgias.  Skin: Negative for pallor and wound.  Neurological: Negative for dizziness and headaches.    Physical Exam Updated Vital Signs BP 127/80   Pulse 73   Temp (!) 97.4 F (36.3 C) (Oral)   Resp 14   Ht 5\' 4"  (1.626 m)   Wt 65.8 kg   LMP  (Approximate)   SpO2 97%   BMI 24.89 kg/m   Physical Exam Vitals and nursing note reviewed.  Constitutional:      General: She is not in acute distress.    Appearance: She is well-developed. She is not diaphoretic.     Comments: Patient appears uncomfortable  HENT:     Head: Normocephalic and atraumatic.  Eyes:     Pupils: Pupils are equal, round, and reactive to light.  Cardiovascular:     Rate and Rhythm: Normal rate and regular rhythm.     Heart sounds: No murmur heard. No friction rub. No gallop.   Pulmonary:     Effort: Pulmonary effort is normal.     Breath sounds: No wheezing or rales.  Abdominal:     General: There is no distension.     Palpations: Abdomen is soft.     Tenderness: There is abdominal tenderness.     Comments: Mild  diffuse abdominal tenderness that slightly worse on the right than the left.  Musculoskeletal:        General: No tenderness.     Cervical back: Normal range of motion and neck supple.  Skin:    General: Skin is warm and dry.  Neurological:     Mental Status: She is alert and oriented to person, place, and time.  Psychiatric:        Behavior: Behavior normal.     ED Results / Procedures / Treatments   Labs (all labs ordered are listed, but only abnormal results are displayed) Labs Reviewed  CBC WITH DIFFERENTIAL/PLATELET - Abnormal; Notable for the following components:      Result Value   WBC 14.5 (*)    Neutro Abs 12.5 (*)    All other components within normal limits  COMPREHENSIVE METABOLIC PANEL - Abnormal; Notable for the following components:   Glucose, Bld 128 (*)    Calcium 8.8 (*)    All other components within normal limits  PREGNANCY, URINE  LIPASE, BLOOD    EKG None  Radiology CT Renal Stone Study  Result Date: 07/12/2020 CLINICAL DATA:  Right-sided flank pain, history of kidney stones EXAM: CT ABDOMEN AND PELVIS WITHOUT CONTRAST TECHNIQUE: Multidetector CT imaging of the abdomen and pelvis was performed following the standard protocol without IV contrast. COMPARISON:  2015 FINDINGS: Lower chest: No acute abnormality. Hepatobiliary: No focal liver abnormality is seen. No gallstones, gallbladder wall thickening, or biliary dilatation. Pancreas: Unremarkable. Spleen: Unremarkable. Adrenals/Urinary Tract: There is a 2 mm obstructing calculus at the right ureterovesical junction. Right hydroureteronephrosis is present. There is right perinephric stranding. Cluster of nonobstructing renal calculi at the upper pole of the right kidney with the largest measuring 3 mm. Left kidney is unremarkable. Bladder is poorly distended. Adrenals are unremarkable. Stomach/Bowel: Stomach is within normal limits. Bowel is normal in caliber. Normal appendix caliber with some retained  hyperdense material. Vascular/Lymphatic:  No significant vascular findings on this noncontrast study. No enlarged lymph nodes. Reproductive: Uterus and bilateral adnexa are unremarkable. Other: Trace free fluid in the pelvis. Abdominal wall is unremarkable. Musculoskeletal: No acute osseous abnormality. IMPRESSION: 2 mm obstructing calculus at the right ureterovesical junction with hydroureteronephrosis. Nonobstructing small right upper pole renal calculi. Electronically Signed   By: Guadlupe Spanish M.D.   On: 07/12/2020 13:24    Procedures Procedures   Medications Ordered in ED Medications  morphine 4 MG/ML injection 4 mg (4 mg Intravenous Given 07/12/20 1119)  ondansetron (ZOFRAN) injection 4 mg (4 mg Intravenous Given 07/12/20 1118)  sodium chloride 0.9 % bolus 1,000 mL (0 mLs Intravenous Stopped 07/12/20 1248)  morphine 4 MG/ML injection 4 mg (4 mg Intravenous Given 07/12/20 1152)  ondansetron (ZOFRAN) injection 4 mg (4 mg Intravenous Given 07/12/20 1152)  morphine 4 MG/ML injection 4 mg (4 mg Intravenous Given 07/12/20 1229)  ketorolac (TORADOL) 15 MG/ML injection 15 mg (15 mg Intravenous Given 07/12/20 1350)  morphine 4 MG/ML injection 4 mg (4 mg Intravenous Given 07/12/20 1353)    ED Course  I have reviewed the triage vital signs and the nursing notes.  Pertinent labs & imaging results that were available during my care of the patient were reviewed by me and considered in my medical decision making (see chart for details).    MDM Rules/Calculators/A&P                          30 yo F with a chief complaints of right flank pain.  Patient has a history of kidney stones in the past and thinks this feels similar.  No fevers or chills.  Had a UA provided at her physician's office that I am able to review there is no signs of infection.  Negative nitrites and leukocyte esterase.  Will obtain a laboratory evaluation treat pain and nausea as the patient has had multiple procedures in the past will  obtain CT imaging.  CT scan with 2 mm UVJ stone on the right.  Patient given Toradol.  On reassessment the patient feels much better.  Will discharge the patient home.  Urology follow-up.  2:36 PM:  I have discussed the diagnosis/risks/treatment options with the patient and believe the pt to be eligible for discharge home to follow-up with Urology. We also discussed returning to the ED immediately if new or worsening sx occur. We discussed the sx which are most concerning (e.g., sudden worsening pain, fever, inability to tolerate by mouth) that necessitate immediate return. Medications administered to the patient during their visit and any new prescriptions provided to the patient are listed below.  Medications given during this visit Medications  morphine 4 MG/ML injection 4 mg (4 mg Intravenous Given 07/12/20 1119)  ondansetron (ZOFRAN) injection 4 mg (4 mg Intravenous Given 07/12/20 1118)  sodium chloride 0.9 % bolus 1,000 mL (0 mLs Intravenous Stopped 07/12/20 1248)  morphine 4 MG/ML injection 4 mg (4 mg Intravenous Given 07/12/20 1152)  ondansetron (ZOFRAN) injection 4 mg (4 mg Intravenous Given 07/12/20 1152)  morphine 4 MG/ML injection 4 mg (4 mg Intravenous Given 07/12/20 1229)  ketorolac (TORADOL) 15 MG/ML injection 15 mg (15 mg Intravenous Given 07/12/20 1350)  morphine 4 MG/ML injection 4 mg (4 mg Intravenous Given 07/12/20 1353)     The patient appears reasonably screen and/or stabilized for discharge and I doubt any other medical condition or other [facility] requiring further screening, evaluation, or treatment in  the ED at this time prior to discharge.   Final Clinical Impression(s) / ED Diagnoses Final diagnoses:  Nephrolithiasis    Rx / DC Orders ED Discharge Orders         Ordered    morphine (MSIR) 15 MG tablet  Every 4 hours PRN        07/12/20 1435    ondansetron (ZOFRAN ODT) 4 MG disintegrating tablet        07/12/20 1435    tamsulosin (FLOMAX) 0.4 MG CAPS capsule  Daily after  supper        07/12/20 1435           Melene PlanFloyd, Talita Recht, DO 07/12/20 1436

## 2020-07-12 NOTE — Discharge Instructions (Signed)
Take 4 over the counter ibuprofen tablets 3 times a day or 2 over-the-counter naproxen tablets twice a day for pain. Also take tylenol 1000mg (2 extra strength) four times a day.   Then take the pain medicine if you feel like you need it. Narcotics do not help with the pain, they only make you care about it less.  You can become addicted to this, people may break into your house to steal it.  It will constipate you.  If you drive under the influence of this medicine you can get a DUI.    Return for worsening pain or if you develop a fever.  Please follow-up with urologist in the office.

## 2020-08-01 DIAGNOSIS — Z6826 Body mass index (BMI) 26.0-26.9, adult: Secondary | ICD-10-CM | POA: Diagnosis not present

## 2020-08-01 DIAGNOSIS — Z124 Encounter for screening for malignant neoplasm of cervix: Secondary | ICD-10-CM | POA: Diagnosis not present

## 2020-08-01 DIAGNOSIS — Z01419 Encounter for gynecological examination (general) (routine) without abnormal findings: Secondary | ICD-10-CM | POA: Diagnosis not present

## 2020-08-07 LAB — HM PAP SMEAR: HPV, high-risk: NEGATIVE

## 2020-08-09 DIAGNOSIS — Z4509 Encounter for adjustment and management of other cardiac device: Secondary | ICD-10-CM | POA: Diagnosis not present

## 2020-08-28 ENCOUNTER — Other Ambulatory Visit: Payer: Self-pay

## 2020-08-28 ENCOUNTER — Telehealth (INDEPENDENT_AMBULATORY_CARE_PROVIDER_SITE_OTHER): Payer: BC Managed Care – PPO | Admitting: Family Medicine

## 2020-08-28 ENCOUNTER — Encounter: Payer: Self-pay | Admitting: Family Medicine

## 2020-08-28 VITALS — Wt 150.0 lb

## 2020-08-28 DIAGNOSIS — J020 Streptococcal pharyngitis: Secondary | ICD-10-CM

## 2020-08-28 MED ORDER — AMOXICILLIN 875 MG PO TABS: 875.0000 mg | ORAL_TABLET | Freq: Two times a day (BID) | ORAL | 0 refills | Status: AC

## 2020-08-28 NOTE — Progress Notes (Signed)
Virtual Visit via Video   I connected with patient on 08/28/20 at  2:30 PM EDT by a video enabled telemedicine application and verified that I am speaking with the correct person using two identifiers.  Location patient: Home Location provider: [facility] [facility], Office Persons participating in the virtual visit: Patient, Provider, CMA (Sabrina M)  I discussed the limitations of evaluation and management by telemedicine and the availability of in person appointments. The patient expressed understanding and agreed to proceed.  Subjective:   HPI:   Sore throat- son was dx'd w/ strep on Friday.  She has since developed a very sore throat- 'painful to swallow my own spit'.  No fevers.  Denies cough or congestion.  Denies HA.  No abd pain or nausea.  Throat is very red.  ROS:   See pertinent positives and negatives per HPI.  Patient Active Problem List   Diagnosis Date Noted  . Status post placement of implantable loop recorder 01/21/2019  . NSVT (nonsustained ventricular tachycardia) (HCC) 09/02/2018  . Mastitis 10/14/2015  . Calf pain 03/31/2015  . Vacuum extraction, delivered, current hospitalization 09/21/2014  . Postpartum care following vaginal delivery (6/7) 09/20/2014  . 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 Sinusitis, acute maxillary 06/23/2014  . Bleeding in early pregnancy 02/24/2014  . Breast pain, right 07/12/2013  . Superficial phlebitis of arm 01/28/2013  . Wrist pain, right 01/27/2013  . Ureteral calculi 01/12/2013  . Pilonidal cyst 09/25/2012  . Abnormal TSH 11/22/2011  . Spider bite 11/22/2011  . Migraine 11/22/2011  . Sore throat 09/30/2011  . HEMATURIA, HX OF 08/08/2009  . ALLERGIC RHINITIS 06/28/2009  . DYSPNEA/SHORTNESS OF BREATH 01/11/2009  . CONTACT DERMATITIS 07/05/2008  . SYNCOPE 04/26/2008  . CARDIAC MURMUR, AORTIC 07/13/2007  . ENDOMETRIOSIS, SITE UNSPECIFIED 06/25/2007  . PALPITATIONS, HX OF  06/25/2007    Social History   Tobacco Use  . Smoking status: Never Smoker  . Smokeless tobacco: Never Used  . Tobacco comment: never used product  Substance Use Topics  . Alcohol use: No    Alcohol/week: 0.0 standard drinks    Current Outpatient Medications:  .  loratadine (CLARITIN) 10 MG tablet, Take by mouth., Disp: , Rfl:  .  valACYclovir (VALTREX) 1000 MG tablet, Take 1 tablet (1,000 mg total) by mouth daily., Disp: 90 tablet, Rfl: 0 .  acetaminophen (TYLENOL) 500 MG tablet, Take 1,000 mg by mouth every 8 (eight) hours as needed for mild pain or moderate pain. (Patient not taking: Reported on 08/28/2020), Disp: , Rfl:  .  amoxicillin (AMOXIL) 875 MG tablet, Take 1 tablet (875 mg total) by mouth 2 (two) times daily. (Patient not taking: Reported on 08/28/2020), Disp: 20 tablet, Rfl: 0 .  fluticasone (FLONASE) 50 MCG/ACT nasal spray, USE 2 SPRAYS IN EACH NOSTRIL ONCE DAILY. (Patient not taking: Reported on 08/28/2020), Disp: 16 g, Rfl: 6 .  morphine (MSIR) 15 MG tablet, Take 0.5 tablets (7.5 mg total) by mouth every 4 (four) hours as needed for severe pain. (Patient not taking: Reported on 08/28/2020), Disp: 5 tablet, Rfl: 0 .  ondansetron (ZOFRAN ODT) 4 MG disintegrating tablet, 4mg  ODT q4 hours prn nausea/vomit (Patient not taking: Reported on 08/28/2020), Disp: 20 tablet, Rfl: 0 .  tamsulosin (FLOMAX) 0.4 MG CAPS capsule, Take 1 capsule (0.4 mg total) by mouth daily after supper. (Patient not taking: Reported on 08/28/2020), Disp: 30 capsule, Rfl: 0  Current Facility-Administered Medications:  .  promethazine (PHENERGAN) injection 25 mg, 25 mg,  Intramuscular, Q6H PRN, Sheliah Hatch, MD, 25 mg at 07/12/20 1101  Allergies  Allergen Reactions  . Codeine Other (See Comments)    Pt states this medication causes her bp to rise.    Leilani Merl [Oxymetazoline] Other (See Comments)    Pt states this medication causes her bp to drop.    Marland Kitchen Percocet [Oxycodone-Acetaminophen] Other  (See Comments)    hallucinations.    . Latex Rash    Objective:   Wt 150 lb (68 kg)   BMI 25.75 kg/m   AAOx3, NAD NCAT, EOMI No obvious CN deficits Coloring WNL Pt is able to speak clearly, coherently without shortness of breath or increased work of breathing.  Thought process is linear.  Mood is appropriate.   Assessment and Plan:   Strep throat- pt's sxs w/ known exposure make it likely that she also has strep.  Start Amoxicillin.  Reviewed supportive care and red flags that should prompt return.  Pt expressed understanding and is in agreement w/ plan.    [name], MD 08/28/2020

## 2020-08-28 NOTE — Progress Notes (Signed)
I connected with  [name] on 08/28/20 by a video enabled telemedicine application and verified that I am speaking with the correct person using two identifiers.   I discussed the limitations of evaluation and management by telemedicine. The patient expressed understanding and agreed to proceed.

## 2020-09-01 ENCOUNTER — Other Ambulatory Visit: Payer: Self-pay | Admitting: Family Medicine

## 2020-09-01 DIAGNOSIS — B009 Herpesviral infection, unspecified: Secondary | ICD-10-CM

## 2020-09-01 NOTE — Telephone Encounter (Signed)
LFD 05/29/20 #90 with no refills LOV 08/28/20 NOV none

## 2020-09-06 DIAGNOSIS — I472 Ventricular tachycardia: Secondary | ICD-10-CM | POA: Diagnosis not present

## 2020-09-06 DIAGNOSIS — R55 Syncope and collapse: Secondary | ICD-10-CM | POA: Diagnosis not present

## 2020-09-06 DIAGNOSIS — I493 Ventricular premature depolarization: Secondary | ICD-10-CM | POA: Diagnosis not present

## 2020-09-06 DIAGNOSIS — Z95818 Presence of other cardiac implants and grafts: Secondary | ICD-10-CM | POA: Diagnosis not present

## 2020-10-11 ENCOUNTER — Encounter: Payer: Self-pay | Admitting: *Deleted

## 2020-10-15 DIAGNOSIS — D729 Disorder of white blood cells, unspecified: Secondary | ICD-10-CM | POA: Diagnosis not present

## 2020-10-15 DIAGNOSIS — R112 Nausea with vomiting, unspecified: Secondary | ICD-10-CM | POA: Diagnosis not present

## 2020-10-15 DIAGNOSIS — R1013 Epigastric pain: Secondary | ICD-10-CM | POA: Diagnosis not present

## 2020-10-15 DIAGNOSIS — U071 COVID-19: Secondary | ICD-10-CM | POA: Diagnosis not present

## 2020-10-15 DIAGNOSIS — R072 Precordial pain: Secondary | ICD-10-CM | POA: Diagnosis not present

## 2020-10-15 DIAGNOSIS — R0602 Shortness of breath: Secondary | ICD-10-CM | POA: Diagnosis not present

## 2020-10-15 DIAGNOSIS — R079 Chest pain, unspecified: Secondary | ICD-10-CM | POA: Diagnosis not present

## 2020-10-16 ENCOUNTER — Inpatient Hospital Stay (HOSPITAL_BASED_OUTPATIENT_CLINIC_OR_DEPARTMENT_OTHER)
Admission: EM | Admit: 2020-10-16 | Discharge: 2020-10-23 | DRG: 339 | Disposition: A | Discharge status: Home / Self Care | Payer: BC Managed Care – PPO | Attending: General Surgery | Admitting: General Surgery

## 2020-10-16 ENCOUNTER — Other Ambulatory Visit: Payer: Self-pay

## 2020-10-16 ENCOUNTER — Encounter (HOSPITAL_BASED_OUTPATIENT_CLINIC_OR_DEPARTMENT_OTHER): Payer: Self-pay | Admitting: Emergency Medicine

## 2020-10-16 ENCOUNTER — Emergency Department (HOSPITAL_BASED_OUTPATIENT_CLINIC_OR_DEPARTMENT_OTHER): Payer: BC Managed Care – PPO

## 2020-10-16 DIAGNOSIS — Z0189 Encounter for other specified special examinations: Secondary | ICD-10-CM

## 2020-10-16 DIAGNOSIS — Z803 Family history of malignant neoplasm of breast: Secondary | ICD-10-CM | POA: Diagnosis not present

## 2020-10-16 DIAGNOSIS — R1031 Right lower quadrant pain: Secondary | ICD-10-CM | POA: Diagnosis not present

## 2020-10-16 DIAGNOSIS — K660 Peritoneal adhesions (postprocedural) (postinfection): Secondary | ICD-10-CM | POA: Diagnosis not present

## 2020-10-16 DIAGNOSIS — Z801 Family history of malignant neoplasm of trachea, bronchus and lung: Secondary | ICD-10-CM

## 2020-10-16 DIAGNOSIS — J9811 Atelectasis: Secondary | ICD-10-CM | POA: Diagnosis not present

## 2020-10-16 DIAGNOSIS — K3580 Unspecified acute appendicitis: Secondary | ICD-10-CM | POA: Diagnosis present

## 2020-10-16 DIAGNOSIS — Z86718 Personal history of other venous thrombosis and embolism: Secondary | ICD-10-CM | POA: Diagnosis not present

## 2020-10-16 DIAGNOSIS — R0602 Shortness of breath: Secondary | ICD-10-CM | POA: Diagnosis not present

## 2020-10-16 DIAGNOSIS — K3532 Acute appendicitis with perforation and localized peritonitis, without abscess: Secondary | ICD-10-CM | POA: Diagnosis not present

## 2020-10-16 DIAGNOSIS — Z833 Family history of diabetes mellitus: Secondary | ICD-10-CM | POA: Diagnosis not present

## 2020-10-16 DIAGNOSIS — Z91040 Latex allergy status: Secondary | ICD-10-CM

## 2020-10-16 DIAGNOSIS — R079 Chest pain, unspecified: Secondary | ICD-10-CM | POA: Diagnosis not present

## 2020-10-16 DIAGNOSIS — K353 Acute appendicitis with localized peritonitis, without perforation or gangrene: Secondary | ICD-10-CM | POA: Diagnosis not present

## 2020-10-16 DIAGNOSIS — Z79899 Other long term (current) drug therapy: Secondary | ICD-10-CM

## 2020-10-16 DIAGNOSIS — K381 Appendicular concretions: Secondary | ICD-10-CM | POA: Diagnosis not present

## 2020-10-16 DIAGNOSIS — Z888 Allergy status to other drugs, medicaments and biological substances status: Secondary | ICD-10-CM

## 2020-10-16 DIAGNOSIS — G43909 Migraine, unspecified, not intractable, without status migrainosus: Secondary | ICD-10-CM | POA: Diagnosis not present

## 2020-10-16 DIAGNOSIS — J309 Allergic rhinitis, unspecified: Secondary | ICD-10-CM | POA: Diagnosis not present

## 2020-10-16 DIAGNOSIS — K567 Ileus, unspecified: Secondary | ICD-10-CM | POA: Diagnosis not present

## 2020-10-16 DIAGNOSIS — R1013 Epigastric pain: Secondary | ICD-10-CM | POA: Diagnosis not present

## 2020-10-16 DIAGNOSIS — R072 Precordial pain: Secondary | ICD-10-CM | POA: Diagnosis not present

## 2020-10-16 DIAGNOSIS — Z8679 Personal history of other diseases of the circulatory system: Secondary | ICD-10-CM | POA: Diagnosis not present

## 2020-10-16 DIAGNOSIS — Z885 Allergy status to narcotic agent status: Secondary | ICD-10-CM

## 2020-10-16 DIAGNOSIS — Z8249 Family history of ischemic heart disease and other diseases of the circulatory system: Secondary | ICD-10-CM | POA: Diagnosis not present

## 2020-10-16 DIAGNOSIS — Z87442 Personal history of urinary calculi: Secondary | ICD-10-CM

## 2020-10-16 DIAGNOSIS — K3533 Acute appendicitis with perforation and localized peritonitis, with abscess: Secondary | ICD-10-CM | POA: Diagnosis not present

## 2020-10-16 DIAGNOSIS — R1084 Generalized abdominal pain: Secondary | ICD-10-CM | POA: Diagnosis not present

## 2020-10-16 DIAGNOSIS — N201 Calculus of ureter: Secondary | ICD-10-CM

## 2020-10-16 DIAGNOSIS — Z8349 Family history of other endocrine, nutritional and metabolic diseases: Secondary | ICD-10-CM | POA: Diagnosis not present

## 2020-10-16 DIAGNOSIS — D729 Disorder of white blood cells, unspecified: Secondary | ICD-10-CM | POA: Diagnosis not present

## 2020-10-16 DIAGNOSIS — U071 COVID-19: Secondary | ICD-10-CM | POA: Diagnosis not present

## 2020-10-16 DIAGNOSIS — Z8616 Personal history of COVID-19: Secondary | ICD-10-CM

## 2020-10-16 HISTORY — PX: APPENDECTOMY: SHX54

## 2020-10-16 LAB — CBC
HCT: 37.3 % (ref 36.0–46.0)
Hemoglobin: 12.5 g/dL (ref 12.0–15.0)
MCH: 30.9 pg (ref 26.0–34.0)
MCHC: 33.5 g/dL (ref 30.0–36.0)
MCV: 92.1 fL (ref 80.0–100.0)
Platelets: 175 10*3/uL (ref 150–400)
RBC: 4.05 MIL/uL (ref 3.87–5.11)
RDW: 12.9 % (ref 11.5–15.5)
WBC: 10.6 10*3/uL — ABNORMAL HIGH (ref 4.0–10.5)
nRBC: 0 % (ref 0.0–0.2)

## 2020-10-16 LAB — URINALYSIS, ROUTINE W REFLEX MICROSCOPIC
Bilirubin Urine: NEGATIVE
Glucose, UA: NEGATIVE mg/dL
Ketones, ur: >80 mg/dL — AB
Leukocytes,Ua: NEGATIVE
Nitrite: NEGATIVE
Protein, ur: 30 mg/dL — AB
RBC / HPF: >50 RBC/hpf — ABNORMAL HIGH (ref 0–5)
Specific Gravity, Urine: >1.046 — ABNORMAL HIGH (ref 1.005–1.030)
pH: 6.5 (ref 5.0–8.0)

## 2020-10-16 LAB — CBC WITH DIFFERENTIAL/PLATELET
Abs Immature Granulocytes: 0.06 10*3/uL (ref 0.00–0.07)
Basophils Absolute: 0 10*3/uL (ref 0.0–0.1)
Basophils Relative: 0 %
Eosinophils Absolute: 0 10*3/uL (ref 0.0–0.5)
Eosinophils Relative: 0 %
HCT: 37.9 % (ref 36.0–46.0)
Hemoglobin: 13 g/dL (ref 12.0–15.0)
Immature Granulocytes: 0 %
Lymphocytes Relative: 4 %
Lymphs Abs: 0.6 10*3/uL — ABNORMAL LOW (ref 0.7–4.0)
MCH: 31 pg (ref 26.0–34.0)
MCHC: 34.3 g/dL (ref 30.0–36.0)
MCV: 90.5 fL (ref 80.0–100.0)
Monocytes Absolute: 0.6 10*3/uL (ref 0.1–1.0)
Monocytes Relative: 5 %
Neutro Abs: 12.4 10*3/uL — ABNORMAL HIGH (ref 1.7–7.7)
Neutrophils Relative %: 91 %
Platelets: 212 10*3/uL (ref 150–400)
RBC: 4.19 MIL/uL (ref 3.87–5.11)
RDW: 13.1 % (ref 11.5–15.5)
WBC: 13.7 10*3/uL — ABNORMAL HIGH (ref 4.0–10.5)
nRBC: 0 % (ref 0.0–0.2)

## 2020-10-16 LAB — COMPREHENSIVE METABOLIC PANEL
ALT: 12 U/L (ref 0–44)
AST: 12 U/L — ABNORMAL LOW (ref 15–41)
Albumin: 4.1 g/dL (ref 3.5–5.0)
Alkaline Phosphatase: 49 U/L (ref 38–126)
Anion gap: 10 (ref 5–15)
BUN: 10 mg/dL (ref 6–20)
CO2: 22 mmol/L (ref 22–32)
Calcium: 9.3 mg/dL (ref 8.9–10.3)
Chloride: 103 mmol/L (ref 98–111)
Creatinine, Ser: 0.6 mg/dL (ref 0.44–1.00)
GFR, Estimated: >60 mL/min (ref >60)
Glucose, Bld: 127 mg/dL — ABNORMAL HIGH (ref 70–99)
Potassium: 3.7 mmol/L (ref 3.5–5.1)
Sodium: 135 mmol/L (ref 135–145)
Total Bilirubin: 0.9 mg/dL (ref 0.3–1.2)
Total Protein: 7.1 g/dL (ref 6.5–8.1)

## 2020-10-16 LAB — HIV ANTIBODY (ROUTINE TESTING W REFLEX): HIV Screen 4th Generation wRfx: NONREACTIVE

## 2020-10-16 LAB — SURGICAL PCR SCREEN
MRSA, PCR: NEGATIVE
Staphylococcus aureus: NEGATIVE

## 2020-10-16 LAB — LIPASE, BLOOD: Lipase: <10 U/L — ABNORMAL LOW (ref 11–51)

## 2020-10-16 LAB — LACTIC ACID, PLASMA: Lactic Acid, Venous: 1.4 mmol/L (ref 0.5–1.9)

## 2020-10-16 LAB — PREGNANCY, URINE: Preg Test, Ur: NEGATIVE

## 2020-10-16 LAB — CREATININE, SERUM
Creatinine, Ser: 0.74 mg/dL (ref 0.44–1.00)
GFR, Estimated: >60 mL/min (ref >60)

## 2020-10-16 MED ORDER — ENOXAPARIN SODIUM 40 MG/0.4ML IJ SOSY
40.0000 mg | PREFILLED_SYRINGE | INTRAMUSCULAR | Status: DC
  Administered 2020-10-17 – 2020-10-22 (×6): 40 mg via SUBCUTANEOUS
  Filled 2020-10-16 (×7): qty 0.4

## 2020-10-16 MED ORDER — PROCHLORPERAZINE MALEATE 10 MG PO TABS
10.0000 mg | ORAL_TABLET | Freq: Four times a day (QID) | ORAL | Status: DC | PRN
  Filled 2020-10-16: qty 1

## 2020-10-16 MED ORDER — FENTANYL CITRATE (PF) 100 MCG/2ML IJ SOLN
100.0000 ug | Freq: Once | INTRAMUSCULAR | Status: AC
  Administered 2020-10-16: 100 ug via INTRAVENOUS
  Filled 2020-10-16: qty 2

## 2020-10-16 MED ORDER — PIPERACILLIN-TAZOBACTAM 4.5 G IVPB: 4.5000 g | Freq: Once | INTRAVENOUS | Status: DC

## 2020-10-16 MED ORDER — ONDANSETRON HCL 4 MG/2ML IJ SOLN
4.0000 mg | Freq: Once | INTRAMUSCULAR | Status: AC
  Administered 2020-10-16: 4 mg via INTRAVENOUS
  Filled 2020-10-16: qty 2

## 2020-10-16 MED ORDER — PIPERACILLIN-TAZOBACTAM 3.375 G IVPB 30 MIN
3.3750 g | Freq: Once | INTRAVENOUS | Status: AC
  Administered 2020-10-16: 3.375 g via INTRAVENOUS
  Filled 2020-10-16: qty 50

## 2020-10-16 MED ORDER — IOHEXOL 300 MG/ML  SOLN
80.0000 mL | Freq: Once | INTRAMUSCULAR | Status: AC | PRN
  Administered 2020-10-16: 80 mL via INTRAVENOUS

## 2020-10-16 MED ORDER — ACETAMINOPHEN 325 MG PO TABS
650.0000 mg | ORAL_TABLET | Freq: Four times a day (QID) | ORAL | Status: DC | PRN
  Administered 2020-10-17: 650 mg via ORAL
  Filled 2020-10-16: qty 2

## 2020-10-16 MED ORDER — METRONIDAZOLE 500 MG/100ML IV SOLN
500.0000 mg | Freq: Three times a day (TID) | INTRAVENOUS | Status: DC
  Administered 2020-10-16 – 2020-10-17 (×3): 500 mg via INTRAVENOUS
  Filled 2020-10-16 (×3): qty 100

## 2020-10-16 MED ORDER — ONDANSETRON 4 MG PO TBDP: 4.0000 mg | ORAL_TABLET | Freq: Four times a day (QID) | ORAL | Status: DC | PRN

## 2020-10-16 MED ORDER — PROCHLORPERAZINE EDISYLATE 10 MG/2ML IJ SOLN
5.0000 mg | Freq: Four times a day (QID) | INTRAMUSCULAR | Status: DC | PRN
  Administered 2020-10-19 – 2020-10-20 (×2): 10 mg via INTRAVENOUS
  Filled 2020-10-16 (×2): qty 2

## 2020-10-16 MED ORDER — KCL IN DEXTROSE-NACL 20-5-0.45 MEQ/L-%-% IV SOLN
INTRAVENOUS | Status: DC
  Administered 2020-10-17: 999 mL via INTRAVENOUS
  Administered 2020-10-20: 100 mL/h via INTRAVENOUS
  Filled 2020-10-16 (×13): qty 1000

## 2020-10-16 MED ORDER — ACETAMINOPHEN 650 MG RE SUPP: 650.0000 mg | Freq: Four times a day (QID) | RECTAL | Status: DC | PRN

## 2020-10-16 MED ORDER — ONDANSETRON HCL 4 MG/2ML IJ SOLN
4.0000 mg | Freq: Four times a day (QID) | INTRAMUSCULAR | Status: DC | PRN
  Administered 2020-10-19 – 2020-10-21 (×2): 4 mg via INTRAVENOUS
  Filled 2020-10-16 (×5): qty 2

## 2020-10-16 MED ORDER — SODIUM CHLORIDE 0.9 % IV BOLUS
30.0000 mL/kg | Freq: Once | INTRAVENOUS | Status: AC
  Administered 2020-10-16: 1000 mL via INTRAVENOUS

## 2020-10-16 MED ORDER — METHOCARBAMOL 1000 MG/10ML IJ SOLN
500.0000 mg | Freq: Three times a day (TID) | INTRAVENOUS | Status: DC | PRN
  Filled 2020-10-16: qty 5

## 2020-10-16 MED ORDER — VALACYCLOVIR HCL 500 MG PO TABS
1000.0000 mg | ORAL_TABLET | Freq: Every day | ORAL | Status: DC
  Administered 2020-10-17: 1000 mg via ORAL
  Filled 2020-10-16 (×2): qty 2

## 2020-10-16 MED ORDER — OXYCODONE HCL 5 MG PO TABS
5.0000 mg | ORAL_TABLET | ORAL | Status: DC | PRN
  Administered 2020-10-17 – 2020-10-21 (×12): 10 mg via ORAL
  Administered 2020-10-21: 5 mg via ORAL
  Administered 2020-10-22 – 2020-10-23 (×5): 10 mg via ORAL
  Administered 2020-10-23: 5 mg via ORAL
  Administered 2020-10-23: 10 mg via ORAL
  Filled 2020-10-16 (×9): qty 2
  Filled 2020-10-16: qty 1
  Filled 2020-10-16 (×10): qty 2

## 2020-10-16 MED ORDER — DIPHENHYDRAMINE HCL 12.5 MG/5ML PO ELIX: 12.5000 mg | ORAL_SOLUTION | Freq: Four times a day (QID) | ORAL | Status: DC | PRN

## 2020-10-16 MED ORDER — FLUTICASONE PROPIONATE 50 MCG/ACT NA SUSP
2.0000 | Freq: Every day | NASAL | Status: DC
  Administered 2020-10-18 – 2020-10-23 (×6): 2 via NASAL
  Filled 2020-10-16: qty 16

## 2020-10-16 MED ORDER — FENTANYL CITRATE (PF) 100 MCG/2ML IJ SOLN
50.0000 ug | INTRAMUSCULAR | Status: DC | PRN
  Administered 2020-10-16 – 2020-10-17 (×3): 100 ug via INTRAVENOUS
  Filled 2020-10-16 (×3): qty 2

## 2020-10-16 MED ORDER — PROMETHAZINE HCL 25 MG/ML IJ SOLN
25.0000 mg | Freq: Four times a day (QID) | INTRAMUSCULAR | Status: DC | PRN
  Filled 2020-10-16: qty 1

## 2020-10-16 MED ORDER — DIPHENHYDRAMINE HCL 50 MG/ML IJ SOLN: 12.5000 mg | Freq: Four times a day (QID) | INTRAMUSCULAR | Status: DC | PRN

## 2020-10-16 MED ORDER — LORATADINE 10 MG PO TABS: 10.0000 mg | ORAL_TABLET | Freq: Every day | ORAL | Status: DC | PRN

## 2020-10-16 MED ORDER — HYDROMORPHONE HCL 1 MG/ML IJ SOLN
1.0000 mg | Freq: Once | INTRAMUSCULAR | Status: AC
  Administered 2020-10-16: 1 mg via INTRAVENOUS
  Filled 2020-10-16: qty 1

## 2020-10-16 MED ORDER — HYDROMORPHONE HCL 1 MG/ML IJ SOLN: 0.5000 mg | INTRAMUSCULAR | Status: DC | PRN

## 2020-10-16 MED ORDER — MELATONIN 3 MG PO TABS
3.0000 mg | ORAL_TABLET | Freq: Every evening | ORAL | Status: DC | PRN
  Administered 2020-10-18 – 2020-10-22 (×2): 3 mg via ORAL
  Filled 2020-10-16 (×4): qty 1

## 2020-10-16 MED ORDER — SODIUM CHLORIDE 0.9 % IV SOLN
2.0000 g | INTRAVENOUS | Status: DC
  Administered 2020-10-16: 2 g via INTRAVENOUS
  Filled 2020-10-16: qty 2
  Filled 2020-10-16 (×2): qty 20

## 2020-10-16 NOTE — ED Provider Notes (Signed)
[facility] EMERGENCY DEPT Provider Note   CSN: [id number] Arrival date & time: 10/16/20  1522     History Chief Complaint  Patient presents with  . Abdominal Pain    Jessica Diaz is a 30 y.o. female.  Jessica Diaz is with diffuse, severe abdominal pain that has been ongoing for about 5 days.  She was evaluated at [facility] yesterday.  She presented with tachycardia and was noted to have a white count of 19,000.  She had a CT angiogram of her chest and an ultrasound of her right upper quadrant.  She also tested positive for COVID-19.  She states that she had COVID-19 in February and in December.  No current COVID-19 symptoms.  She states her abdominal pain has worsened, and it is not similar to anything she has had in the past.  She does have a history of endometriosis and has had laparoscopic surgery.  She also has a history of renal colic.  The history is provided by the patient.  Abdominal Pain Pain location:  Generalized Pain quality: sharp and stabbing   Pain radiates to:  Does not radiate Pain severity:  Severe Onset quality:  Sudden Duration:  5 days Timing:  Constant Progression:  Worsening Chronicity:  New Context: not sick contacts, not suspicious food intake and not trauma   Relieved by:  Nothing Ineffective treatments:  Movement Associated symptoms: anorexia, nausea and vomiting   Associated symptoms: no chest pain, no chills, no cough, no dysuria, no fever, no hematuria, no shortness of breath and no sore throat       Past Medical History:  Diagnosis Date  . Arrhythmia    non sustained VT needs cardiac monitoring in labor  . Bleeding in early pregnancy 02/24/2014  . Endometriosis 2008   lap endometriosis fulgration- 2008  . Family history of anesthesia complication    mother and father have N/V  . Hemorrhoids   . History of herpetic whitlow 09/2016  . History of kidney stones 2014   Sx: cystoscopy  w/retrograde pyelogram, ureteroscopy and stent placement  . History of pilonidal cyst   . Hx of left knee surgery 2007  . Hx of varicella   . Hx of wisdom tooth extraction 2013  . Intermittent palpitations   . Migraine   . Nonsustained ventricular tachycardia (HCC)   . Ovarian cyst    Left  . Personal history of DVT (deep vein thrombosis) 2014   blood clot in right arm post renal stone surgery 01/12/13-on blood thinnners (Lovenox)  . PONV (postoperative nausea and vomiting)    did not have N/V after cysto  . Postpartum care following vaginal delivery (6/7) 09/20/2014  . Subchorionic hemorrhage, antepartum 2016  . Syncope 2013  . UTI (lower urinary tract infection)     Patient Active Problem List   Diagnosis Date Noted  . Status post placement of implantable loop recorder 01/21/2019  . NSVT (nonsustained ventricular tachycardia) (HCC) 09/02/2018  . Mastitis 10/14/2015  . Calf pain 03/31/2015  . Vacuum extraction, delivered, current hospitalization 09/21/2014  . Postpartum care following vaginal delivery (6/7) 09/20/2014  . 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 Sinusitis, acute maxillary 06/23/2014  . Bleeding in early pregnancy 02/24/2014  . Breast pain, right 07/12/2013  . Superficial phlebitis of arm 01/28/2013  . Wrist pain, right 01/27/2013  . Ureteral calculi 01/12/2013  . Pilonidal cyst 09/25/2012  . Abnormal TSH 11/22/2011  . Spider  bite 11/22/2011  . Migraine 11/22/2011  . Sore throat 09/30/2011  . HEMATURIA, HX OF 08/08/2009  . ALLERGIC RHINITIS 06/28/2009  . DYSPNEA/SHORTNESS OF BREATH 01/11/2009  . CONTACT DERMATITIS 07/05/2008  . SYNCOPE 04/26/2008  . CARDIAC MURMUR, AORTIC 07/13/2007  . ENDOMETRIOSIS, SITE UNSPECIFIED 06/25/2007  . PALPITATIONS, HX OF 06/25/2007    Past Surgical History:  Procedure Laterality Date  . ABLATION ON ENDOMETRIOSIS  10/24/2017   Procedure: ABLATION ON ENDOMETRIOSIS;  Surgeon:  Olivia Mackieaavon, Richard, MD;  Location: [facility];  Service: Gynecology;;  . Bluford KaufmannYSTOSCOPY WITH RETROGRADE PYELOGRAM, URETEROSCOPY AND STENT PLACEMENT Right 01/12/2013   Procedure: CYSTOSCOPY WITH RETROGRADE PYELOGRAM, URETEROSCOPY AND STENT PLACEMENT;  Surgeon: Valetta Fulleravid S Grapey, MD;  Location: [facility];  Service: Urology;  Laterality: Right;  with stone basket extraction  . KNEE SURGERY Left 2007  . LAPAROSCOPIC ENDOMETRIOSIS FULGURATION  2008  . LAPAROSCOPY  10/24/2017   Procedure: LAPAROSCOPY DIAGNOSTIC;  Surgeon: Olivia Mackieaavon, Richard, MD;  Location: [facility];  Service: Gynecology;;  . WISDOM TOOTH EXTRACTION  2013   two lowers and one upper     OB History     Gravida  2   Para  2   Term  2   Preterm      AB      Living  2      SAB      IAB      Ectopic      Multiple  0   Live Births  2           Family History  Problem Relation Age of Onset  . Heart attack Maternal Grandfather   . Breast cancer Mother [id number]  . Gestational diabetes Mother   . Hypertension Mother   . Hyperlipidemia Mother   . Heart disease Father        SBE, aorta replacement @ age 31  . Hypertension Father   . Hyperlipidemia Father   . Lung cancer Other        paternal side  . Diabetes Maternal Grandmother   . Healthy Sister        x3    Social History   Tobacco Use  . Smoking status: Never  . Smokeless tobacco: Never  . Tobacco comments:    never used product  Vaping Use  . Vaping Use: Never used  Substance Use Topics  . Alcohol use: No    Alcohol/week: 0.0 standard drinks  . Drug use: No    Home Medications Prior to Admission medications   Medication Sig Start Date End Date Taking? Authorizing Provider  acetaminophen (TYLENOL) 500 MG tablet Take 1,000 mg by mouth every 8 (eight) hours as needed for mild pain or moderate pain. Patient not taking: Reported on 08/28/2020    [provider]  fluticasone (FLONASE) 50 MCG/ACT nasal spray USE 2 SPRAYS IN EACH NOSTRIL ONCE DAILY. Patient not  taking: Reported on 08/28/2020 03/21/20   Sheliah Hatchabori, Katherine E, MD  loratadine (CLARITIN) 10 MG tablet Take by mouth.    [provider]  morphine (MSIR) 15 MG tablet Take 0.5 tablets (7.5 mg total) by mouth every 4 (four) hours as needed for severe pain. Patient not taking: Reported on 08/28/2020 07/12/20   Melene PlanFloyd, Dan, DO  ondansetron (ZOFRAN ODT) 4 MG disintegrating tablet 4mg  ODT q4 hours prn nausea/vomit Patient not taking: Reported on 08/28/2020 07/12/20   Melene PlanFloyd, Dan, DO  tamsulosin (FLOMAX) 0.4 MG CAPS capsule Take 1 capsule (0.4 mg total) by mouth daily after supper.  Patient not taking: Reported on 08/28/2020 07/12/20   Melene Plan, DO  valACYclovir (VALTREX) 1000 MG tablet TAKE 1 TABLET BY MOUTH EVERY DAY 09/01/20   Sheliah Hatch, MD    Allergies    Codeine, Decongestant [oxymetazoline], Percocet [oxycodone-acetaminophen], and Latex  Review of Systems   Review of Systems  Constitutional:  Negative for chills and fever.  HENT:  Negative for ear pain and sore throat.   Eyes:  Negative for pain and visual disturbance.  Respiratory:  Negative for cough and shortness of breath.   Cardiovascular:  Negative for chest pain and palpitations.  Gastrointestinal:  Positive for abdominal pain, anorexia, nausea and vomiting.  Genitourinary:  Negative for dysuria and hematuria.  Musculoskeletal:  Negative for arthralgias and back pain.  Skin:  Negative for color change and rash.  Neurological:  Negative for seizures and syncope.  All other systems reviewed and are negative.  Physical Exam Updated Vital Signs BP 91/60 (BP Location: Right Arm)   Pulse 86   Temp 98.5 F (36.9 C) (Oral)   Resp 19   Ht 5\' 4"  (1.626 m)   Wt 65.8 kg   LMP  (LMP Unknown)   SpO2 97%   BMI 24.89 kg/m   Physical Exam Vitals and nursing note reviewed.  Constitutional:      General: She is in acute distress.     Comments: Appears to be in pain  HENT:     Head: Normocephalic and atraumatic.  Eyes:      General: No scleral icterus. Pulmonary:     Effort: Pulmonary effort is normal. No respiratory distress.  Abdominal:     General: There is distension.     Palpations: Abdomen is soft.     Tenderness: There is generalized abdominal tenderness. There is guarding and rebound. There is no right CVA tenderness or left CVA tenderness.  Musculoskeletal:     Cervical back: Normal range of motion.  Skin:    General: Skin is warm and dry.  Neurological:     General: No focal deficit present.     Mental Status: She is alert and oriented to person, place, and time.  Psychiatric:        Mood and Affect: Mood normal.    ED Results / Procedures / Treatments   Labs (all labs ordered are listed, but only abnormal results are displayed) Labs Reviewed  LIPASE, BLOOD - Abnormal; Notable for the following components:      Result Value   Lipase <10 (*)    All other components within normal limits  COMPREHENSIVE METABOLIC PANEL - Abnormal; Notable for the following components:   Glucose, Bld 127 (*)    AST 12 (*)    All other components within normal limits  URINALYSIS, ROUTINE W REFLEX MICROSCOPIC - Abnormal; Notable for the following components:   Specific Gravity, Urine >1.046 (*)    Hgb urine dipstick LARGE (*)    Ketones, ur >80 (*)    Protein, ur 30 (*)    RBC / HPF >50 (*)    All other components within normal limits  CBC WITH DIFFERENTIAL/PLATELET - Abnormal; Notable for the following components:   WBC 13.7 (*)    Neutro Abs 12.4 (*)    Lymphs Abs 0.6 (*)    All other components within normal limits  RESP PANEL BY RT-PCR (FLU A&B, COVID) ARPGX2  CBC  PREGNANCY, URINE  LACTIC ACID, PLASMA  LACTIC ACID, PLASMA    EKG None  Radiology CT  Abdomen Pelvis W Contrast  Result Date: 10/16/2020 CLINICAL DATA:  Right lower quadrant pain EXAM: CT ABDOMEN AND PELVIS WITH CONTRAST TECHNIQUE: Multidetector CT imaging of the abdomen and pelvis was performed using the standard protocol  following bolus administration of intravenous contrast. CONTRAST:  90mL OMNIPAQUE IOHEXOL 300 MG/ML  SOLN COMPARISON:  CT 07/12/2020 FINDINGS: Lower chest: No acute abnormality. Hepatobiliary: No focal hepatic abnormality or biliary dilatation. Hyperdensity in the gallbladder which may be due to hyperdense sludge or vicarious contrast excretion. Pancreas: Unremarkable. No pancreatic ductal dilatation or surrounding inflammatory changes. Spleen: Normal in size without focal abnormality. Adrenals/Urinary Tract: Adrenal glands are normal. No hydronephrosis. Punctate stones in the right kidney. The bladder contains dilute contrast. Stomach/Bowel: Stomach nonenlarged. Fluid filled borderline distended small bowel in the pelvis likely due to local ileus. Mild terminal ileal wall thickening. Abnormal appendix. Appendix is dilated up to 17 mm. There are at least 2 appendicoliths at the origin of the appendix. Moderate periappendiceal soft tissue stranding. Vascular/Lymphatic: No significant vascular findings are present. No enlarged abdominal or pelvic lymph nodes. Reproductive: Uterus and bilateral adnexa are unremarkable. Other: No free air.  Small free fluid in the pelvis. Musculoskeletal: No acute or significant osseous findings. IMPRESSION: 1. Findings consistent with acute appendicitis. Appendix: Location: Right lower quadrant Diameter: 17 mm Appendicolith: At least 2 appendicoliths in the proximal lumen measuring up to 9 mm. Mucosal hyper-enhancement: Positive Extraluminal gas: Negative Periappendiceal collection: Negative 2. Small free fluid in the pelvis. Mild wall thickening of terminal ileum with borderline fluid distension, likely due to reactive ileitis and local ileus 3.  Punctate stones in the right kidney without obstruction. Electronically Signed   By: Jasmine Pang M.D.   On: 10/16/2020 17:53    Procedures Procedures   Medications Ordered in ED Medications  HYDROmorphone (DILAUDID) injection 1 mg  (has no administration in time range)  ondansetron (ZOFRAN) injection 4 mg (has no administration in time range)  sodium chloride 0.9 % bolus 1,974 mL (has no administration in time range)    ED Course  I have reviewed the triage vital signs and the nursing notes.  Pertinent labs & imaging results that were available during my care of the patient were reviewed by me and considered in my medical decision making (see chart for details).  Clinical Course as of 10/16/20 1825  Mon Oct 16, 2020  3716 I spoke with Dr. Donell Beers who requests patient be admitted to [facility] [AW]  1823 Ms. Mihok also has complained of urinary retention and has only been able to urinate small amounts. Requests Foley. [AW]    Clinical Course User Index [AW] Koleen Distance, MD   MDM Rules/Calculators/A&P                          Jessica Diaz presented with 3 to 4 days of worsening abdominal pain, nausea, and vomiting.  She was found to have appendicitis.  She will be treated with antibiotics and transferred to [facility] for surgical intervention. Final Clinical Impression(s) / ED Diagnoses Final diagnoses:  Acute appendicitis with localized peritonitis, without perforation, abscess, or gangrene    Rx / DC Orders ED Discharge Orders     None        Koleen Distance, MD 10/16/20 2255

## 2020-10-16 NOTE — H&P (Signed)
Jessica Diaz is an 30 y.o. female.   Chief Complaint: abdominal pain HPI:  Pt is a 30 yo F who presented to [facility] today with 5 days of abdominal pain.  She was [facility] yesterday with complains of abdominal pain and chest pain.  She had a RUQ u/s and a CTA chest which were negative.  WBCs at that time were 19k. She came to [facility] ED when her pain worsened.  Also yesterday she tested positive for COVID.  She had covid in February.  She is breastfeeding.   She described her entire lower abdomen hurting.  This is worse with movement and better with rest.     Past Medical History:  Diagnosis Date   Arrhythmia    non sustained VT needs cardiac monitoring in labor   Bleeding in early pregnancy 02/24/2014   Endometriosis 2008   lap endometriosis fulgration- 2008   Family history of anesthesia complication    mother and father have N/V   Hemorrhoids    History of herpetic whitlow 09/2016   History of kidney stones 2014   Sx: cystoscopy w/retrograde pyelogram, ureteroscopy and stent placement   History of pilonidal cyst    Hx of left knee surgery 2007   Hx of varicella    Hx of wisdom tooth extraction 2013   Intermittent palpitations    Migraine    Nonsustained ventricular tachycardia (HCC)    Ovarian cyst    Left   Personal history of DVT (deep vein thrombosis) 2014   blood clot in right arm post renal stone surgery 01/12/13-on blood thinnners (Lovenox)   PONV (postoperative nausea and vomiting)    did not have N/V after cysto   Postpartum care following vaginal delivery (6/7) 09/20/2014   Subchorionic hemorrhage, antepartum 2016   Syncope 2013   UTI (lower urinary tract infection)     Past Surgical History:  Procedure Laterality Date   ABLATION ON ENDOMETRIOSIS  10/24/2017   Procedure: ABLATION ON ENDOMETRIOSIS;  Surgeon: Olivia Mackieaavon, Richard, MD;  Location: [facility];  Service: Gynecology;;   CYSTOSCOPY WITH RETROGRADE PYELOGRAM, URETEROSCOPY AND  STENT PLACEMENT Right 01/12/2013   Procedure: CYSTOSCOPY WITH RETROGRADE PYELOGRAM, URETEROSCOPY AND STENT PLACEMENT;  Surgeon: Valetta Fulleravid S Grapey, MD;  Location: [facility];  Service: Urology;  Laterality: Right;  with stone basket extraction   KNEE SURGERY Left 2007   LAPAROSCOPIC ENDOMETRIOSIS FULGURATION  2008   LAPAROSCOPY  10/24/2017   Procedure: LAPAROSCOPY DIAGNOSTIC;  Surgeon: Olivia Mackieaavon, Richard, MD;  Location: [facility];  Service: Gynecology;;   WISDOM TOOTH EXTRACTION  2013   two lowers and one upper    Family History  Problem Relation Age of Onset   Heart attack Maternal Grandfather    Breast cancer Mother 936   Gestational diabetes Mother    Hypertension Mother    Hyperlipidemia Mother    Heart disease Father        SBE, aorta replacement @ age 31   Hypertension Father    Hyperlipidemia Father    Lung cancer Other        paternal side   Diabetes Maternal Grandmother    Healthy Sister        x3   Social History:  reports that she has never smoked. She has never used smokeless tobacco. She reports that she does not drink alcohol and does not use drugs.  Allergies:  Allergies  Allergen Reactions   Codeine Other (See Comments)    Pt  states this medication causes her bp to rise.     Decongestant [Oxymetazoline] Other (See Comments)    Pt states this medication causes her bp to drop.     Percocet [Oxycodone-Acetaminophen] Other (See Comments)    hallucinations.     Latex Rash   Current Facility-Administered Medications for the 10/16/20 encounter [facility] Encounter)  Medication   promethazine (PHENERGAN) injection 25 mg   No outpatient medications have been marked as taking for the 10/16/20 encounter [facility] Encounter).     Results for orders placed or performed during the hospital encounter of 10/16/20 (from the past 48 hour(s))  Lipase, blood     Status: Abnormal   Collection Time: 10/16/20  3:40 PM  Result Value Ref Range   Lipase <10 (L) 11 - 51 U/L    Comment:  Performed at [facility], [address] [place], [place] [address]  Comprehensive metabolic panel     Status: Abnormal   Collection Time: 10/16/20  3:40 PM  Result Value Ref Range   Sodium 135 135 - 145 mmol/L   Potassium 3.7 3.5 - 5.1 mmol/L   Chloride 103 98 - 111 mmol/L   CO2 22 22 - 32 mmol/L   Glucose, Bld 127 (H) 70 - 99 mg/dL    Comment: Glucose reference range applies only to samples taken after fasting for at least 8 hours.   BUN 10 6 - 20 mg/dL   Creatinine, Ser 1.69 0.44 - 1.00 mg/dL   Calcium 9.3 8.9 - 67.8 mg/dL   Total Protein 7.1 6.5 - 8.1 g/dL   Albumin 4.1 3.5 - 5.0 g/dL   AST 12 (L) 15 - 41 U/L   ALT 12 0 - 44 U/L   Alkaline Phosphatase 49 38 - 126 U/L   Total Bilirubin 0.9 0.3 - 1.2 mg/dL   GFR, Estimated >93 >81 mL/min    Comment: (NOTE) Calculated using the CKD-EPI Creatinine Equation (2021)    Anion gap 10 5 - 15    Comment: Performed at [facility], [address] [place], [place] [address]  CBC with Differential     Status: Abnormal   Collection Time: 10/16/20  3:40 PM  Result Value Ref Range   WBC 13.7 (H) 4.0 - 10.5 K/uL   RBC 4.19 3.87 - 5.11 MIL/uL   Hemoglobin 13.0 12.0 - 15.0 g/dL   HCT 02.5 85.2 - 77.8 %   MCV 90.5 80.0 - 100.0 fL   MCH 31.0 26.0 - 34.0 pg   MCHC 34.3 30.0 - 36.0 g/dL   RDW 24.2 35.3 - 61.4 %   Platelets 212 150 - 400 K/uL   nRBC 0.0 0.0 - 0.2 %   Neutrophils Relative % 91 %   Neutro Abs 12.4 (H) 1.7 - 7.7 K/uL   Lymphocytes Relative 4 %   Lymphs Abs 0.6 (L) 0.7 - 4.0 K/uL   Monocytes Relative 5 %   Monocytes Absolute 0.6 0.1 - 1.0 K/uL   Eosinophils Relative 0 %   Eosinophils Absolute 0.0 0.0 - 0.5 K/uL   Basophils Relative 0 %   Basophils Absolute 0.0 0.0 - 0.1 K/uL   Immature Granulocytes 0 %   Abs Immature Granulocytes 0.06 0.00 - 0.07 K/uL    Comment: Performed at [facility], [address] [place], [place] [address]  Urinalysis, Routine w reflex  microscopic     Status: Abnormal   Collection Time: 10/16/20  6:09 PM  Result Value Ref Range  Color, Urine YELLOW YELLOW   APPearance CLEAR CLEAR   Specific Gravity, Urine >1.046 (H) 1.005 - 1.030   pH 6.5 5.0 - 8.0   Glucose, UA NEGATIVE NEGATIVE mg/dL   Hgb urine dipstick LARGE (A) NEGATIVE   Bilirubin Urine NEGATIVE NEGATIVE   Ketones, ur >80 (A) NEGATIVE mg/dL   Protein, ur 30 (A) NEGATIVE mg/dL   Nitrite NEGATIVE NEGATIVE   Leukocytes,Ua NEGATIVE NEGATIVE   RBC / HPF >50 (H) 0 - 5 RBC/hpf   WBC, UA 0-5 0 - 5 WBC/hpf   Squamous Epithelial / LPF 0-5 0 - 5   Mucus PRESENT     Comment: Performed at [facility], [address], [place], [place] [address]  Pregnancy, urine     Status: None   Collection Time: 10/16/20  6:09 PM  Result Value Ref Range   Preg Test, Ur NEGATIVE NEGATIVE    Comment:        THE SENSITIVITY OF THIS METHODOLOGY IS >20 mIU/mL. Performed at [facility], [address], [place], [place] [address]    CT Abdomen Pelvis W Contrast  Result Date: 10/16/2020 CLINICAL DATA:  Right lower quadrant pain EXAM: CT ABDOMEN AND PELVIS WITH CONTRAST TECHNIQUE: Multidetector CT imaging of the abdomen and pelvis was performed using the standard protocol following bolus administration of intravenous contrast. CONTRAST:  62mL OMNIPAQUE IOHEXOL 300 MG/ML  SOLN COMPARISON:  CT 07/12/2020 FINDINGS: Lower chest: No acute abnormality. Hepatobiliary: No focal hepatic abnormality or biliary dilatation. Hyperdensity in the gallbladder which may be due to hyperdense sludge or vicarious contrast excretion. Pancreas: Unremarkable. No pancreatic ductal dilatation or surrounding inflammatory changes. Spleen: Normal in size without focal abnormality. Adrenals/Urinary Tract: Adrenal glands are normal. No hydronephrosis. Punctate stones in the right kidney. The bladder contains dilute contrast. Stomach/Bowel: Stomach nonenlarged. Fluid filled borderline  distended small bowel in the pelvis likely due to local ileus. Mild terminal ileal wall thickening. Abnormal appendix. Appendix is dilated up to 17 mm. There are at least 2 appendicoliths at the origin of the appendix. Moderate periappendiceal soft tissue stranding. Vascular/Lymphatic: No significant vascular findings are present. No enlarged abdominal or pelvic lymph nodes. Reproductive: Uterus and bilateral adnexa are unremarkable. Other: No free air.  Small free fluid in the pelvis. Musculoskeletal: No acute or significant osseous findings. IMPRESSION: 1. Findings consistent with acute appendicitis. Appendix: Location: Right lower quadrant Diameter: 17 mm Appendicolith: At least 2 appendicoliths in the proximal lumen measuring up to 9 mm. Mucosal hyper-enhancement: Positive Extraluminal gas: Negative Periappendiceal collection: Negative 2. Small free fluid in the pelvis. Mild wall thickening of terminal ileum with borderline fluid distension, likely due to reactive ileitis and local ileus 3.  Punctate stones in the right kidney without obstruction. Electronically Signed   By: Jasmine Pang M.D.   On: 10/16/2020 17:53    Review of Systems  Constitutional: Negative.   HENT: Negative.    Eyes: Negative.   Respiratory: Negative.    Cardiovascular: Negative.   Gastrointestinal:  Positive for abdominal pain and nausea.  Endocrine: Negative.   Genitourinary: Negative.   Musculoskeletal: Negative.   Skin: Negative.   Allergic/Immunologic: Negative.   Neurological: Negative.   Hematological: Negative.   Psychiatric/Behavioral: Negative.    All other systems reviewed and are negative.   Blood pressure 119/64, pulse (!) 104, temperature 98.5 F (36.9 C), temperature source Oral, resp. rate 16, height 5\' 4"  (1.626 m), weight 65.8 kg, SpO2 100 %. Physical Exam Vitals reviewed.  Constitutional:  General: She is in acute distress (looks uncomfortable).     Appearance: She is well-developed. She is  not ill-appearing.  HENT:     Head: Normocephalic and atraumatic.     Mouth/Throat:     Mouth: Mucous membranes are moist.  Eyes:     General: No scleral icterus.    Extraocular Movements: Extraocular movements intact.     Pupils: Pupils are equal, round, and reactive to light.  Cardiovascular:     Rate and Rhythm: Normal rate and regular rhythm.     Heart sounds: Normal heart sounds.  Pulmonary:     Effort: Pulmonary effort is normal.     Breath sounds: Normal breath sounds.  Chest:     Chest wall: No tenderness.  Abdominal:     General: Abdomen is protuberant. Bowel sounds are decreased. There is no distension. There are no signs of injury.     Palpations: Abdomen is soft.     Tenderness: There is abdominal tenderness in the right lower quadrant.     Hernia: No hernia is present.  Skin:    General: Skin is warm and dry.     Capillary Refill: Capillary refill takes 2 to 3 seconds.     Coloration: Skin is not cyanotic, jaundiced, mottled or pale.     Findings: No erythema or rash.  Neurological:     General: No focal deficit present.     Mental Status: She is alert and oriented to person, place, and time.  Psychiatric:        Mood and Affect: Mood normal. Mood is not anxious or depressed.        Behavior: Behavior normal.    Assessment/Plan Acute appendicitis with appendicolith COVID test positive, last known covid infection February 2022. Leukocytosis H/o SVT with loop recorder  IV antibiotics OK for clears now, NPO after midnight. It is possible that this positive covid test could be related to prior infection, but it is beyond 90 days.  I will defer decision to operate to Dr. Fredricka Bonine.      Maudry Diego, MD FACS Surgical Oncology, General Surgery, Trauma and Critical [facility], [place] [phone] for weekday/non holidays Check [email or web address] for coverage night/weekend/holidays  Do not use SecureChat as it is not reliable for timely patient care.     10/16/2020, 6:43 PM

## 2020-10-16 NOTE — ED Triage Notes (Signed)
Pt complains of abdominal pain that has been ongoing since Wednesday. Pt also complains of N/V but denies Diarrhea.  Pt was seen at [facility]  last night for tachycardia.

## 2020-10-16 NOTE — ED Notes (Signed)
Pt complained of increased pain and pressure in her stomach after giving Zofran and dilaudid.French Ana RN and  Dr. Delford Field notified

## 2020-10-16 NOTE — ED Notes (Signed)
Report given to [facility], inpatient RN

## 2020-10-17 ENCOUNTER — Inpatient Hospital Stay (HOSPITAL_COMMUNITY): Payer: BC Managed Care – PPO | Admitting: Registered Nurse

## 2020-10-17 ENCOUNTER — Encounter (HOSPITAL_COMMUNITY): Admission: EM | Disposition: A | Discharge status: Home / Self Care | Payer: Self-pay | Source: Home / Self Care

## 2020-10-17 HISTORY — PX: LAPAROSCOPIC APPENDECTOMY: SHX408

## 2020-10-17 LAB — BASIC METABOLIC PANEL
Anion gap: 7 (ref 5–15)
BUN: 8 mg/dL (ref 6–20)
CO2: 23 mmol/L (ref 22–32)
Calcium: 8.1 mg/dL — ABNORMAL LOW (ref 8.9–10.3)
Chloride: 102 mmol/L (ref 98–111)
Creatinine, Ser: 0.72 mg/dL (ref 0.44–1.00)
GFR, Estimated: >60 mL/min (ref >60)
Glucose, Bld: 148 mg/dL — ABNORMAL HIGH (ref 70–99)
Potassium: 3.4 mmol/L — ABNORMAL LOW (ref 3.5–5.1)
Sodium: 132 mmol/L — ABNORMAL LOW (ref 135–145)

## 2020-10-17 LAB — CBC
HCT: 34.8 % — ABNORMAL LOW (ref 36.0–46.0)
Hemoglobin: 11.7 g/dL — ABNORMAL LOW (ref 12.0–15.0)
MCH: 31 pg (ref 26.0–34.0)
MCHC: 33.6 g/dL (ref 30.0–36.0)
MCV: 92.3 fL (ref 80.0–100.0)
Platelets: 178 10*3/uL (ref 150–400)
RBC: 3.77 MIL/uL — ABNORMAL LOW (ref 3.87–5.11)
RDW: 12.9 % (ref 11.5–15.5)
WBC: 12.2 10*3/uL — ABNORMAL HIGH (ref 4.0–10.5)
nRBC: 0 % (ref 0.0–0.2)

## 2020-10-17 SURGERY — APPENDECTOMY, LAPAROSCOPIC
Anesthesia: General | Site: Abdomen

## 2020-10-17 MED ORDER — ROCURONIUM BROMIDE 10 MG/ML (PF) SYRINGE
PREFILLED_SYRINGE | INTRAVENOUS | Status: DC | PRN
  Administered 2020-10-17: 80 mg via INTRAVENOUS

## 2020-10-17 MED ORDER — HYDROMORPHONE HCL 1 MG/ML IJ SOLN
0.5000 mg | INTRAMUSCULAR | Status: DC | PRN
  Administered 2020-10-18 – 2020-10-20 (×4): 0.5 mg via INTRAVENOUS
  Filled 2020-10-17 (×5): qty 0.5

## 2020-10-17 MED ORDER — DIPHENHYDRAMINE HCL 50 MG/ML IJ SOLN
INTRAMUSCULAR | Status: AC
  Filled 2020-10-17: qty 1

## 2020-10-17 MED ORDER — SODIUM CHLORIDE 0.9 % IR SOLN
Status: DC | PRN
  Administered 2020-10-17: 1000 mL

## 2020-10-17 MED ORDER — FENTANYL CITRATE (PF) 250 MCG/5ML IJ SOLN
INTRAMUSCULAR | Status: DC | PRN
  Administered 2020-10-17 (×2): 50 ug via INTRAVENOUS
  Administered 2020-10-17: 100 ug via INTRAVENOUS
  Administered 2020-10-17: 50 ug via INTRAVENOUS

## 2020-10-17 MED ORDER — PIPERACILLIN-TAZOBACTAM 3.375 G IVPB
3.3750 g | Freq: Three times a day (TID) | INTRAVENOUS | Status: DC
  Administered 2020-10-17 – 2020-10-23 (×18): 3.375 g via INTRAVENOUS
  Filled 2020-10-17 (×18): qty 50

## 2020-10-17 MED ORDER — BUPIVACAINE-EPINEPHRINE 0.25% -1:200000 IJ SOLN
INTRAMUSCULAR | Status: DC | PRN
  Administered 2020-10-17: 10 mL

## 2020-10-17 MED ORDER — SCOPOLAMINE 1 MG/3DAYS TD PT72
MEDICATED_PATCH | TRANSDERMAL | Status: DC | PRN
  Administered 2020-10-17: 1 via TRANSDERMAL

## 2020-10-17 MED ORDER — DEXAMETHASONE SODIUM PHOSPHATE 10 MG/ML IJ SOLN
INTRAMUSCULAR | Status: DC | PRN
  Administered 2020-10-17: 10 mg via INTRAVENOUS

## 2020-10-17 MED ORDER — OXYCODONE HCL 5 MG PO TABS: 5.0000 mg | ORAL_TABLET | Freq: Once | ORAL | Status: DC | PRN

## 2020-10-17 MED ORDER — ACETAMINOPHEN 325 MG PO TABS: 325.0000 mg | ORAL_TABLET | ORAL | Status: DC | PRN

## 2020-10-17 MED ORDER — LIDOCAINE 2% (20 MG/ML) 5 ML SYRINGE
INTRAMUSCULAR | Status: AC
  Filled 2020-10-17: qty 5

## 2020-10-17 MED ORDER — ONDANSETRON HCL 4 MG/2ML IJ SOLN: 4.0000 mg | Freq: Once | INTRAMUSCULAR | Status: DC | PRN

## 2020-10-17 MED ORDER — CHLORHEXIDINE GLUCONATE CLOTH 2 % EX PADS
6.0000 | MEDICATED_PAD | Freq: Every day | CUTANEOUS | Status: DC
  Administered 2020-10-17 – 2020-10-22 (×5): 6 via TOPICAL

## 2020-10-17 MED ORDER — BUPIVACAINE-EPINEPHRINE (PF) 0.25% -1:200000 IJ SOLN
INTRAMUSCULAR | Status: AC
  Filled 2020-10-17: qty 30

## 2020-10-17 MED ORDER — LIDOCAINE 2% (20 MG/ML) 5 ML SYRINGE
INTRAMUSCULAR | Status: DC | PRN
  Administered 2020-10-17: 100 mg via INTRAVENOUS

## 2020-10-17 MED ORDER — ONDANSETRON HCL 4 MG/2ML IJ SOLN
INTRAMUSCULAR | Status: AC
  Filled 2020-10-17: qty 2

## 2020-10-17 MED ORDER — LACTATED RINGERS IV SOLN: INTRAVENOUS | Status: DC | PRN

## 2020-10-17 MED ORDER — MIDAZOLAM HCL 5 MG/5ML IJ SOLN
INTRAMUSCULAR | Status: DC | PRN
  Administered 2020-10-17: 2 mg via INTRAVENOUS

## 2020-10-17 MED ORDER — PROPOFOL 10 MG/ML IV BOLUS
INTRAVENOUS | Status: DC | PRN
  Administered 2020-10-17: 200 mg via INTRAVENOUS

## 2020-10-17 MED ORDER — METHOCARBAMOL 1000 MG/10ML IJ SOLN
500.0000 mg | Freq: Three times a day (TID) | INTRAVENOUS | Status: DC | PRN
  Administered 2020-10-20 – 2020-10-22 (×2): 500 mg via INTRAVENOUS
  Filled 2020-10-17 (×5): qty 5

## 2020-10-17 MED ORDER — SUGAMMADEX SODIUM 200 MG/2ML IV SOLN
INTRAVENOUS | Status: DC | PRN
  Administered 2020-10-17: 200 mg via INTRAVENOUS

## 2020-10-17 MED ORDER — ACETAMINOPHEN 160 MG/5ML PO SOLN: 325.0000 mg | ORAL | Status: DC | PRN

## 2020-10-17 MED ORDER — DEXAMETHASONE SODIUM PHOSPHATE 10 MG/ML IJ SOLN
INTRAMUSCULAR | Status: AC
  Filled 2020-10-17: qty 1

## 2020-10-17 MED ORDER — FENTANYL CITRATE (PF) 100 MCG/2ML IJ SOLN: 25.0000 ug | INTRAMUSCULAR | Status: DC | PRN

## 2020-10-17 MED ORDER — ROCURONIUM BROMIDE 10 MG/ML (PF) SYRINGE
PREFILLED_SYRINGE | INTRAVENOUS | Status: AC
  Filled 2020-10-17: qty 10

## 2020-10-17 MED ORDER — ACETAMINOPHEN 500 MG PO TABS
1000.0000 mg | ORAL_TABLET | Freq: Four times a day (QID) | ORAL | Status: DC
  Administered 2020-10-17 – 2020-10-22 (×16): 1000 mg via ORAL
  Filled 2020-10-17 (×20): qty 2

## 2020-10-17 MED ORDER — MEPERIDINE HCL 25 MG/ML IJ SOLN: 6.2500 mg | INTRAMUSCULAR | Status: DC | PRN

## 2020-10-17 MED ORDER — ONDANSETRON HCL 4 MG/2ML IJ SOLN
INTRAMUSCULAR | Status: DC | PRN
  Administered 2020-10-17: 4 mg via INTRAVENOUS

## 2020-10-17 MED ORDER — SCOPOLAMINE 1 MG/3DAYS TD PT72
MEDICATED_PATCH | TRANSDERMAL | Status: AC
  Filled 2020-10-17: qty 1

## 2020-10-17 MED ORDER — DEXMEDETOMIDINE (PRECEDEX) IN NS 20 MCG/5ML (4 MCG/ML) IV SYRINGE
PREFILLED_SYRINGE | INTRAVENOUS | Status: DC | PRN
  Administered 2020-10-17 (×2): 4 ug via INTRAVENOUS

## 2020-10-17 MED ORDER — DIPHENHYDRAMINE HCL 50 MG/ML IJ SOLN
INTRAMUSCULAR | Status: DC | PRN
  Administered 2020-10-17: 12.5 mg via INTRAVENOUS

## 2020-10-17 MED ORDER — PROPOFOL 10 MG/ML IV BOLUS
INTRAVENOUS | Status: AC
  Filled 2020-10-17: qty 20

## 2020-10-17 MED ORDER — FENTANYL CITRATE (PF) 250 MCG/5ML IJ SOLN
INTRAMUSCULAR | Status: AC
  Filled 2020-10-17: qty 5

## 2020-10-17 MED ORDER — MIDAZOLAM HCL 2 MG/2ML IJ SOLN
INTRAMUSCULAR | Status: AC
  Filled 2020-10-17: qty 2

## 2020-10-17 MED ORDER — OXYCODONE HCL 5 MG/5ML PO SOLN: 5.0000 mg | Freq: Once | ORAL | Status: DC | PRN

## 2020-10-17 MED ORDER — DOCUSATE SODIUM 100 MG PO CAPS
100.0000 mg | ORAL_CAPSULE | Freq: Two times a day (BID) | ORAL | Status: DC
  Administered 2020-10-17 – 2020-10-23 (×12): 100 mg via ORAL
  Filled 2020-10-17 (×12): qty 1

## 2020-10-17 MED ORDER — 0.9 % SODIUM CHLORIDE (POUR BTL) OPTIME
TOPICAL | Status: DC | PRN
  Administered 2020-10-17: 1000 mL

## 2020-10-17 MED ORDER — GABAPENTIN 300 MG PO CAPS
300.0000 mg | ORAL_CAPSULE | Freq: Three times a day (TID) | ORAL | Status: DC
  Administered 2020-10-17 – 2020-10-23 (×16): 300 mg via ORAL
  Filled 2020-10-17 (×16): qty 1

## 2020-10-17 MED ORDER — BISACODYL 10 MG RE SUPP: 10.0000 mg | Freq: Every day | RECTAL | Status: DC | PRN

## 2020-10-17 SURGICAL SUPPLY — 34 items
BAG COUNTER SPONGE SURGICOUNT (BAG) ×2 IMPLANT
CANISTER SUCT 3000ML PPV (MISCELLANEOUS) ×2 IMPLANT
CHLORAPREP W/TINT 26 (MISCELLANEOUS) ×2 IMPLANT
COVER SURGICAL LIGHT HANDLE (MISCELLANEOUS) ×2 IMPLANT
CUTTER FLEX LINEAR 45M (STAPLE) ×2 IMPLANT
DERMABOND ADVANCED (GAUZE/BANDAGES/DRESSINGS) ×1
DERMABOND ADVANCED .7 DNX12 (GAUZE/BANDAGES/DRESSINGS) ×1 IMPLANT
ELECT REM PT RETURN 9FT ADLT (ELECTROSURGICAL) ×2
ELECTRODE REM PT RTRN 9FT ADLT (ELECTROSURGICAL) ×1 IMPLANT
GLOVE SURG ENC MOIS LTX SZ6 (GLOVE) ×2 IMPLANT
GLOVE SURG UNDER LTX SZ6.5 (GLOVE) ×2 IMPLANT
GOWN STRL REUS W/ TWL LRG LVL3 (GOWN DISPOSABLE) ×3 IMPLANT
GOWN STRL REUS W/TWL LRG LVL3 (GOWN DISPOSABLE) ×6
GRASPER SUT TROCAR 14GX15 (MISCELLANEOUS) ×2 IMPLANT
KIT BASIN OR (CUSTOM PROCEDURE TRAY) ×2 IMPLANT
KIT TURNOVER KIT B (KITS) ×2 IMPLANT
NEEDLE INSUFFLATION 14GA 120MM (NEEDLE) ×2 IMPLANT
NS IRRIG 1000ML POUR BTL (IV SOLUTION) ×2 IMPLANT
PAD ARMBOARD 7.5X6 YLW CONV (MISCELLANEOUS) ×4 IMPLANT
POUCH SPECIMEN RETRIEVAL 10MM (ENDOMECHANICALS) ×2 IMPLANT
RELOAD STAPLE TA45 3.5 REG BLU (ENDOMECHANICALS) ×2 IMPLANT
SET IRRIG TUBING LAPAROSCOPIC (IRRIGATION / IRRIGATOR) ×2 IMPLANT
SET TUBE SMOKE EVAC HIGH FLOW (TUBING) ×2 IMPLANT
SHEARS HARMONIC ACE PLUS 36CM (ENDOMECHANICALS) ×2 IMPLANT
SLEEVE ENDOPATH XCEL 5M (ENDOMECHANICALS) ×2 IMPLANT
SPECIMEN JAR SMALL (MISCELLANEOUS) ×2 IMPLANT
SUT MNCRL AB 4-0 PS2 18 (SUTURE) ×2 IMPLANT
TOWEL GREEN STERILE FF (TOWEL DISPOSABLE) ×2 IMPLANT
TRAY FOLEY W/BAG SLVR 16FR (SET/KITS/TRAYS/PACK) ×2
TRAY FOLEY W/BAG SLVR 16FR ST (SET/KITS/TRAYS/PACK) ×1 IMPLANT
TRAY LAPAROSCOPIC MC (CUSTOM PROCEDURE TRAY) ×2 IMPLANT
TROCAR XCEL 12X100 BLDLESS (ENDOMECHANICALS) ×2 IMPLANT
TROCAR XCEL NON-BLD 5MMX100MML (ENDOMECHANICALS) ×2 IMPLANT
WATER STERILE IRR 1000ML POUR (IV SOLUTION) ×2 IMPLANT

## 2020-10-17 NOTE — Anesthesia Preprocedure Evaluation (Signed)
Anesthesia Evaluation  Patient identified by MRN, date of birth, ID band Patient awake    Reviewed: Allergy & Precautions, H&P , NPO status , Patient's Chart, lab work & pertinent test results  History of Anesthesia Complications (+) PONV, Family history of anesthesia reaction and history of anesthetic complications  Airway Mallampati: II  TM Distance: >3 FB Neck ROM: Full    Dental no notable dental hx.    Pulmonary neg pulmonary ROS,    Pulmonary exam normal breath sounds clear to auscultation       Cardiovascular + DVT  + dysrhythmias Supra Ventricular Tachycardia  Rhythm:Regular Rate:Normal     Neuro/Psych  Headaches, negative psych ROS   GI/Hepatic negative GI ROS, Neg liver ROS,   Endo/Other  negative endocrine ROS  Renal/GU negative Renal ROS  negative genitourinary   Musculoskeletal negative musculoskeletal ROS (+)   Abdominal   Peds negative pediatric ROS (+)  Hematology  (+) Blood dyscrasia, anemia ,   Anesthesia Other Findings   Reproductive/Obstetrics negative OB ROS                             Anesthesia Physical  Anesthesia Plan  ASA: 2 and emergent  Anesthesia Plan: General   Post-op Pain Management:    Induction: Intravenous  PONV Risk Score and Plan: 4 or greater and Ondansetron, Dexamethasone, Midazolam and Scopolamine patch - Pre-op  Airway Management Planned: Oral ETT  Additional Equipment: None  Intra-op Plan:   Post-operative Plan: Extubation in OR  Informed Consent: I have reviewed the patients History and Physical, chart, labs and discussed the procedure including the risks, benefits and alternatives for the proposed anesthesia with the patient or authorized representative who has indicated his/her understanding and acceptance.     Dental advisory given  Plan Discussed with: CRNA, Anesthesiologist and Surgeon  Anesthesia Plan Comments:                                           Anesthesia Evaluation  Patient identified by MRN, date of birth, ID band Patient awake    Reviewed: Allergy & Precautions, H&P , Patient's Chart, lab work & pertinent test results  Airway Mallampati: II  TM Distance: >3 FB Neck ROM: full    Dental  (+) Teeth Intact   Pulmonary  breath sounds clear to auscultation        Cardiovascular + dysrhythmias Ventricular Tachycardia Rhythm:regular Rate:Normal     Neuro/Psych    GI/Hepatic   Endo/Other    Renal/GU      Musculoskeletal   Abdominal   Peds  Hematology   Anesthesia Other Findings   No Lovenox for more than 24hr    Reproductive/Obstetrics (+) Pregnancy                             Anesthesia Physical Anesthesia Plan  ASA: II  Anesthesia Plan: Epidural   Post-op Pain Management:    Induction:   Airway Management Planned:   Additional Equipment:   Intra-op Plan:   Post-operative Plan:   Informed Consent: I have reviewed the patients History and Physical, chart, labs and discussed the procedure including the risks, benefits and alternatives for the proposed anesthesia with the patient or authorized representative who has indicated his/her understanding and acceptance.   Dental  Advisory Given  Plan Discussed with:   Anesthesia Plan Comments: (Labs checked- platelets confirmed with RN in room. Fetal heart tracing, per RN, reported to be stable enough for sitting procedure. Discussed epidural, and patient consents to the procedure:  included risk of possible headache,backache, failed block, allergic reaction, and nerve injury. This patient was asked if she had any questions or concerns before the procedure started.)        Anesthesia Quick Evaluation  Anesthesia Quick Evaluation

## 2020-10-17 NOTE — Transfer of Care (Signed)
Immediate Anesthesia Transfer of Care Note  Patient: Jessica Diaz  Procedure(s) Performed: APPENDECTOMY LAPAROSCOPIC (Abdomen)  Patient Location: PACU  Anesthesia Type:General  Level of Consciousness: awake  Airway & Oxygen Therapy: Patient Spontanous Breathing  Post-op Assessment: Report given to RN  Post vital signs: stable  Last Vitals:  Vitals Value Taken Time  BP 103/64 10/17/20 1529  Temp    Pulse 101 10/17/20 1533  Resp 17 10/17/20 1533  SpO2 100 % 10/17/20 1533  Vitals shown include unvalidated device data.  Last Pain:  Vitals:   10/17/20 0954  TempSrc: Oral  PainSc:          Complications: No notable events documented.

## 2020-10-17 NOTE — Progress Notes (Signed)
NT Shavonne let me know of temp 101. Donell Beers, MD notified. PRN tylenol given for fever. Oxy given for pain. Consent for laparoscopic appendectomy signed with husband at bedside. Patient to go down to OR later today.

## 2020-10-17 NOTE — Anesthesia Procedure Notes (Signed)
Procedure Name: Intubation Date/Time: 10/17/2020 2:26 PM Performed by: Geraldine Contras, CRNA Pre-anesthesia Checklist: Patient identified, Patient being monitored, Timeout performed, Emergency Drugs available and Suction available Patient Re-evaluated:Patient Re-evaluated prior to induction Oxygen Delivery Method: Circle system utilized Preoxygenation: Pre-oxygenation with 100% oxygen Induction Type: IV induction Ventilation: Mask ventilation without difficulty Laryngoscope Size: Mac and 3 Grade View: Grade I Tube type: Oral Tube size: 7.0 mm Number of attempts: 1 Airway Equipment and Method: Stylet Placement Confirmation: ETT inserted through vocal cords under direct vision, positive ETCO2 and breath sounds checked- equal and bilateral Secured at: 21 cm Tube secured with: Tape Dental Injury: Teeth and Oropharynx as per pre-operative assessment

## 2020-10-17 NOTE — Plan of Care (Signed)

## 2020-10-17 NOTE — Progress Notes (Signed)
Patient returned from laparoscopic appendectomy around 1530. Shavonne, NT checked vitals. Patient complained of pain and was managed with prn oxycodone. IV abx started as soon as patient returned to unit. Husband remains at bedside and patient is now resting.

## 2020-10-17 NOTE — Op Note (Signed)
Operative Report  DREA JUREWICZ 30 y.o. female  [id number]  [id number]  10/17/2020  Surgeon: Phylliss Blakes MD FACS   Assistant: Darnell Level MD ([id number]) I was personally present during and performed the key and critical portions of this procedure and immediately available throughout the entire procedure, as documented in my operative note.    Procedure performed: Laparoscopic Appendectomy   Preop diagnosis: Acute appendicitis   Post-op diagnosis/intraop findings: Acute appendicitis with purulent and feculent peritonitis, perforated appendix with gangrenous changes at the base   Specimens: appendix   EBL: minimal   Complications: none   Description of procedure: After obtaining informed consent the patient was brought to the operating room. Antibiotics had been administered. SCD's were applied. General endotracheal anesthesia was initiated and a formal time-out was performed. The abdomen was prepped and draped in the usual sterile fashion and the abdomen was entered using an infraumbilical Veress needle and insufflated to 15 mmHg. A 5 mm trocar and camera were then introduced, the abdomen was inspected and there is no evidence of injury from our entry. A suprapubic 5 mm trocar and a left lower quadrant 12 mm trocar were introduced under direct visualization following infiltration with local.  There are inflammatory and purulent adhesions of the sigmoid colon terminal ileum and cecum to the pelvic sidewall and abdominal wall in the pelvis as well as between each of these structures.  These were carefully bluntly swept apart and abscess cavity entered and purulent fluid/pus aspirated from the pelvis and right lower quadrant.  There was purulent rind on the terminal ileum, cecum and pelvic sidewalls.  With gentle blunt adhesiolysis we were ultimately able to visualize the appendix which is extremely inflamed and with perforation at the base with spillage of several fecaliths and purulent fluid.   The mesoappendix was bluntly dissected at the base of the appendix and then a blue load linear cutting stapler was used to divide the appendix from the cecum just proximal to the perforation.  Of note the tissue here is very inflamed and friable.  The mesoappendix was divided with the harmonic scalpel and hemostasis ensured.  The right lower quadrant and pelvis were then irrigated and aspirated and some of the purulent rind was debrided from the bowel.  The omentum was then brought down over the right lower quadrant and the staple line.  The appendix was placed in an Endo Catch bag and removed through the left lower quadrant trocar site.  The site was then closed with 0 Vicryl in the fascia using laparoscopic suture passer under direct visualization. The abdomen was desufflated and all trocars removed. The skin incisions were closed with subcuticular 4-0 monocryl and Dermabond. The patient was awakened, extubated and transported to the recovery room in stable condition.    All counts were correct at the completion of the case.

## 2020-10-17 NOTE — Progress Notes (Signed)
Day of Surgery   Subjective/Chief Complaint: Reports worsening pain which is somewhat diffuse.   Objective: Vital signs in last 24 hours: Temp:  [98.5 F (36.9 C)-100.4 F (38 C)] 100.4 F (38 C) (07/05 0603) Pulse Rate:  [86-104] 100 (07/05 0603) Resp:  [16-20] 18 (07/05 0603) BP: (91-121)/(60-77) 121/60 (07/05 0603) SpO2:  [97 %-100 %] 98 % (07/05 0603) Weight:  [65.8 kg] 65.8 kg (07/04 1535)    Intake/Output from previous day: 07/04 0701 - 07/05 0700 In: 1050.3 [IV Piggyback:1050.3] Out: 550 [Urine:550] Intake/Output this shift: Total I/O In: 1095.1 [I.V.:795.1; IV Piggyback:300] Out: -   Alert, appears uncomfortable Unlabored respirations Abdomen is soft, distended, diffusely tender with guarding  Lab Results:  Recent Labs    10/16/20 2120 10/17/20 0252  WBC 10.6* 12.2*  HGB 12.5 11.7*  HCT 37.3 34.8*  PLT 175 178   BMET Recent Labs    10/16/20 1540 10/16/20 2120 10/17/20 0252  NA 135  --  132*  K 3.7  --  3.4*  CL 103  --  102  CO2 22  --  23  GLUCOSE 127*  --  148*  BUN 10  --  8  CREATININE 0.60 0.74 0.72  CALCIUM 9.3  --  8.1*   PT/INR No results for input(s): LABPROT, INR in the last 72 hours. ABG No results for input(s): PHART, HCO3 in the last 72 hours.  Invalid input(s): PCO2, PO2  Studies/Results: CT Abdomen Pelvis W Contrast  Result Date: 10/16/2020 CLINICAL DATA:  Right lower quadrant pain EXAM: CT ABDOMEN AND PELVIS WITH CONTRAST TECHNIQUE: Multidetector CT imaging of the abdomen and pelvis was performed using the standard protocol following bolus administration of intravenous contrast. CONTRAST:  40mL OMNIPAQUE IOHEXOL 300 MG/ML  SOLN COMPARISON:  CT 07/12/2020 FINDINGS: Lower chest: No acute abnormality. Hepatobiliary: No focal hepatic abnormality or biliary dilatation. Hyperdensity in the gallbladder which may be due to hyperdense sludge or vicarious contrast excretion. Pancreas: Unremarkable. No pancreatic ductal dilatation or  surrounding inflammatory changes. Spleen: Normal in size without focal abnormality. Adrenals/Urinary Tract: Adrenal glands are normal. No hydronephrosis. Punctate stones in the right kidney. The bladder contains dilute contrast. Stomach/Bowel: Stomach nonenlarged. Fluid filled borderline distended small bowel in the pelvis likely due to local ileus. Mild terminal ileal wall thickening. Abnormal appendix. Appendix is dilated up to 17 mm. There are at least 2 appendicoliths at the origin of the appendix. Moderate periappendiceal soft tissue stranding. Vascular/Lymphatic: No significant vascular findings are present. No enlarged abdominal or pelvic lymph nodes. Reproductive: Uterus and bilateral adnexa are unremarkable. Other: No free air.  Small free fluid in the pelvis. Musculoskeletal: No acute or significant osseous findings. IMPRESSION: 1. Findings consistent with acute appendicitis. Appendix: Location: Right lower quadrant Diameter: 17 mm Appendicolith: At least 2 appendicoliths in the proximal lumen measuring up to 9 mm. Mucosal hyper-enhancement: Positive Extraluminal gas: Negative Periappendiceal collection: Negative 2. Small free fluid in the pelvis. Mild wall thickening of terminal ileum with borderline fluid distension, likely due to reactive ileitis and local ileus 3.  Punctate stones in the right kidney without obstruction. Electronically Signed   By: Jasmine Pang M.D.   On: 10/16/2020 17:53    Anti-infectives: Anti-infectives (From admission, onward)    Start     Dose/Rate Route Frequency Ordered Stop   10/17/20 1000  valACYclovir (VALTREX) tablet 1,000 mg        1,000 mg Oral Daily 10/16/20 2100     10/16/20 2100  cefTRIAXone (ROCEPHIN) 2  g in sodium chloride 0.9 % 100 mL IVPB       See Hyperspace for full Linked Orders Report.   2 g 200 mL/hr over 30 Minutes Intravenous Every 24 hours 10/16/20 2100     10/16/20 2100  metroNIDAZOLE (FLAGYL) IVPB 500 mg       See Hyperspace for full Linked  Orders Report.   500 mg 100 mL/hr over 60 Minutes Intravenous Every 8 hours 10/16/20 2100     10/16/20 1830  piperacillin-tazobactam (ZOSYN) IVPB 3.375 g        3.375 g 100 mL/hr over 30 Minutes Intravenous  Once 10/16/20 1819 10/16/20 1921   10/16/20 1815  piperacillin-tazobactam (ZOSYN) IVPB 4.5 g  Status:  Discontinued        4.5 g 200 mL/hr over 30 Minutes Intravenous  Once 10/16/20 1813 10/16/20 1819       Assessment/Plan: She has acute appendicitis with appendicoliths.  Worsening pain and leukocytosis despite antibiotics. I recommend proceeding with laparoscopic appendectomy. We discussed the surgery including risks of bleeding, infection, pain, scarring, injury to intra-abdominal structures, conversion to open surgery or more extensive resection, risk of staple line leak or delayed abscess, failure to resolve symptoms, postoperative ileus, incisional hernia, as well as general risks of DVT/PE, pneumonia, stroke, heart attack, death. Questions were welcomed and answered to the patient's satisfaction. We'll proceed to the operating room today.   LOS: 1 day    [name] 10/17/2020

## 2020-10-17 NOTE — Progress Notes (Signed)
Pharmacy Antibiotic Note  Jessica Diaz is a 30 y.o. female admitted on 10/16/2020 with abdominal pain, acute appendicitis with appendoclith.  Pt is S/P lap appendectomy, with findings of perforated appendix with purulent and feculent peritonitis and gangrenous changes at base of appendix. Pharmacy has been consulted for Zosyn dosing.  WBC 12.2, Tmax 101.1 F; Scr 0.72, CrCl 95.9 ml/min (renal function stable)  Plan: Zosyn 3.375 gm IV Q 8 hrs (extended infusion) Monitor WBC, temp, clinical improvement, renal function  Height: 5\' 4"  (162.6 cm) Weight: 65.8 kg (145 lb) IBW/kg (Calculated) : 54.7  Temp (24hrs), Avg:100 F (37.8 C), Min:98.8 F (37.1 C), Max:101.1 F (38.4 C)  Recent Labs  Lab 10/16/20 1540 10/16/20 1945 10/16/20 2120 10/17/20 0252  WBC 13.7*  --  10.6* 12.2*  CREATININE 0.60  --  0.74 0.72  LATICACIDVEN  --  1.4  --   --     Estimated Creatinine Clearance: 95.9 mL/min (by C-G formula based on SCr of 0.72 mg/dL).    Allergies  Allergen Reactions   Codeine Other (See Comments)    Pt states this medication causes her bp to rise.     Decongestant [Oxymetazoline] Other (See Comments)    Pt states this medication causes her bp to drop.     Percocet [Oxycodone-Acetaminophen] Other (See Comments)    hallucinations.     Latex Rash    Antimicrobials this admission: Ceftriaxone: 7/4 Metronidazole: 7/4-7/5 Zosyn: 7/4, 7/6 >> Valtrex PO: 7/4 >>  Microbiology results: 7/4 HIV screen: negative 7/4 MRSA PCR: negative  Thank you for allowing pharmacy to be a part of this patient's care.  9/4, PharmD, BCPS, [facility] Clinical Pharmacist 10/17/2020 4:11 PM

## 2020-10-18 ENCOUNTER — Encounter (HOSPITAL_COMMUNITY): Payer: Self-pay | Admitting: Surgery

## 2020-10-18 LAB — CBC
HCT: 32.1 % — ABNORMAL LOW (ref 36.0–46.0)
Hemoglobin: 11.2 g/dL — ABNORMAL LOW (ref 12.0–15.0)
MCH: 31.6 pg (ref 26.0–34.0)
MCHC: 34.9 g/dL (ref 30.0–36.0)
MCV: 90.7 fL (ref 80.0–100.0)
Platelets: 186 10*3/uL (ref 150–400)
RBC: 3.54 MIL/uL — ABNORMAL LOW (ref 3.87–5.11)
RDW: 12.6 % (ref 11.5–15.5)
WBC: 12.7 10*3/uL — ABNORMAL HIGH (ref 4.0–10.5)
nRBC: 0 % (ref 0.0–0.2)

## 2020-10-18 LAB — BASIC METABOLIC PANEL
Anion gap: 5 (ref 5–15)
BUN: <5 mg/dL — ABNORMAL LOW (ref 6–20)
CO2: 25 mmol/L (ref 22–32)
Calcium: 8.4 mg/dL — ABNORMAL LOW (ref 8.9–10.3)
Chloride: 105 mmol/L (ref 98–111)
Creatinine, Ser: 0.59 mg/dL (ref 0.44–1.00)
GFR, Estimated: >60 mL/min (ref >60)
Glucose, Bld: 158 mg/dL — ABNORMAL HIGH (ref 70–99)
Potassium: 3.7 mmol/L (ref 3.5–5.1)
Sodium: 135 mmol/L (ref 135–145)

## 2020-10-18 LAB — SURGICAL PATHOLOGY

## 2020-10-18 MED ORDER — ALUM & MAG HYDROXIDE-SIMETH 200-200-20 MG/5ML PO SUSP
30.0000 mL | Freq: Four times a day (QID) | ORAL | Status: DC | PRN
  Administered 2020-10-18: 30 mL via ORAL
  Filled 2020-10-18: qty 30

## 2020-10-18 MED ORDER — OXYBUTYNIN CHLORIDE 5 MG PO TABS
2.5000 mg | ORAL_TABLET | Freq: Three times a day (TID) | ORAL | Status: DC
  Administered 2020-10-18 – 2020-10-23 (×13): 2.5 mg via ORAL
  Filled 2020-10-18 (×13): qty 1

## 2020-10-18 NOTE — Progress Notes (Signed)
Went in patients room to remove foley. Patient stated that she did not want foley out at this time. Made MD aware of patients request

## 2020-10-18 NOTE — Plan of Care (Signed)

## 2020-10-18 NOTE — Progress Notes (Signed)
1 Day Post-Op   Subjective/Chief Complaint: Pain much better. No flatus or BM, still bloated   Objective: Vital signs in last 24 hours: Temp:  [97.9 F (36.6 C)-101.1 F (38.4 C)] 97.9 F (36.6 C) (07/06 0523) Pulse Rate:  [80-103] 80 (07/06 0523) Resp:  [15-18] 16 (07/06 0523) BP: (92-109)/(56-68) 92/56 (07/06 0523) SpO2:  [96 %-100 %] 98 % (07/06 0523) Last BM Date: 10/15/20  Intake/Output from previous day: 07/05 0701 - 07/06 0700 In: 3042.4 [P.O.:840; I.V.:1702.4; IV Piggyback:400] Out: 2100 [Urine:2050; Blood:50] Intake/Output this shift: No intake/output data recorded.  Alert, appears uncomfortable Unlabored respirations Abdomen is soft, distended, diffusely tender but decreased compared to preop. Incisions c/d/I with dermabond, no cellulitis or hematoma  Lab Results:  Recent Labs    10/16/20 2120 10/17/20 0252  WBC 10.6* 12.2*  HGB 12.5 11.7*  HCT 37.3 34.8*  PLT 175 178   BMET Recent Labs    10/16/20 1540 10/16/20 2120 10/17/20 0252  NA 135  --  132*  K 3.7  --  3.4*  CL 103  --  102  CO2 22  --  23  GLUCOSE 127*  --  148*  BUN 10  --  8  CREATININE 0.60 0.74 0.72  CALCIUM 9.3  --  8.1*   PT/INR No results for input(s): LABPROT, INR in the last 72 hours. ABG No results for input(s): PHART, HCO3 in the last 72 hours.  Invalid input(s): PCO2, PO2  Studies/Results: CT Abdomen Pelvis W Contrast  Result Date: 10/16/2020 CLINICAL DATA:  Right lower quadrant pain EXAM: CT ABDOMEN AND PELVIS WITH CONTRAST TECHNIQUE: Multidetector CT imaging of the abdomen and pelvis was performed using the standard protocol following bolus administration of intravenous contrast. CONTRAST:  44mL OMNIPAQUE IOHEXOL 300 MG/ML  SOLN COMPARISON:  CT 07/12/2020 FINDINGS: Lower chest: No acute abnormality. Hepatobiliary: No focal hepatic abnormality or biliary dilatation. Hyperdensity in the gallbladder which may be due to hyperdense sludge or vicarious contrast excretion.  Pancreas: Unremarkable. No pancreatic ductal dilatation or surrounding inflammatory changes. Spleen: Normal in size without focal abnormality. Adrenals/Urinary Tract: Adrenal glands are normal. No hydronephrosis. Punctate stones in the right kidney. The bladder contains dilute contrast. Stomach/Bowel: Stomach nonenlarged. Fluid filled borderline distended small bowel in the pelvis likely due to local ileus. Mild terminal ileal wall thickening. Abnormal appendix. Appendix is dilated up to 17 mm. There are at least 2 appendicoliths at the origin of the appendix. Moderate periappendiceal soft tissue stranding. Vascular/Lymphatic: No significant vascular findings are present. No enlarged abdominal or pelvic lymph nodes. Reproductive: Uterus and bilateral adnexa are unremarkable. Other: No free air.  Small free fluid in the pelvis. Musculoskeletal: No acute or significant osseous findings. IMPRESSION: 1. Findings consistent with acute appendicitis. Appendix: Location: Right lower quadrant Diameter: 17 mm Appendicolith: At least 2 appendicoliths in the proximal lumen measuring up to 9 mm. Mucosal hyper-enhancement: Positive Extraluminal gas: Negative Periappendiceal collection: Negative 2. Small free fluid in the pelvis. Mild wall thickening of terminal ileum with borderline fluid distension, likely due to reactive ileitis and local ileus 3.  Punctate stones in the right kidney without obstruction. Electronically Signed   By: Jasmine Pang M.D.   On: 10/16/2020 17:53    Anti-infectives: Anti-infectives (From admission, onward)    Start     Dose/Rate Route Frequency Ordered Stop   10/17/20 1630  piperacillin-tazobactam (ZOSYN) IVPB 3.375 g        3.375 g 12.5 mL/hr over 240 Minutes Intravenous Every 8 hours 10/17/20  1619     10/17/20 1000  valACYclovir (VALTREX) tablet 1,000 mg        1,000 mg Oral Daily 10/16/20 2100     10/16/20 2100  cefTRIAXone (ROCEPHIN) 2 g in sodium chloride 0.9 % 100 mL IVPB  Status:   Discontinued       See Hyperspace for full Linked Orders Report.   2 g 200 mL/hr over 30 Minutes Intravenous Every 24 hours 10/16/20 2100 10/17/20 1619   10/16/20 2100  metroNIDAZOLE (FLAGYL) IVPB 500 mg  Status:  Discontinued       See Hyperspace for full Linked Orders Report.   500 mg 100 mL/hr over 60 Minutes Intravenous Every 8 hours 10/16/20 2100 10/17/20 1619   10/16/20 1830  piperacillin-tazobactam (ZOSYN) IVPB 3.375 g        3.375 g 100 mL/hr over 30 Minutes Intravenous  Once 10/16/20 1819 10/16/20 1921   10/16/20 1815  piperacillin-tazobactam (ZOSYN) IVPB 4.5 g  Status:  Discontinued        4.5 g 200 mL/hr over 30 Minutes Intravenous  Once 10/16/20 1813 10/16/20 1819       Assessment/Plan: Acute perforated appendicitis with purulent/feculent peritonitis POD 1 s/p lap appy -discussed intraop findings and risk of delayed abscess or other complication, ileus likely -check labs -Pain better, still distended without bowel function -Continue clears today, ambulate, DC foley this evening for void check tomorrow morning   LOS: 2 days    [name] 10/18/2020

## 2020-10-18 NOTE — Progress Notes (Signed)
Patient stated that she is having bad abdominal pain since eating jello delivered from cafeteria. Dr Phylliss Blakes made aware. Patient given dilaudid for pain. Will continue to monitor per MD.

## 2020-10-19 ENCOUNTER — Inpatient Hospital Stay (HOSPITAL_COMMUNITY): Payer: BC Managed Care – PPO

## 2020-10-19 LAB — CBC
HCT: 37.2 % (ref 36.0–46.0)
Hemoglobin: 12.5 g/dL (ref 12.0–15.0)
MCH: 30.7 pg (ref 26.0–34.0)
MCHC: 33.6 g/dL (ref 30.0–36.0)
MCV: 91.4 fL (ref 80.0–100.0)
Platelets: 202 10*3/uL (ref 150–400)
RBC: 4.07 MIL/uL (ref 3.87–5.11)
RDW: 12.9 % (ref 11.5–15.5)
WBC: 10.2 10*3/uL (ref 4.0–10.5)
nRBC: 0 % (ref 0.0–0.2)

## 2020-10-19 LAB — BASIC METABOLIC PANEL
Anion gap: 6 (ref 5–15)
BUN: 8 mg/dL (ref 6–20)
CO2: 27 mmol/L (ref 22–32)
Calcium: 8.3 mg/dL — ABNORMAL LOW (ref 8.9–10.3)
Chloride: 105 mmol/L (ref 98–111)
Creatinine, Ser: 0.69 mg/dL (ref 0.44–1.00)
GFR, Estimated: >60 mL/min (ref >60)
Glucose, Bld: 102 mg/dL — ABNORMAL HIGH (ref 70–99)
Potassium: 3.6 mmol/L (ref 3.5–5.1)
Sodium: 138 mmol/L (ref 135–145)

## 2020-10-19 LAB — MAGNESIUM: Magnesium: 1.9 mg/dL (ref 1.7–2.4)

## 2020-10-19 MED ORDER — SODIUM CHLORIDE 0.9 % IV SOLN
25.0000 mg | Freq: Four times a day (QID) | INTRAVENOUS | Status: DC | PRN
  Administered 2020-10-19 (×2): 25 mg via INTRAVENOUS
  Filled 2020-10-19 (×2): qty 1

## 2020-10-19 NOTE — Progress Notes (Signed)
At end of shift, no bowel sounds auscultated.  Abdomen more distended, red, and taut. No gas, no BM.  Pt sleeping at shift change, but easily arousable.  She wants to get up to walk and sit in the gas chair, but was too weak and had severe nausea today. Currently using purewick for urination and menstruation.  Offered pt NG tube again and she refused.  No PO meds given in afternoon d/t nausea.  Zofran does not help with nausea. Phenergan works well with nausea.  Husband bedside majority of the day.

## 2020-10-19 NOTE — Progress Notes (Signed)
Successful insertion of NG tube and pt said it felt too funny in the back of her throat and she wanted it out. Tried x3 to convince her to keep it in, as well as her husband, but she was in tears and wanted it out. Removed tube.

## 2020-10-19 NOTE — Plan of Care (Signed)
  Problem: Education: Goal: Knowledge of General Education information will improve Description Including pain rating scale, medication(s)/side effects and non-pharmacologic comfort measures Outcome: Progressing   

## 2020-10-19 NOTE — Anesthesia Postprocedure Evaluation (Signed)
Anesthesia Post Note  Patient: Jessica Diaz  Procedure(s) Performed: APPENDECTOMY LAPAROSCOPIC (Abdomen)     Patient location during evaluation: PACU Anesthesia Type: General Level of consciousness: awake and alert Pain management: pain level controlled Vital Signs Assessment: post-procedure vital signs reviewed and stable Respiratory status: spontaneous breathing, nonlabored ventilation and respiratory function stable Cardiovascular status: blood pressure returned to baseline and stable Postop Assessment: no apparent nausea or vomiting Anesthetic complications: no   No notable events documented.  Last Vitals:  Vitals:   10/19/20 1241 10/19/20 1748  BP: (!) 108/59 111/68  Pulse: 68 75  Resp: 17 18  Temp: 36.8 C 37 C  SpO2: 96% 98%    Last Pain:  Vitals:   10/19/20 1748  TempSrc: Oral  PainSc:    Pain Goal:                   Mellody Dance

## 2020-10-19 NOTE — Progress Notes (Signed)
Progress Note  2 Days Post-Op  Subjective: CC: Nausea and one episode of small volume emesis yesterday. None so far today. Not passing flatus and feeling bloated. She has not ambulated as she felt very light headed when trying to get out of bed yesterday   Objective: Vital signs in last 24 hours: Temp:  [98.2 F (36.8 C)] 98.2 F (36.8 C) (07/06 1919) Pulse Rate:  [66-79] 79 (07/07 0307) Resp:  [16-17] 17 (07/07 0307) BP: (102-107)/(59-61) 103/60 (07/07 0307) SpO2:  [97 %-100 %] 97 % (07/07 0307) Last BM Date: 10/15/20  Intake/Output from previous day: 07/06 0701 - 07/07 0700 In: 578.3 [P.O.:340; I.V.:76.2; IV Piggyback:162.1] Out: 2600 [Urine:2600] Intake/Output this shift: No intake/output data recorded.  PE: General: pleasant, WD, female who is laying in bed in NAD HEENT: head is normocephalic, atraumatic. Mouth is pink and moist Heart: regular, rate, and rhythm.  Palpable radial pulses bilaterally Lungs: CTAB. Respiratory effort nonlabored Abd: hypoactive BS. Moderately distended and diffusely tender to palpation. Incisions with glue intact - no erythema or discharge MS: all 4 extremities are symmetrical with no cyanosis, clubbing, or edema. Skin: warm and dry with no masses, lesions, or rashes Psych: A&Ox3 with an appropriate affect.    Lab Results:  Recent Labs    10/17/20 0252 10/18/20 0753  WBC 12.2* 12.7*  HGB 11.7* 11.2*  HCT 34.8* 32.1*  PLT 178 186   BMET Recent Labs    10/17/20 0252 10/18/20 0753  NA 132* 135  K 3.4* 3.7  CL 102 105  CO2 23 25  GLUCOSE 148* 158*  BUN 8 <5*  CREATININE 0.72 0.59  CALCIUM 8.1* 8.4*   PT/INR No results for input(s): LABPROT, INR in the last 72 hours. CMP     Component Value Date/Time   NA 135 10/18/2020 0753   NA 140 11/19/2016 1022   NA 142 04/07/2015 1506   K 3.7 10/18/2020 0753   K 4.2 04/07/2015 1506   CL 105 10/18/2020 0753   CL 103 04/07/2015 1506   CO2 25 10/18/2020 0753   CO2 26 04/07/2015  1506   GLUCOSE 158 (H) 10/18/2020 0753   GLUCOSE 87 04/07/2015 1506   BUN <5 (L) 10/18/2020 0753   BUN 11 11/19/2016 1022   BUN 14 04/07/2015 1506   CREATININE 0.59 10/18/2020 0753   CREATININE 0.82 09/04/2017 0831   CREATININE 0.8 04/07/2015 1506   CALCIUM 8.4 (L) 10/18/2020 0753   CALCIUM 8.9 04/07/2015 1506   PROT 7.1 10/16/2020 1540   PROT 8.3 (H) 04/07/2015 1506   ALBUMIN 4.1 10/16/2020 1540   ALBUMIN 4.4 04/07/2015 1506   AST 12 (L) 10/16/2020 1540   AST 14 09/04/2017 0831   ALT 12 10/16/2020 1540   ALT 12 09/04/2017 0831   ALT 17 04/07/2015 1506   ALKPHOS 49 10/16/2020 1540   ALKPHOS 83 04/07/2015 1506   BILITOT 0.9 10/16/2020 1540   BILITOT 0.4 09/04/2017 0831   GFRNONAA >60 10/18/2020 0753   GFRNONAA >60 09/04/2017 0831   GFRAA >60 03/23/2018 1747   GFRAA >60 09/04/2017 0831   Lipase     Component Value Date/Time   LIPASE <10 (L) 10/16/2020 1540       Studies/Results: No results found.  Anti-infectives: Anti-infectives (From admission, onward)    Start     Dose/Rate Route Frequency Ordered Stop   10/17/20 1630  piperacillin-tazobactam (ZOSYN) IVPB 3.375 g        3.375 g 12.5 mL/hr over 240 Minutes Intravenous  Every 8 hours 10/17/20 1619     10/17/20 1000  valACYclovir (VALTREX) tablet 1,000 mg  Status:  Discontinued        1,000 mg Oral Daily 10/16/20 2100 10/18/20 0919   10/16/20 2100  cefTRIAXone (ROCEPHIN) 2 g in sodium chloride 0.9 % 100 mL IVPB  Status:  Discontinued       See Hyperspace for full Linked Orders Report.   2 g 200 mL/hr over 30 Minutes Intravenous Every 24 hours 10/16/20 2100 10/17/20 1619   10/16/20 2100  metroNIDAZOLE (FLAGYL) IVPB 500 mg  Status:  Discontinued       See Hyperspace for full Linked Orders Report.   500 mg 100 mL/hr over 60 Minutes Intravenous Every 8 hours 10/16/20 2100 10/17/20 1619   10/16/20 1830  piperacillin-tazobactam (ZOSYN) IVPB 3.375 g        3.375 g 100 mL/hr over 30 Minutes Intravenous  Once 10/16/20  1819 10/16/20 1921   10/16/20 1815  piperacillin-tazobactam (ZOSYN) IVPB 4.5 g  Status:  Discontinued        4.5 g 200 mL/hr over 30 Minutes Intravenous  Once 10/16/20 1813 10/16/20 1819        Assessment/Plan  Acute perforated appendicitis with purulent/feculent peritonitis  POD 2 s/p lap appy by Dr. Fredricka Bonine 7/5 - afebrile, VSS - leukocytosis stable. Continue iv abx and monitoring - distended and no bowel function - continue clears today - check orthostatic vitals and ambulate  FEN: CLD, IVF 100 mL/hr ID: zosyn VTE: lovenox Foley: out 7/7. Now with purewick  Positive COVID via PCR - asymptomatic   LOS: 3 days    Eric Form, [facility] Surgery 10/19/2020, 7:50 AM Please see Amion for pager number during day hours 7:00am-4:30pm

## 2020-10-20 LAB — BASIC METABOLIC PANEL
Anion gap: 3 — ABNORMAL LOW (ref 5–15)
BUN: 6 mg/dL (ref 6–20)
CO2: 28 mmol/L (ref 22–32)
Calcium: 8.4 mg/dL — ABNORMAL LOW (ref 8.9–10.3)
Chloride: 104 mmol/L (ref 98–111)
Creatinine, Ser: 0.62 mg/dL (ref 0.44–1.00)
GFR, Estimated: >60 mL/min (ref >60)
Glucose, Bld: 129 mg/dL — ABNORMAL HIGH (ref 70–99)
Potassium: 3.7 mmol/L (ref 3.5–5.1)
Sodium: 135 mmol/L (ref 135–145)

## 2020-10-20 LAB — CBC
HCT: 34.1 % — ABNORMAL LOW (ref 36.0–46.0)
Hemoglobin: 11.6 g/dL — ABNORMAL LOW (ref 12.0–15.0)
MCH: 31.1 pg (ref 26.0–34.0)
MCHC: 34 g/dL (ref 30.0–36.0)
MCV: 91.4 fL (ref 80.0–100.0)
Platelets: 273 10*3/uL (ref 150–400)
RBC: 3.73 MIL/uL — ABNORMAL LOW (ref 3.87–5.11)
RDW: 12.9 % (ref 11.5–15.5)
WBC: 10.7 10*3/uL — ABNORMAL HIGH (ref 4.0–10.5)
nRBC: 0 % (ref 0.0–0.2)

## 2020-10-20 LAB — MAGNESIUM: Magnesium: 2 mg/dL (ref 1.7–2.4)

## 2020-10-20 MED ORDER — METOCLOPRAMIDE HCL 5 MG/ML IJ SOLN: 10.0000 mg | Freq: Four times a day (QID) | INTRAMUSCULAR | Status: DC | PRN

## 2020-10-20 NOTE — Progress Notes (Signed)
Progress Note  3 Days Post-Op  Subjective: CC: had nausea all yesterday and emesis. NG was placed but she did not tolerate it well and requested removal. No flatus and some belching. Still bloated. Has been ambulatory in room and pain controlled with pain medications   Objective: Vital signs in last 24 hours: Temp:  [98.2 F (36.8 C)-99.1 F (37.3 C)] 99.1 F (37.3 C) (07/08 0439) Pulse Rate:  [64-86] 86 (07/08 0439) Resp:  [17-18] 18 (07/08 0439) BP: (107-118)/(59-74) 110/72 (07/08 0439) SpO2:  [96 %-98 %] 96 % (07/08 0439) Last BM Date: 10/15/20  Intake/Output from previous day: 07/07 0701 - 07/08 0700 In: 2321 [P.O.:260; I.V.:1761; IV Piggyback:300] Out: 1300 [Urine:1100; Emesis/NG output:200] Intake/Output this shift: No intake/output data recorded.  PE: General: pleasant, WD, female who is laying in bed in NAD HEENT: head is normocephalic, atraumatic. Mouth is pink and moist Heart: regular, rate, and rhythm.  Palpable radial pulses bilaterally Lungs: CTAB. Respiratory effort nonlabored Abd: hypoactive BS. Moderately distended and diffusely tender to palpation without guarding. Incisions with glue intact - no erythema or discharge MS: all 4 extremities are symmetrical with no cyanosis, clubbing, or edema. Skin: warm and dry with no masses, lesions, or rashes Psych: A&Ox3 with an appropriate affect.    Lab Results:  Recent Labs    10/18/20 0753 10/19/20 0723  WBC 12.7* 10.2  HGB 11.2* 12.5  HCT 32.1* 37.2  PLT 186 202    BMET Recent Labs    10/18/20 0753 10/19/20 0723  NA 135 138  K 3.7 3.6  CL 105 105  CO2 25 27  GLUCOSE 158* 102*  BUN <5* 8  CREATININE 0.59 0.69  CALCIUM 8.4* 8.3*    PT/INR No results for input(s): LABPROT, INR in the last 72 hours. CMP     Component Value Date/Time   NA 138 10/19/2020 0723   NA 140 11/19/2016 1022   NA 142 04/07/2015 1506   K 3.6 10/19/2020 0723   K 4.2 04/07/2015 1506   CL 105 10/19/2020 0723   CL  103 04/07/2015 1506   CO2 27 10/19/2020 0723   CO2 26 04/07/2015 1506   GLUCOSE 102 (H) 10/19/2020 0723   GLUCOSE 87 04/07/2015 1506   BUN 8 10/19/2020 0723   BUN 11 11/19/2016 1022   BUN 14 04/07/2015 1506   CREATININE 0.69 10/19/2020 0723   CREATININE 0.82 09/04/2017 0831   CREATININE 0.8 04/07/2015 1506   CALCIUM 8.3 (L) 10/19/2020 0723   CALCIUM 8.9 04/07/2015 1506   PROT 7.1 10/16/2020 1540   PROT 8.3 (H) 04/07/2015 1506   ALBUMIN 4.1 10/16/2020 1540   ALBUMIN 4.4 04/07/2015 1506   AST 12 (L) 10/16/2020 1540   AST 14 09/04/2017 0831   ALT 12 10/16/2020 1540   ALT 12 09/04/2017 0831   ALT 17 04/07/2015 1506   ALKPHOS 49 10/16/2020 1540   ALKPHOS 83 04/07/2015 1506   BILITOT 0.9 10/16/2020 1540   BILITOT 0.4 09/04/2017 0831   GFRNONAA >60 10/19/2020 0723   GFRNONAA >60 09/04/2017 0831   GFRAA >60 03/23/2018 1747   GFRAA >60 09/04/2017 0831   Lipase     Component Value Date/Time   LIPASE <10 (L) 10/16/2020 1540       Studies/Results: No results found.  Anti-infectives: Anti-infectives (From admission, onward)    Start     Dose/Rate Route Frequency Ordered Stop   10/17/20 1630  piperacillin-tazobactam (ZOSYN) IVPB 3.375 g  3.375 g 12.5 mL/hr over 240 Minutes Intravenous Every 8 hours 10/17/20 1619     10/17/20 1000  valACYclovir (VALTREX) tablet 1,000 mg  Status:  Discontinued        1,000 mg Oral Daily 10/16/20 2100 10/18/20 0919   10/16/20 2100  cefTRIAXone (ROCEPHIN) 2 g in sodium chloride 0.9 % 100 mL IVPB  Status:  Discontinued       See Hyperspace for full Linked Orders Report.   2 g 200 mL/hr over 30 Minutes Intravenous Every 24 hours 10/16/20 2100 10/17/20 1619   10/16/20 2100  metroNIDAZOLE (FLAGYL) IVPB 500 mg  Status:  Discontinued       See Hyperspace for full Linked Orders Report.   500 mg 100 mL/hr over 60 Minutes Intravenous Every 8 hours 10/16/20 2100 10/17/20 1619   10/16/20 1830  piperacillin-tazobactam (ZOSYN) IVPB 3.375 g         3.375 g 100 mL/hr over 30 Minutes Intravenous  Once 10/16/20 1819 10/16/20 1921   10/16/20 1815  piperacillin-tazobactam (ZOSYN) IVPB 4.5 g  Status:  Discontinued        4.5 g 200 mL/hr over 30 Minutes Intravenous  Once 10/16/20 1813 10/16/20 1819        Assessment/Plan  Acute perforated appendicitis with purulent/feculent peritonitis  POD 3 s/p lap appy by Dr. Fredricka Bonine 7/5 - afebrile, VSS - leukocytosis improved (10.7). Continue iv abx and monitoring - expected postop ileus: distended and no bowel function, emesis. Continue NPO with sips - continue to ambulate  FEN: NPO, IVF 100 mL/hr ID: zosyn VTE: lovenox Foley: out 7/7. Now with purewick  Positive COVID via PCR - asymptomatic   LOS: 4 days    Eric Form, [facility] Surgery 10/20/2020, 7:56 AM Please see Amion for pager number during day hours 7:00am-4:30pm

## 2020-10-20 NOTE — Progress Notes (Signed)
PT Cancellation Note  Patient Details Name: Jessica Diaz MRN: [id number] DOB: Sep 20, 1990   Cancelled Treatment:    Reason Eval/Treat Not Completed: PT screened, no needs identified, will sign off. Patient reports she has been up walking in the room several times today and sat in chair 2x, husband verifies. Feels steady and does not feel she has any PT needs at this time. Patient may need BSC or shower chair at discharge and possibly rolling walker.     Miaa Latterell 10/20/2020, 3:12 PM

## 2020-10-21 LAB — CBC
HCT: 33.6 % — ABNORMAL LOW (ref 36.0–46.0)
Hemoglobin: 11.4 g/dL — ABNORMAL LOW (ref 12.0–15.0)
MCH: 31.2 pg (ref 26.0–34.0)
MCHC: 33.9 g/dL (ref 30.0–36.0)
MCV: 92.1 fL (ref 80.0–100.0)
Platelets: 273 10*3/uL (ref 150–400)
RBC: 3.65 MIL/uL — ABNORMAL LOW (ref 3.87–5.11)
RDW: 12.8 % (ref 11.5–15.5)
WBC: 8.6 10*3/uL (ref 4.0–10.5)
nRBC: 0 % (ref 0.0–0.2)

## 2020-10-21 LAB — BASIC METABOLIC PANEL
Anion gap: 4 — ABNORMAL LOW (ref 5–15)
BUN: 5 mg/dL — ABNORMAL LOW (ref 6–20)
CO2: 26 mmol/L (ref 22–32)
Calcium: 8.3 mg/dL — ABNORMAL LOW (ref 8.9–10.3)
Chloride: 108 mmol/L (ref 98–111)
Creatinine, Ser: 0.48 mg/dL (ref 0.44–1.00)
GFR, Estimated: >60 mL/min (ref >60)
Glucose, Bld: 109 mg/dL — ABNORMAL HIGH (ref 70–99)
Potassium: 3.7 mmol/L (ref 3.5–5.1)
Sodium: 138 mmol/L (ref 135–145)

## 2020-10-21 LAB — MAGNESIUM: Magnesium: 2.1 mg/dL (ref 1.7–2.4)

## 2020-10-21 MED ORDER — METOCLOPRAMIDE HCL 5 MG/ML IJ SOLN
10.0000 mg | Freq: Four times a day (QID) | INTRAMUSCULAR | Status: DC
  Administered 2020-10-21 – 2020-10-23 (×8): 10 mg via INTRAVENOUS
  Filled 2020-10-21 (×9): qty 2

## 2020-10-21 NOTE — Progress Notes (Signed)
4 Days Post-Op   Subjective/Chief Complaint: Patient had one episode of vomiting this morning Passing flatus, no BM yet Abdomen feels less distended Still on COVID precautions but no symptoms - positive COVID test was on 10/15/20 at [facility] She is anxious to see her children   Objective: Vital signs in last 24 hours: Temp:  [98.2 F (36.8 C)-98.6 F (37 C)] 98.6 F (37 C) (07/09 0507) Pulse Rate:  [63-71] 63 (07/09 0507) Resp:  [16-18] 18 (07/09 0507) BP: (106-114)/(63-80) 114/80 (07/09 0507) SpO2:  [96 %-97 %] 97 % (07/09 0507) Last BM Date: 10/15/20  Intake/Output from previous day: 07/08 0701 - 07/09 0700 In: 2437.3 [P.O.:150; I.V.:2137.2; IV Piggyback:150.1] Out: 1000 [Urine:1000] Intake/Output this shift: No intake/output data recorded.  General: pleasant, WD, female who is laying in bed in NAD HEENT: head is normocephalic, atraumatic. Mouth is pink and moist Heart: regular, rate, and rhythm.  Palpable radial pulses bilaterally Lungs: CTAB. Respiratory effort nonlabored Abd: hypoactive BS. Mildly distended; some RLQ tenderness Incisions with glue intact - no erythema or discharge MS: all 4 extremities are symmetrical with no cyanosis, clubbing, or edema. Skin: warm and dry with no masses, lesions, or rashes Psych: A&Ox3 with an appropriate affect.  Lab Results:  Recent Labs    10/20/20 0739 10/21/20 0041  WBC 10.7* 8.6  HGB 11.6* 11.4*  HCT 34.1* 33.6*  PLT 273 273   BMET Recent Labs    10/20/20 0739 10/21/20 0041  NA 135 138  K 3.7 3.7  CL 104 108  CO2 28 26  GLUCOSE 129* 109*  BUN 6 5*  CREATININE 0.62 0.48  CALCIUM 8.4* 8.3*   PT/INR No results for input(s): LABPROT, INR in the last 72 hours. ABG No results for input(s): PHART, HCO3 in the last 72 hours.  Invalid input(s): PCO2, PO2  Studies/Results: No results found.  Anti-infectives: Anti-infectives (From admission, onward)    Start     Dose/Rate Route Frequency Ordered Stop    10/17/20 1630  piperacillin-tazobactam (ZOSYN) IVPB 3.375 g        3.375 g 12.5 mL/hr over 240 Minutes Intravenous Every 8 hours 10/17/20 1619     10/17/20 1000  valACYclovir (VALTREX) tablet 1,000 mg  Status:  Discontinued        1,000 mg Oral Daily 10/16/20 2100 10/18/20 0919   10/16/20 2100  cefTRIAXone (ROCEPHIN) 2 g in sodium chloride 0.9 % 100 mL IVPB  Status:  Discontinued       See Hyperspace for full Linked Orders Report.   2 g 200 mL/hr over 30 Minutes Intravenous Every 24 hours 10/16/20 2100 10/17/20 1619   10/16/20 2100  metroNIDAZOLE (FLAGYL) IVPB 500 mg  Status:  Discontinued       See Hyperspace for full Linked Orders Report.   500 mg 100 mL/hr over 60 Minutes Intravenous Every 8 hours 10/16/20 2100 10/17/20 1619   10/16/20 1830  piperacillin-tazobactam (ZOSYN) IVPB 3.375 g        3.375 g 100 mL/hr over 30 Minutes Intravenous  Once 10/16/20 1819 10/16/20 1921   10/16/20 1815  piperacillin-tazobactam (ZOSYN) IVPB 4.5 g  Status:  Discontinued        4.5 g 200 mL/hr over 30 Minutes Intravenous  Once 10/16/20 1813 10/16/20 1819       Assessment/Plan: Acute perforated appendicitis with purulent/feculent peritonitis s/p lap appy by Jessica Diaz 7/5 - afebrile, VSS - WBC normal. Continue iv abx and monitoring - expected postop ileus: distended but beginning  to have some bowel function - will start some clears today/ scheduled Reglan - continue to ambulate   FEN: clears IVF 100 mL/hr ID: zosyn VTE: lovenox Foley: out 7/7. Now with purewick   Positive COVID via PCR - asymptomatic.  Will enquire re: current hospital policy on duration of COVID precautions with no symptoms.    LOS: 5 days    Jessica Diaz 10/21/2020

## 2020-10-22 MED ORDER — BISACODYL 10 MG RE SUPP
10.0000 mg | Freq: Once | RECTAL | Status: AC
  Administered 2020-10-22: 10 mg via RECTAL
  Filled 2020-10-22: qty 1

## 2020-10-22 NOTE — Progress Notes (Signed)
5 Days Post-Op   Subjective/Chief Complaint: No further vomiting Feels less distended Passing flatus, but no BM Tolerating clears without difficulty Pain well-controlled   Objective: Vital signs in last 24 hours: Temp:  [97.9 F (36.6 C)-98.6 F (37 C)] 98.6 F (37 C) (07/10 0625) Pulse Rate:  [60-72] 62 (07/10 0625) Resp:  [16-17] 16 (07/10 0625) BP: (107-119)/(68-80) 114/73 (07/10 0625) SpO2:  [96 %-98 %] 98 % (07/10 0625) Last BM Date: 10/15/20  Intake/Output from previous day: 07/09 0701 - 07/10 0700 In: 1700.4 [P.O.:120; I.V.:1426; IV Piggyback:154.4] Out: 1100 [Urine:1100] Intake/Output this shift: No intake/output data recorded.  General: pleasant, WD, female who is laying in bed in NAD HEENT: head is normocephalic, atraumatic. Mouth is pink and moist Heart: regular, rate, and rhythm.  Palpable radial pulses bilaterally Lungs: CTAB. Respiratory effort nonlabored Abd: hypoactive BS. non-distended, mild RLQ tenderness Incisions with glue intact - no erythema or discharge MS: all 4 extremities are symmetrical with no cyanosis, clubbing, or edema. Skin: warm and dry with no masses, lesions, or rashes Psych: A&Ox3 with an appropriate affect.    Lab Results:  Recent Labs    10/20/20 0739 10/21/20 0041  WBC 10.7* 8.6  HGB 11.6* 11.4*  HCT 34.1* 33.6*  PLT 273 273   BMET Recent Labs    10/20/20 0739 10/21/20 0041  NA 135 138  K 3.7 3.7  CL 104 108  CO2 28 26  GLUCOSE 129* 109*  BUN 6 5*  CREATININE 0.62 0.48  CALCIUM 8.4* 8.3*   PT/INR No results for input(s): LABPROT, INR in the last 72 hours. ABG No results for input(s): PHART, HCO3 in the last 72 hours.  Invalid input(s): PCO2, PO2  Studies/Results: No results found.  Anti-infectives: Anti-infectives (From admission, onward)    Start     Dose/Rate Route Frequency Ordered Stop   10/17/20 1630  piperacillin-tazobactam (ZOSYN) IVPB 3.375 g        3.375 g 12.5 mL/hr over 240 Minutes  Intravenous Every 8 hours 10/17/20 1619     10/17/20 1000  valACYclovir (VALTREX) tablet 1,000 mg  Status:  Discontinued        1,000 mg Oral Daily 10/16/20 2100 10/18/20 0919   10/16/20 2100  cefTRIAXone (ROCEPHIN) 2 g in sodium chloride 0.9 % 100 mL IVPB  Status:  Discontinued       See Hyperspace for full Linked Orders Report.   2 g 200 mL/hr over 30 Minutes Intravenous Every 24 hours 10/16/20 2100 10/17/20 1619   10/16/20 2100  metroNIDAZOLE (FLAGYL) IVPB 500 mg  Status:  Discontinued       See Hyperspace for full Linked Orders Report.   500 mg 100 mL/hr over 60 Minutes Intravenous Every 8 hours 10/16/20 2100 10/17/20 1619   10/16/20 1830  piperacillin-tazobactam (ZOSYN) IVPB 3.375 g        3.375 g 100 mL/hr over 30 Minutes Intravenous  Once 10/16/20 1819 10/16/20 1921   10/16/20 1815  piperacillin-tazobactam (ZOSYN) IVPB 4.5 g  Status:  Discontinued        4.5 g 200 mL/hr over 30 Minutes Intravenous  Once 10/16/20 1813 10/16/20 1819       Assessment/Plan: Acute perforated appendicitis with purulent/feculent peritonitis s/p lap appy by Dr. Fredricka Bonine 7/5 - afebrile, VSS - WBC normal. Continue iv abx and monitoring - expected postop ileus: beginning to have some bowel function - advance to full liquids; scheduled Reglan - continue to ambulate   FEN: clears IVF  ID: zosyn VTE:  lovenox Foley: out 7/7.   Positive COVID via PCR - asymptomatic.  Will enquire re: current hospital policy on duration of COVID precautions with no symptoms  LOS: 6 days    [name] 10/22/2020

## 2020-10-22 NOTE — Plan of Care (Signed)
°  Problem: Education: °Goal: Knowledge of General Education information will improve °Description: Including pain rating scale, medication(s)/side effects and non-pharmacologic comfort measures °Outcome: Progressing °  °Problem: Health Behavior/Discharge Planning: °Goal: Ability to manage health-related needs will improve °Outcome: Progressing °  °Problem: Clinical Measurements: °Goal: Ability to maintain clinical measurements within normal limits will improve °Outcome: Progressing °Goal: Respiratory complications will improve °Outcome: Progressing °Goal: Cardiovascular complication will be avoided °Outcome: Progressing °  °Problem: Activity: °Goal: Risk for activity intolerance will decrease °Outcome: Progressing °  °Problem: Nutrition: °Goal: Adequate nutrition will be maintained °Outcome: Progressing °  °Problem: Elimination: °Goal: Will not experience complications related to bowel motility °Outcome: Progressing °Goal: Will not experience complications related to urinary retention °Outcome: Progressing °  °Problem: Pain Managment: °Goal: General experience of comfort will improve °Outcome: Progressing °  °Problem: Safety: °Goal: Ability to remain free from injury will improve °Outcome: Progressing °  °Problem: Skin Integrity: °Goal: Risk for impaired skin integrity will decrease °Outcome: Progressing °  °

## 2020-10-23 LAB — CBC
HCT: 34.6 % — ABNORMAL LOW (ref 36.0–46.0)
Hemoglobin: 11.6 g/dL — ABNORMAL LOW (ref 12.0–15.0)
MCH: 30.4 pg (ref 26.0–34.0)
MCHC: 33.5 g/dL (ref 30.0–36.0)
MCV: 90.8 fL (ref 80.0–100.0)
Platelets: 382 10*3/uL (ref 150–400)
RBC: 3.81 MIL/uL — ABNORMAL LOW (ref 3.87–5.11)
RDW: 12.9 % (ref 11.5–15.5)
WBC: 10.9 10*3/uL — ABNORMAL HIGH (ref 4.0–10.5)
nRBC: 0 % (ref 0.0–0.2)

## 2020-10-23 LAB — BASIC METABOLIC PANEL
Anion gap: 8 (ref 5–15)
BUN: <5 mg/dL — ABNORMAL LOW (ref 6–20)
CO2: 25 mmol/L (ref 22–32)
Calcium: 8.8 mg/dL — ABNORMAL LOW (ref 8.9–10.3)
Chloride: 103 mmol/L (ref 98–111)
Creatinine, Ser: 0.71 mg/dL (ref 0.44–1.00)
GFR, Estimated: >60 mL/min (ref >60)
Glucose, Bld: 114 mg/dL — ABNORMAL HIGH (ref 70–99)
Potassium: 4 mmol/L (ref 3.5–5.1)
Sodium: 136 mmol/L (ref 135–145)

## 2020-10-23 MED ORDER — METHOCARBAMOL 500 MG PO TABS: 500.0000 mg | ORAL_TABLET | Freq: Three times a day (TID) | ORAL | 0 refills | Status: AC | PRN

## 2020-10-23 MED ORDER — AMOXICILLIN-POT CLAVULANATE 875-125 MG PO TABS: 1.0000 | ORAL_TABLET | Freq: Two times a day (BID) | ORAL | 0 refills | Status: AC

## 2020-10-23 MED ORDER — OXYCODONE HCL 5 MG PO TABS: 5.0000 mg | ORAL_TABLET | ORAL | 0 refills | Status: AC | PRN

## 2020-10-23 MED ORDER — DOCUSATE SODIUM 100 MG PO CAPS: 100.0000 mg | ORAL_CAPSULE | Freq: Two times a day (BID) | ORAL | 0 refills | Status: DC | PRN

## 2020-10-23 MED ORDER — POLYETHYLENE GLYCOL 3350 17 G PO PACK: 17.0000 g | PACK | Freq: Every day | ORAL | 0 refills | Status: AC | PRN

## 2020-10-23 MED ORDER — ACETAMINOPHEN 500 MG PO TABS: 1000.0000 mg | ORAL_TABLET | Freq: Four times a day (QID) | ORAL | 0 refills | Status: AC | PRN

## 2020-10-23 MED ORDER — PROMETHAZINE HCL 12.5 MG PO TABS: 12.5000 mg | ORAL_TABLET | Freq: Four times a day (QID) | ORAL | 0 refills | Status: DC | PRN

## 2020-10-23 NOTE — Discharge Summary (Addendum)
[facility] Discharge Summary   Patient ID: Jessica Diaz MRN: [id number] DOB/AGE: 12-05-1990 30 y.o.  Admit date: 10/16/2020 Discharge date: 10/23/2020  Admitting Diagnosis: Acute appendicitis with appendicolith Positive COVID test  Discharge Diagnosis  Acute appendicitis with purulent and feculent peritonitis, perforated appendix with gangrenous changes at the base  Consultants None  Imaging: No results found.  Procedures Dr. Fredricka Bonine (10/17/20) - Laparoscopic Appendectomy  Hospital Course:  30 yo who presented to [facility] with 5 days of abdominal pain and day prior negative workup at [facility].  Workup showed acute appendicitis with appendicolith.  Patient was admitted and underwent procedure listed above.  Tolerated procedure well and was transferred to the floor. Post operatively she developed ileus and was managed with bowel rest. Once she had bowel function her diet was appropriately advance. She was managed with IV antibiotics - zosyn during admission.  On POD6, the patient was voiding well, tolerating diet, ambulating well, pain well controlled, vital signs stable, incisions c/d/i and felt stable for discharge home.  Patient will follow up in our office in 2-3 weeks and knows to call with questions or concerns.  She will call to confirm appointment date/time.    She will be discharged with oxycodone #20/0 and robaxin to alternate with tylenol for pain control and phenergan for nausea. She will complete 4 days of Augmentin to complete 10 day course of antibiotics postoperatively.  Physical Exam: General:  Alert, NAD, pleasant, comfortable Respiratory: effort nonlabored on room air Heart: reg rate rhythm. No cyanosis Abd:  Soft, mild distension, mild tenderness, incisions C/D/I   I or a member of my team have reviewed this patient in the Controlled Substance Database.   Allergies as of 10/23/2020       Reactions   Codeine Other (See Comments)   Pt  states this medication causes her bp to rise.    Decongestant [oxymetazoline] Other (See Comments)   Pt states this medication causes her bp to drop.    Percocet [oxycodone-acetaminophen] Other (See Comments)   hallucinations.    Latex Rash        Medication List     TAKE these medications    acetaminophen 500 MG tablet Commonly known as: TYLENOL Take 2 tablets (1,000 mg total) by mouth every 6 (six) hours as needed for mild pain or moderate pain.   amoxicillin-clavulanate 875-125 MG tablet Commonly known as: Augmentin Take 1 tablet by mouth 2 (two) times daily for 4 days.   aspirin EC 325 MG tablet Take 325 mg by mouth every 6 (six) hours as needed for mild pain.   docusate sodium 100 MG capsule Commonly known as: COLACE Take 1 capsule (100 mg total) by mouth 2 (two) times daily as needed for mild constipation or moderate constipation.   methocarbamol 500 MG tablet Commonly known as: Robaxin Take 1 tablet (500 mg total) by mouth every 8 (eight) hours as needed for up to 3 days for muscle spasms.   oxyCODONE 5 MG immediate release tablet Commonly known as: Oxy IR/ROXICODONE Take 1 tablet (5 mg total) by mouth every 4 (four) hours as needed for up to 5 days for moderate pain or severe pain.   polyethylene glycol 17 g packet Commonly known as: MIRALAX / GLYCOLAX Take 17 g by mouth daily as needed for up to 3 days for severe constipation.   promethazine 12.5 MG tablet Commonly known as: PHENERGAN Take 1 tablet (12.5 mg total) by mouth every 6 (six) hours as  needed for up to 3 days for nausea or vomiting.       ASK your doctor about these medications    valACYclovir 1000 MG tablet Commonly known as: VALTREX TAKE 1 TABLET BY MOUTH EVERY DAY               Durable Medical Equipment  (From admission, onward)           Start     Ordered   10/20/20 1555  For home use only DME 3 n 1  Once        10/20/20 1555   10/20/20 1555  For home use only DME Shower  stool  Once       Comments: Shower chair   10/20/20 1555   10/20/20 1555  For home use only DME Walker rolling  Once       Question Answer Comment  Walker: With 5 Inch Wheels   Patient needs a walker to treat with the following condition Weakness      10/20/20 1555              Follow-up Information     [facility]. Call in 1 week(s).   Specialty: General Surgery Why: We are working hard to schedule your follow up appointment. Please call later this week to confirm your appointment date and time Contact information: [address] [place] [address] [phone]                 Signed: Clarise Cruz [facility] 10/23/2020, 12:25 PM Please see Amion for pager number during day hours 7:00am-4:30pm

## 2020-10-23 NOTE — Care Management (Signed)
PT messaged NCM to order walker, 3 in1 , and shower stool. NCM ordered same with Velna Hatchet with [employer]

## 2020-10-23 NOTE — Progress Notes (Addendum)
AVS given and reviewed with pt. Medications discussed. Work note provided to pt. All questions answered to satisfaction. Pt stated she did not need any equipment. Pt verbalized understanding of information given. Pt escorted off the unit with all belongings via wheelchair by staff member.

## 2020-10-23 NOTE — Plan of Care (Signed)
  Problem: Education: Goal: Knowledge of General Education information will improve Description: Including pain rating scale, medication(s)/side effects and non-pharmacologic comfort measures Outcome: Progressing   Problem: Health Behavior/Discharge Planning: Goal: Ability to manage health-related needs will improve Outcome: Progressing   Problem: Clinical Measurements: Goal: Ability to maintain clinical measurements within normal limits will improve Outcome: Progressing Goal: Will remain free from infection Outcome: Progressing Goal: Respiratory complications will improve Outcome: Progressing   Problem: Activity: Goal: Risk for activity intolerance will decrease Outcome: Progressing   Problem: Nutrition: Goal: Adequate nutrition will be maintained Outcome: Progressing   Problem: Pain Managment: Goal: General experience of comfort will improve Outcome: Progressing   Problem: Safety: Goal: Ability to remain free from injury will improve Outcome: Progressing   Problem: Skin Integrity: Goal: Risk for impaired skin integrity will decrease Outcome: Progressing   

## 2020-10-23 NOTE — Discharge Instructions (Signed)
[facility] ______[facility] LAPAROSCOPIC SURGERY: POST OP INSTRUCTIONS Always review your discharge instruction sheet given to you by the facility where your surgery was performed. IF YOU HAVE DISABILITY OR FAMILY LEAVE FORMS, YOU MUST BRING THEM TO THE OFFICE FOR PROCESSING.   DO NOT GIVE THEM TO YOUR DOCTOR.  A prescription for pain medication may be given to you upon discharge.  Take your pain medication as prescribed, if needed.  If narcotic pain medicine is not needed, then you may take acetaminophen (Tylenol) or ibuprofen (Advil) as needed. Take your usually prescribed medications unless otherwise directed. If you need a refill on your pain medication, please contact your pharmacy.  They will contact our office to request authorization. Prescriptions will not be filled after 5pm or on week-ends. You should follow a light diet the first few days after arrival home, such as soup and crackers, etc.  Be sure to include lots of fluids daily. Most patients will experience some swelling and bruising in the area of the incisions.  Ice packs will help.  Swelling and bruising can take several days to resolve.  It is common to experience some constipation if taking pain medication after surgery.  Increasing fluid intake and taking a stool softener (such as Colace) will usually help or prevent this problem from occurring.  A mild laxative (Milk of Magnesia or Miralax) should be taken according to package instructions if there are no bowel movements after 48 hours. Unless discharge instructions indicate otherwise, you may remove your bandages 24-48 hours after surgery, and you may shower at that time.  You may have steri-strips (small skin tapes) in place directly over the incision.  These strips should be left on the skin for 7-10 days.  If your surgeon used skin glue on the incision, you may shower in 24 hours.  The glue will flake off over the next 2-3 weeks.  Any sutures or staples will be  removed at the office during your follow-up visit. ACTIVITIES:  You may resume regular (light) daily activities beginning the next day--such as daily self-care, walking, climbing stairs--gradually increasing activities as tolerated.  You may have sexual intercourse when it is comfortable.  Refrain from any heavy lifting or straining until approved by your doctor. You may drive when you are no longer taking prescription pain medication, you can comfortably wear a seatbelt, and you can safely maneuver your car and apply brakes. RETURN TO WORK:  __________________________________________________________ You should see your doctor in the office for a follow-up appointment approximately 2-3 weeks after your surgery.  Make sure that you call for this appointment within a day or two after you arrive home to insure a convenient appointment time. OTHER INSTRUCTIONS: __________________________________________________________________________________________________________________________ __________________________________________________________________________________________________________________________ WHEN TO CALL YOUR DOCTOR: Fever over 101.0 Inability to urinate Continued bleeding from incision. Increased pain, redness, or drainage from the incision. Increasing abdominal pain  The clinic staff is available to answer your questions during regular business hours.  Please don't hesitate to call and ask to speak to one of the nurses for clinical concerns.  If you have a medical emergency, go to the nearest emergency room or call 911.  A surgeon from [facility] is always on call at the hospital. [address], [address], [place], [place]  [address] ? [address] [address], [place], [place]   [address] [phone] ? [phone] ? FAX [phone] Web site: [email or web address]  

## 2020-10-25 ENCOUNTER — Telehealth: Payer: Self-pay | Admitting: *Deleted

## 2020-10-25 NOTE — Telephone Encounter (Signed)
Transition Care Management Unsuccessful Follow-up Telephone Call  Date of discharge and from where:  10-23-2020  Attempts:  1st Attempt  Reason for unsuccessful TCM follow-up call:  Voice mail full

## 2020-11-02 ENCOUNTER — Other Ambulatory Visit (HOSPITAL_COMMUNITY): Payer: Self-pay | Admitting: General Surgery

## 2020-11-02 ENCOUNTER — Other Ambulatory Visit: Payer: Self-pay | Admitting: General Surgery

## 2020-11-02 DIAGNOSIS — G8918 Other acute postprocedural pain: Secondary | ICD-10-CM

## 2020-11-03 ENCOUNTER — Other Ambulatory Visit: Payer: Self-pay

## 2020-11-03 ENCOUNTER — Ambulatory Visit (HOSPITAL_BASED_OUTPATIENT_CLINIC_OR_DEPARTMENT_OTHER)
Admission: RE | Admit: 2020-11-03 | Discharge: 2020-11-03 | Disposition: A | Discharge status: Home / Self Care | Payer: BC Managed Care – PPO | Source: Ambulatory Visit | Attending: General Surgery | Admitting: General Surgery

## 2020-11-03 ENCOUNTER — Encounter (HOSPITAL_BASED_OUTPATIENT_CLINIC_OR_DEPARTMENT_OTHER): Payer: Self-pay

## 2020-11-03 DIAGNOSIS — Z9049 Acquired absence of other specified parts of digestive tract: Secondary | ICD-10-CM | POA: Diagnosis not present

## 2020-11-03 DIAGNOSIS — N7011 Chronic salpingitis: Secondary | ICD-10-CM | POA: Diagnosis not present

## 2020-11-03 DIAGNOSIS — G8918 Other acute postprocedural pain: Secondary | ICD-10-CM | POA: Insufficient documentation

## 2020-11-03 DIAGNOSIS — R109 Unspecified abdominal pain: Secondary | ICD-10-CM | POA: Diagnosis not present

## 2020-11-03 DIAGNOSIS — R509 Fever, unspecified: Secondary | ICD-10-CM | POA: Diagnosis not present

## 2020-11-03 MED ORDER — IOHEXOL 300 MG/ML  SOLN
80.0000 mL | Freq: Once | INTRAMUSCULAR | Status: AC | PRN
  Administered 2020-11-03: 80 mL via INTRAVENOUS

## 2020-11-07 DIAGNOSIS — Z9889 Other specified postprocedural states: Secondary | ICD-10-CM | POA: Diagnosis not present

## 2020-11-08 DIAGNOSIS — N3000 Acute cystitis without hematuria: Secondary | ICD-10-CM | POA: Diagnosis not present

## 2020-12-07 ENCOUNTER — Other Ambulatory Visit: Payer: Self-pay | Admitting: Family Medicine

## 2020-12-07 DIAGNOSIS — B009 Herpesviral infection, unspecified: Secondary | ICD-10-CM

## 2020-12-07 NOTE — Telephone Encounter (Signed)
LFD 09/01/20 #90 with no refills LOV 08/28/20 NOV none

## 2021-02-05 DIAGNOSIS — J019 Acute sinusitis, unspecified: Secondary | ICD-10-CM | POA: Diagnosis not present

## 2021-02-06 DIAGNOSIS — Z4509 Encounter for adjustment and management of other cardiac device: Secondary | ICD-10-CM | POA: Diagnosis not present

## 2021-03-08 ENCOUNTER — Other Ambulatory Visit: Payer: Self-pay | Admitting: Family

## 2021-03-08 DIAGNOSIS — B009 Herpesviral infection, unspecified: Secondary | ICD-10-CM

## 2021-03-20 ENCOUNTER — Other Ambulatory Visit: Payer: Self-pay

## 2021-03-20 ENCOUNTER — Telehealth: Payer: Self-pay

## 2021-03-20 DIAGNOSIS — B009 Herpesviral infection, unspecified: Secondary | ICD-10-CM

## 2021-03-20 MED ORDER — VALACYCLOVIR HCL 1 G PO TABS: 1000.0000 mg | ORAL_TABLET | Freq: Every day | ORAL | 0 refills | Status: DC

## 2021-03-20 NOTE — Telephone Encounter (Signed)
Med refilled   Patient aware 

## 2021-03-20 NOTE — Telephone Encounter (Signed)
Caller name:Lyndsay Aleynah Rocchio callback [phone]  Encourage patient to contact the pharmacy for refills or they can request refills through [facility]  (Please schedule appointment if patient has not been seen in over a year)  MEDICATION NAME & DOSE:valACYclovir (VALTREX) 1000 MG tablet   Notes/Comments from patient:  WHAT PHARMACY WOULD THEY LIKE THIS SENT TO: [id number] - [place], [place] - [address]  [address] [place] [address]   Please notify patient: It takes 48-72 hours to process rx refill requests Ask patient to call pharmacy to ensure rx is ready before heading there.   (CLINICAL TO FILL OR ROUTE PER PROTOCOLS)

## 2021-05-04 ENCOUNTER — Other Ambulatory Visit: Payer: Self-pay | Admitting: Family Medicine

## 2021-05-07 DIAGNOSIS — Z9889 Other specified postprocedural states: Secondary | ICD-10-CM | POA: Diagnosis not present

## 2021-07-12 ENCOUNTER — Other Ambulatory Visit: Payer: Self-pay

## 2021-07-12 ENCOUNTER — Telehealth: Payer: Self-pay

## 2021-07-12 DIAGNOSIS — B009 Herpesviral infection, unspecified: Secondary | ICD-10-CM

## 2021-07-12 MED ORDER — VALACYCLOVIR HCL 1 G PO TABS: 1000.0000 mg | ORAL_TABLET | Freq: Every day | ORAL | 0 refills | Status: DC

## 2021-07-12 NOTE — Telephone Encounter (Signed)
Refill has been sent but patient is due for an appointment please schedule to see Dr Beverely Low  ?

## 2021-07-12 NOTE — Telephone Encounter (Signed)
MEDICATION: valACYclovir (VALTREX) 1000 MG tablet // flonase  ? ?PHARMACY: [id number] - [place], [place] - [address] ? ?Comments: Patient states pharmacy has tried contacting us but has had no response.  ? ?**Let patient know to contact pharmacy at the end of the day to make sure medication is ready. ** ? ?** Please notify patient to allow 48-72 hours to process** ? ?**Encourage patient to contact the pharmacy for refills or they can request refills through [facility]** ? ? ?

## 2021-07-13 NOTE — Telephone Encounter (Signed)
LVM to get scheduled for an appointment.  

## 2021-07-26 ENCOUNTER — Encounter: Payer: Self-pay | Admitting: Registered Nurse

## 2021-07-26 ENCOUNTER — Other Ambulatory Visit: Payer: Self-pay

## 2021-07-26 ENCOUNTER — Telehealth (INDEPENDENT_AMBULATORY_CARE_PROVIDER_SITE_OTHER): Payer: BC Managed Care – PPO | Admitting: Registered Nurse

## 2021-07-26 VITALS — Wt 148.0 lb

## 2021-07-26 DIAGNOSIS — J019 Acute sinusitis, unspecified: Secondary | ICD-10-CM

## 2021-07-26 DIAGNOSIS — B9689 Other specified bacterial agents as the cause of diseases classified elsewhere: Secondary | ICD-10-CM | POA: Diagnosis not present

## 2021-07-26 MED ORDER — AMOXICILLIN 875 MG PO TABS: 875.0000 mg | ORAL_TABLET | Freq: Two times a day (BID) | ORAL | 0 refills | Status: AC

## 2021-07-26 MED ORDER — AZELASTINE HCL 0.1 % NA SOLN: 1.0000 | Freq: Two times a day (BID) | NASAL | 12 refills | Status: DC

## 2021-07-26 NOTE — Progress Notes (Signed)
? ? ?Telemedicine Encounter- SOAP NOTE Established Patient ? ?This telephone encounter was conducted with the patient's (or proxy's) verbal consent via audio telecommunications: yes/no: Yes ?Patient was instructed to have this encounter in a suitably private space; and to only have persons present to whom they give permission to participate. In addition, patient identity was confirmed by use of name plus two identifiers (DOB and address).  I discussed the limitations, risks, security and privacy concerns of performing an evaluation and management service by telephone and the availability of in person appointments. I also discussed with the patient that there may be a patient responsible charge related to this service. The patient expressed understanding and agreed to proceed. ? ?I spent a total of 14 minutes talking with the patient or their proxy. ? ?Patient at home ?Provider in office ? ?Participants: Jari Sportsman, NP and [name] ? ?Chief Complaint  ?Patient presents with  ? Nasal Congestion  ?  Patient states she is having some head congestion , sneezing, and head pressure. She is taking Claritin.  ? ? ?Subjective  ? ?Jessica Diaz is a 31 y.o. established patient. Telephone visit today for sinus infection ? ?HPI ?Onset 4-5 days ago. ?A lot of pressure in frontal and maxillary sinuses ?Has been taking benadryl, claritin, nasacort. No relief.  ?No fevers, chills, fatigue, nvd, shob, doe,  ?No sick contacts.  ? ?Has not taken home covid test. ? ?Patient Active Problem List  ? Diagnosis Date Noted  ? Appendicitis, acute 10/16/2020  ? Acute appendicitis 10/16/2020  ? Status post placement of implantable loop recorder 01/21/2019  ? NSVT (nonsustained ventricular tachycardia) (HCC) 09/02/2018  ? Mastitis 10/14/2015  ? Calf pain 03/31/2015  ? Vacuum extraction, delivered, current hospitalization 09/21/2014  ? Postpartum care following vaginal delivery (6/7) 09/20/2014  ? Thrombophilia affecting pregnancy in  third trimester, antepartum (HCC) 09/19/2014  ? Sinusitis, acute maxillary 06/23/2014  ? Bleeding in early pregnancy 02/24/2014  ? Breast pain, right 07/12/2013  ? Superficial phlebitis of arm 01/28/2013  ? Wrist pain, right 01/27/2013  ? Ureteral calculi 01/12/2013  ? Pilonidal cyst 09/25/2012  ? Abnormal TSH 11/22/2011  ? Spider bite 11/22/2011  ? Migraine 11/22/2011  ? Sore throat 09/30/2011  ? HEMATURIA, HX OF 08/08/2009  ? ALLERGIC RHINITIS 06/28/2009  ? DYSPNEA/SHORTNESS OF BREATH 01/11/2009  ? CONTACT DERMATITIS 07/05/2008  ? SYNCOPE 04/26/2008  ? CARDIAC MURMUR, AORTIC 07/13/2007  ? ENDOMETRIOSIS, SITE UNSPECIFIED 06/25/2007  ? PALPITATIONS, HX OF 06/25/2007  ? ? ?Past Medical History:  ?Diagnosis Date  ? Arrhythmia   ? non sustained VT needs cardiac monitoring in labor  ? Bleeding in early pregnancy 02/24/2014  ? Endometriosis 2008  ? lap endometriosis fulgration- 2008  ? Family history of anesthesia complication   ? mother and father have N/V  ? Hemorrhoids   ? History of herpetic whitlow 09/2016  ? History of kidney stones 2014  ? Sx: cystoscopy w/retrograde pyelogram, ureteroscopy and stent placement  ? History of pilonidal cyst   ? Hx of left knee surgery 2007  ? Hx of varicella   ? Hx of wisdom tooth extraction 2013  ? Intermittent palpitations   ? Migraine   ? Nonsustained ventricular tachycardia (HCC)   ? Ovarian cyst   ? Left  ? Personal history of DVT (deep vein thrombosis) 2014  ? blood clot in right arm post renal stone surgery 01/12/13-on blood thinnners (Lovenox)  ? PONV (postoperative nausea and vomiting)   ? did not have  N/V after cysto  ? Postpartum care following vaginal delivery (6/7) 09/20/2014  ? Subchorionic hemorrhage, antepartum 2016  ? Syncope 2013  ? UTI (lower urinary tract infection)   ? ? ?Current Outpatient Medications  ?Medication Sig Dispense Refill  ? acetaminophen (TYLENOL) 500 MG tablet Take 2 tablets (1,000 mg total) by mouth every 6 (six) hours as needed for mild pain or  moderate pain. 30 tablet 0  ? amoxicillin (AMOXIL) 875 MG tablet Take 1 tablet (875 mg total) by mouth 2 (two) times daily for 10 days. 20 tablet 0  ? aspirin EC 325 MG tablet Take 325 mg by mouth every 6 (six) hours as needed for mild pain.    ? azelastine (ASTELIN) 0.1 % nasal spray Place 1 spray into both nostrils 2 (two) times daily. Use in each nostril as directed 30 mL 12  ? docusate sodium (COLACE) 100 MG capsule Take 1 capsule (100 mg total) by mouth 2 (two) times daily as needed for mild constipation or moderate constipation. 10 capsule 0  ? valACYclovir (VALTREX) 1000 MG tablet Take 1 tablet (1,000 mg total) by mouth daily. 90 tablet 0  ? promethazine (PHENERGAN) 12.5 MG tablet Take 1 tablet (12.5 mg total) by mouth every 6 (six) hours as needed for up to 3 days for nausea or vomiting. 12 tablet 0  ? ?No current facility-administered medications for this visit.  ? ? ?Allergies  ?Allergen Reactions  ? Codeine Other (See Comments)  ?  Pt states this medication causes her bp to rise.  ?  ? Decongestant [Oxymetazoline] Other (See Comments)  ?  Pt states this medication causes her bp to drop.  ?  ? Percocet [Oxycodone-Acetaminophen] Other (See Comments)  ?  hallucinations.  ?  ? Latex Rash  ? ? ?Social History  ? ?Socioeconomic History  ? Marital status: Married  ?  Spouse name: Not on file  ? Number of children: 2  ? Years of education: Not on file  ? Highest education level: Not on file  ?Occupational History  ? Not on file  ?Tobacco Use  ? Smoking status: Never  ? Smokeless tobacco: Never  ? Tobacco comments:  ?  never used product  ?Vaping Use  ? Vaping Use: Never used  ?Substance and Sexual Activity  ? Alcohol use: No  ?  Alcohol/week: 0.0 standard drinks  ? Drug use: No  ? Sexual activity: Yes  ?  Partners: Male  ?  Birth control/protection: None  ?Other Topics Concern  ? Not on file  ?Social History Narrative  ? Not on file  ? ?Social Determinants of Health  ? ?Financial Resource Strain: Not on file   ?Food Insecurity: Not on file  ?Transportation Needs: Not on file  ?Physical Activity: Not on file  ?Stress: Not on file  ?Social Connections: Not on file  ?Intimate Partner Violence: Not on file  ? ? ?ROS ?Per hpi  ? ?Objective  ? ?Vitals as reported by the patient: ?Today's Vitals  ? 07/26/21 0841  ?Weight: 148 lb (67.1 kg)  ? ? ?Jon Gillslexis was seen today for nasal congestion. ? ?Diagnoses and all orders for this visit: ? ?Acute bacterial sinusitis ?-     amoxicillin (AMOXIL) 875 MG tablet; Take 1 tablet (875 mg total) by mouth 2 (two) times daily for 10 days. ?-     azelastine (ASTELIN) 0.1 % nasal spray; Place 1 spray into both nostrils 2 (two) times daily. Use in each nostril as directed ? ? ? ?  PLAN ?Consistent with previous bacterial infections. Will give abx and azelastine. ?Avoid steroid use given NSVT and worsening of this with steroids in the past. ?Return if worsening or failing to improve. ?Patient encouraged to call clinic with any questions, comments, or concerns. ? ?I discussed the assessment and treatment plan with the patient. The patient was provided an opportunity to ask questions and all were answered. The patient agreed with the plan and demonstrated an understanding of the instructions. ?  ?The patient was advised to call back or seek an in-person evaluation if the symptoms worsen or if the condition fails to improve as anticipated. ? ?I provided 14 minutes of non-face-to-face time during this encounter. ? ?Janeece Agee, NP ?

## 2021-07-26 NOTE — Patient Instructions (Signed)
° ° ° °  If you have lab work done today you will be contacted with your lab results within the next 2 weeks.  If you have not heard from us then please contact us. The fastest way to get your results is to register for My Chart. ° ° °IF you received an x-ray today, you will receive an invoice from [facility]. Please contact [facility] at [phone] with questions or concerns regarding your invoice.  ° °IF you received labwork today, you will receive an invoice from [facility]. Please contact [facility] at [phone] with questions or concerns regarding your invoice.  ° °Our billing staff will not be able to assist you with questions regarding bills from these companies. ° °You will be contacted with the lab results as soon as they are available. The fastest way to get your results is to activate your My Chart account. Instructions are located on the last page of this paperwork. If you have not heard from us regarding the results in 2 weeks, please contact this office. °  ° ° ° °

## 2021-08-19 IMAGING — CT CT ABD-PELV W/ CM
2 of 4 series · 16 of 46 positions shown, 18 images · IV contrast (APPLIED)
Comparison: CT 07/12/2020

CLINICAL DATA: Right lower quadrant pain

EXAM:
CT ABDOMEN AND PELVIS WITH CONTRAST
TECHNIQUE: Multidetector CT imaging of the abdomen and pelvis was performed
using the standard protocol following bolus administration of
intravenous contrast.
CONTRAST:  80mL OMNIPAQUE IOHEXOL 300 MG/ML  SOLN

[Series 2: abd pel w · axial · 0.72mm/px · z∈[+825,+1240]mm · 13 of 91 slices shown, 15 images]
[im 4/91  soft-tissue]
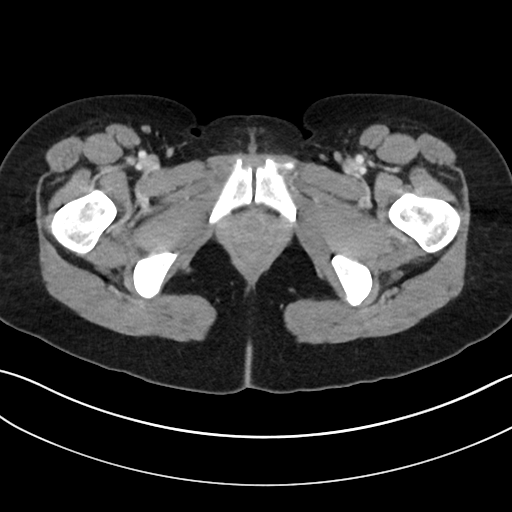
[im 4/91  bone]
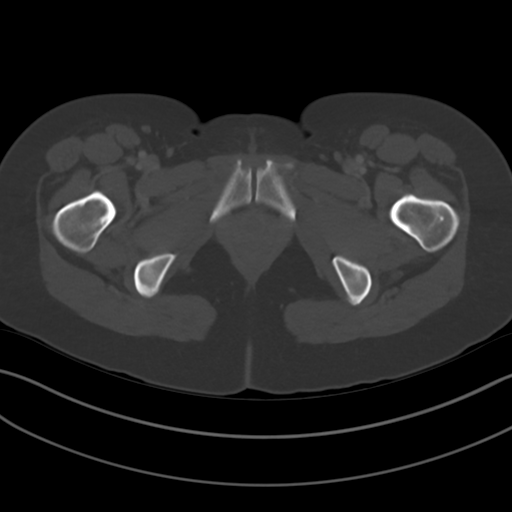
[im 12/91  soft-tissue]
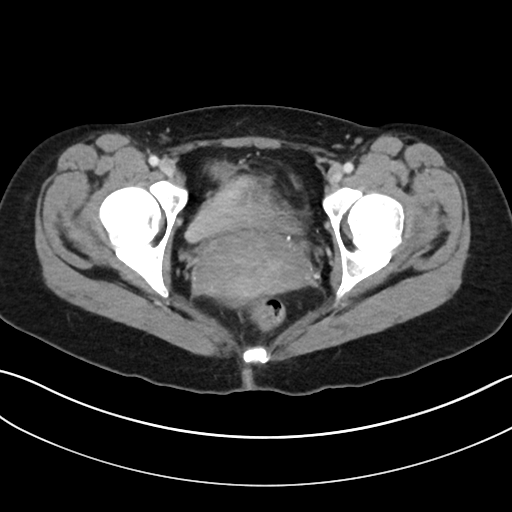
[im 19/91  soft-tissue]
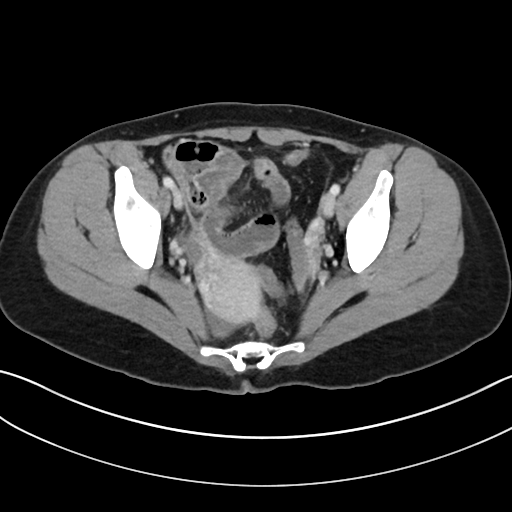
[im 27/91  soft-tissue]
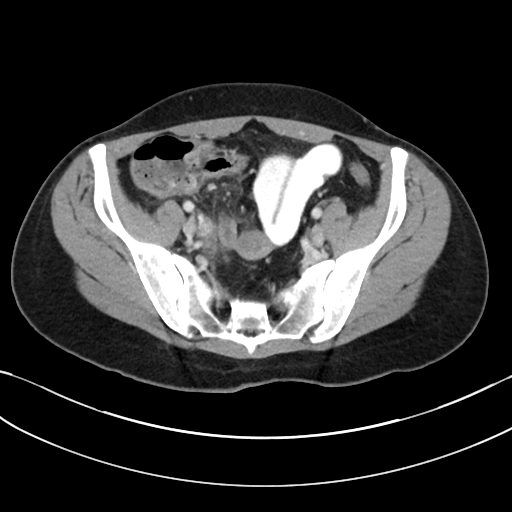
[im 31/91  soft-tissue]
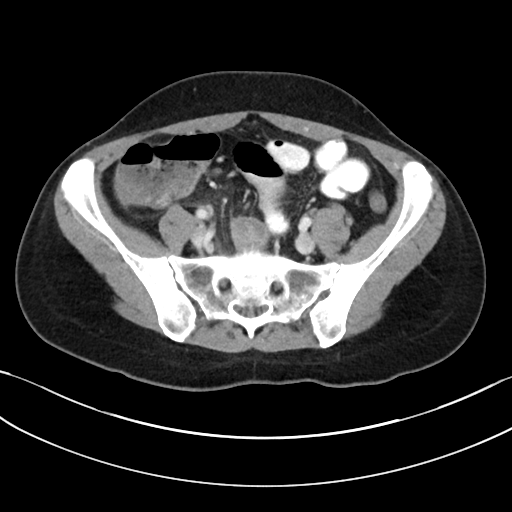
[im 38/91  soft-tissue]
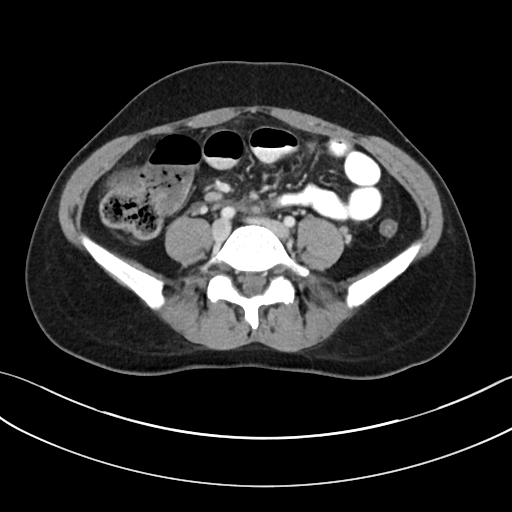
[im 46/91  soft-tissue]
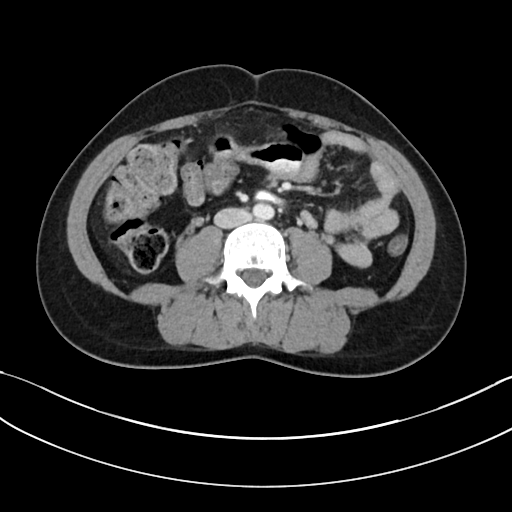
[im 53/91  soft-tissue]
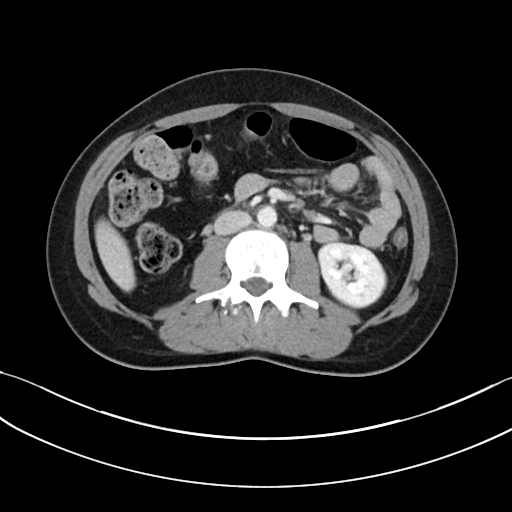
[im 61/91  soft-tissue]
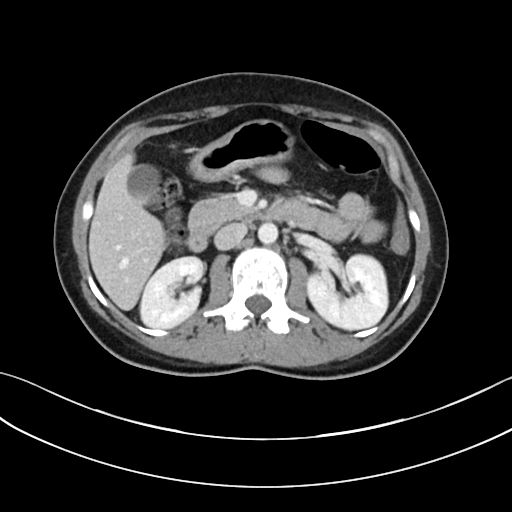
[im 61/91  bone]
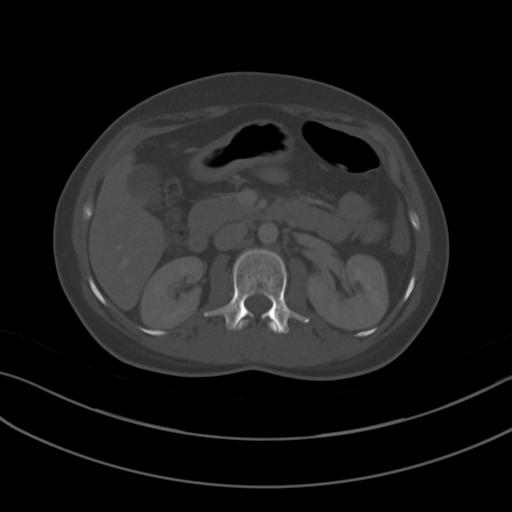
[im 64/91  soft-tissue]
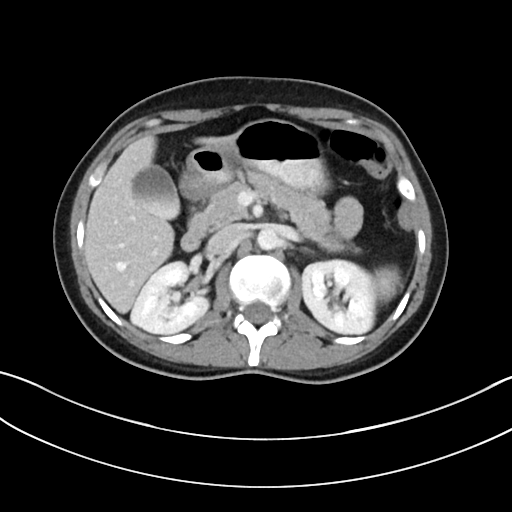
[im 72/91  soft-tissue]
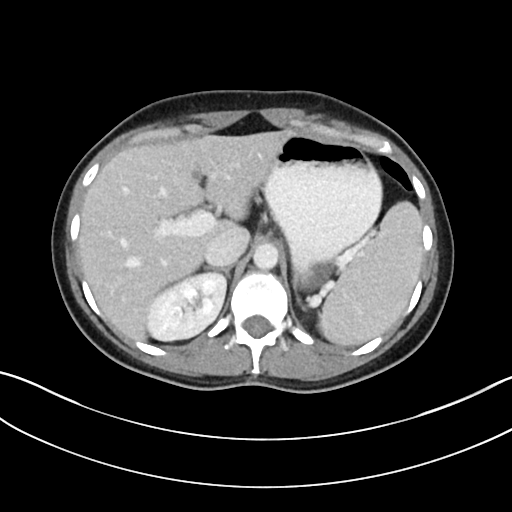
[im 79/91  soft-tissue]
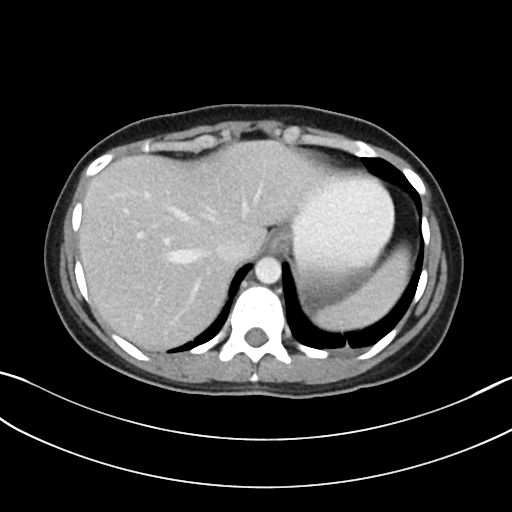
[im 87/91  soft-tissue]
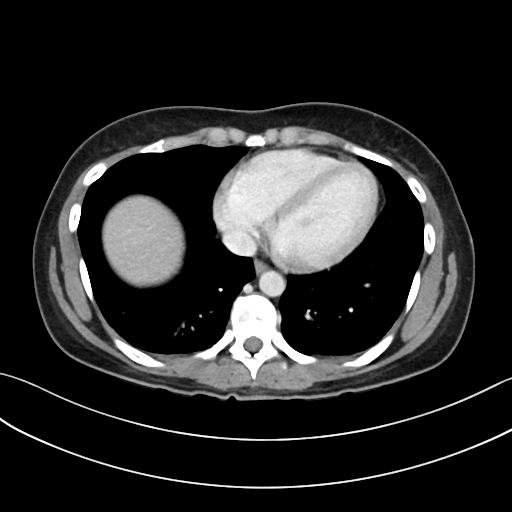

[Series 5: coronal · coronal · 0.78mm/px · 3 of 89 slices shown]
[im 30/89  soft-tissue]
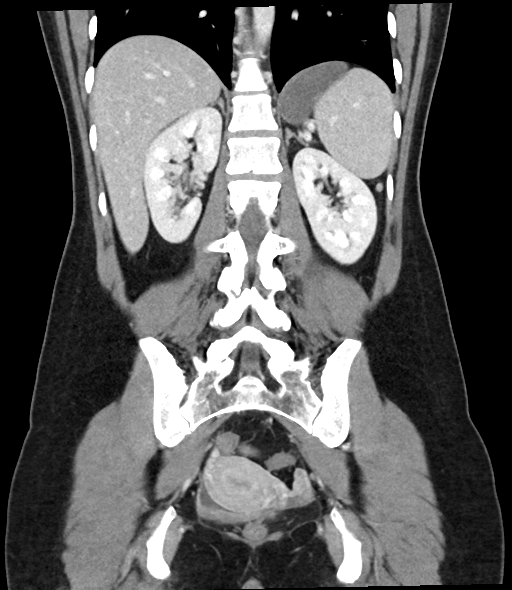
[im 40/89  soft-tissue]
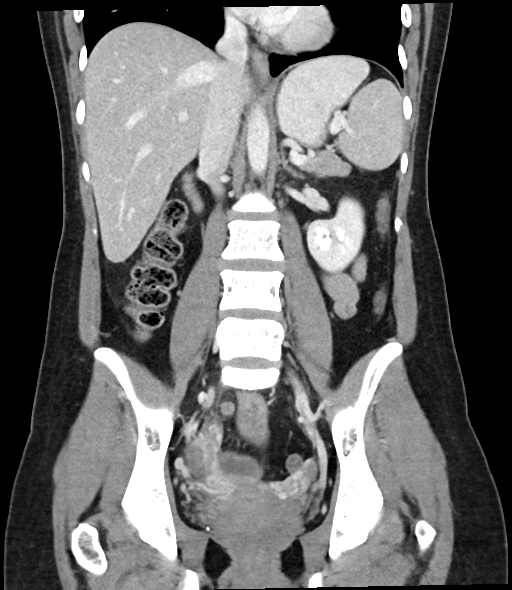
[im 49/89  soft-tissue]
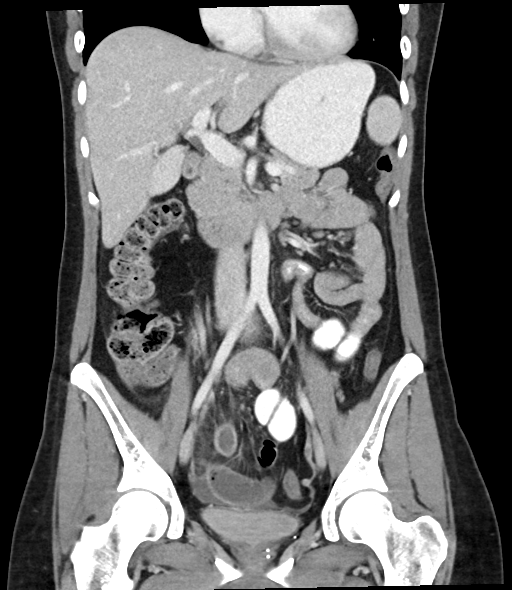

[16 of 46 positions shown; findings below may reference images not displayed]

FINDINGS: Lower chest: No acute abnormality.

Hepatobiliary: No focal hepatic abnormality or biliary dilatation.
Hyperdensity in the gallbladder which may be due to hyperdense
sludge or vicarious contrast excretion.

Pancreas: Unremarkable. No pancreatic ductal dilatation or
surrounding inflammatory changes.

Spleen: Normal in size without focal abnormality.

Adrenals/Urinary Tract: Adrenal glands are normal. No
hydronephrosis. Punctate stones in the right kidney. The bladder
contains dilute contrast.

Stomach/Bowel: Stomach nonenlarged. Fluid filled borderline
distended small bowel in the pelvis likely due to local ileus. Mild
terminal ileal wall thickening. Abnormal appendix. Appendix is
dilated up to 17 mm. There are at least 2 appendicoliths at the
origin of the appendix. Moderate periappendiceal soft tissue
stranding.

Vascular/Lymphatic: No significant vascular findings are present. No
enlarged abdominal or pelvic lymph nodes.

Reproductive: Uterus and bilateral adnexa are unremarkable.

Other: No free air.  Small free fluid in the pelvis.

Musculoskeletal: No acute or significant osseous findings.
IMPRESSION: 1. Findings consistent with acute appendicitis.

Appendix: Location: Right lower quadrant

Diameter: 17 mm

Appendicolith: At least 2 appendicoliths in the proximal lumen
measuring up to 9 mm.

Mucosal hyper-enhancement: Positive

Extraluminal gas: Negative

Periappendiceal collection: Negative

2. Small free fluid in the pelvis. Mild wall thickening of terminal
ileum with borderline fluid distension, likely due to reactive
ileitis and local ileus

3.  Punctate stones in the right kidney without obstruction.

## 2021-08-23 ENCOUNTER — Ambulatory Visit (INDEPENDENT_AMBULATORY_CARE_PROVIDER_SITE_OTHER): Payer: BC Managed Care – PPO | Admitting: Family Medicine

## 2021-08-23 ENCOUNTER — Encounter: Payer: Self-pay | Admitting: Family Medicine

## 2021-08-23 VITALS — BP 120/72 | HR 79 | Temp 98.1°F | Resp 16 | Ht 64.5 in | Wt 157.8 lb

## 2021-08-23 DIAGNOSIS — E663 Overweight: Secondary | ICD-10-CM | POA: Diagnosis not present

## 2021-08-23 DIAGNOSIS — Z0000 Encounter for general adult medical examination without abnormal findings: Secondary | ICD-10-CM

## 2021-08-23 LAB — HEPATIC FUNCTION PANEL
ALT: 17 U/L (ref 0–35)
AST: 15 U/L (ref 0–37)
Albumin: 4.7 g/dL (ref 3.5–5.2)
Alkaline Phosphatase: 48 U/L (ref 39–117)
Bilirubin, Direct: 0.1 mg/dL (ref 0.0–0.3)
Total Bilirubin: 0.5 mg/dL (ref 0.2–1.2)
Total Protein: 7.4 g/dL (ref 6.0–8.3)

## 2021-08-23 LAB — TSH: TSH: 1.55 u[IU]/mL (ref 0.35–5.50)

## 2021-08-23 LAB — CBC WITH DIFFERENTIAL/PLATELET
Basophils Absolute: 0 10*3/uL (ref 0.0–0.1)
Basophils Relative: 0.9 % (ref 0.0–3.0)
Eosinophils Absolute: 0.1 10*3/uL (ref 0.0–0.7)
Eosinophils Relative: 1.6 % (ref 0.0–5.0)
HCT: 38.8 % (ref 36.0–46.0)
Hemoglobin: 13 g/dL (ref 12.0–15.0)
Lymphocytes Relative: 37.4 % (ref 12.0–46.0)
Lymphs Abs: 1.6 10*3/uL (ref 0.7–4.0)
MCHC: 33.6 g/dL (ref 30.0–36.0)
MCV: 87.6 fl (ref 78.0–100.0)
Monocytes Absolute: 0.4 10*3/uL (ref 0.1–1.0)
Monocytes Relative: 9.5 % (ref 3.0–12.0)
Neutro Abs: 2.2 10*3/uL (ref 1.4–7.7)
Neutrophils Relative %: 50.6 % (ref 43.0–77.0)
Platelets: 190 10*3/uL (ref 150.0–400.0)
RBC: 4.43 Mil/uL (ref 3.87–5.11)
RDW: 14.5 % (ref 11.5–15.5)
WBC: 4.3 10*3/uL (ref 4.0–10.5)

## 2021-08-23 LAB — BASIC METABOLIC PANEL
BUN: 13 mg/dL (ref 6–23)
CO2: 26 mEq/L (ref 19–32)
Calcium: 9.5 mg/dL (ref 8.4–10.5)
Chloride: 103 mEq/L (ref 96–112)
Creatinine, Ser: 0.88 mg/dL (ref 0.40–1.20)
GFR: 87.57 mL/min (ref >60.00)
Glucose, Bld: 73 mg/dL (ref 70–99)
Potassium: 4 mEq/L (ref 3.5–5.1)
Sodium: 137 mEq/L (ref 135–145)

## 2021-08-23 LAB — LIPID PANEL
Cholesterol: 220 mg/dL — ABNORMAL HIGH (ref 0–200)
HDL: 72.3 mg/dL (ref >39.00)
LDL Cholesterol: 123 mg/dL — ABNORMAL HIGH (ref 0–99)
NonHDL: 147.47
Total CHOL/HDL Ratio: 3
Triglycerides: 121 mg/dL (ref 0.0–149.0)
VLDL: 24.2 mg/dL (ref 0.0–40.0)

## 2021-08-23 LAB — VITAMIN D 25 HYDROXY (VIT D DEFICIENCY, FRACTURES): VITD: 36.24 ng/mL (ref 30.00–100.00)

## 2021-08-23 NOTE — Progress Notes (Signed)
? ?  Subjective:  ? ? Patient ID: Jessica Diaz, female    DOB: Dec 17, 1990, 31 y.o.   MRN: [id number] ? ?HPI ?CPE- UTD on pap, Tdap.  No concerns today. ? ?Health Maintenance  ?Topic Date Due  ? Hepatitis C Screening  Never done  ? INFLUENZA VACCINE  11/13/2021  ? PAP SMEAR-Modifier  07/27/2023  ? TETANUS/TDAP  09/04/2024  ? HIV Screening  Completed  ? HPV VACCINES  Aged Out  ? COVID-19 Vaccine  Discontinued  ?  ? ? ?Review of Systems ?Patient reports no vision/ hearing changes, adenopathy,fever, persistant/recurrent hoarseness , swallowing issues, chest pain, palpitations, edema, persistant/recurrent cough, hemoptysis, dyspnea (rest/exertional/paroxysmal nocturnal), gastrointestinal bleeding (melena, rectal bleeding), abdominal pain, significant heartburn, bowel changes, GU symptoms (dysuria, hematuria, incontinence), Gyn symptoms (abnormal  bleeding, pain),  syncope, focal weakness, memory loss, numbness & tingling, skin/hair/nail changes, abnormal bruising or bleeding, anxiety, or depression.  ? ?+ 10 lb weight gain ?   ?Objective:  ? Physical Exam ?General Appearance:    Alert, cooperative, no distress, appears stated age  ?Head:    Normocephalic, without obvious abnormality, atraumatic  ?Eyes:    PERRL, conjunctiva/corneas clear, EOM's intact both eyes  ?Ears:    Normal TM's and external ear canals, both ears  ?Nose:   Nares normal, septum midline, mucosa normal, no drainage  ?  or sinus tenderness  ?Throat:   Lips, mucosa, and tongue normal; teeth and gums normal  ?Neck:   Supple, symmetrical, trachea midline, no adenopathy;  ?  Thyroid: no enlargement/tenderness/nodules  ?Back:     Symmetric, no curvature, ROM normal, no CVA tenderness  ?Lungs:     Clear to auscultation bilaterally, respirations unlabored  ?Chest Wall:    No tenderness or deformity  ? Heart:    Regular rate and rhythm, S1 and S2 normal, no murmur, rub ?  or gallop  ?Breast Exam:    Deferred to GYN  ?Abdomen:     Soft, non-tender, bowel sounds  active all four quadrants,  ?  no masses, no organomegaly  ?Genitalia:    Deferred to GYN  ?Rectal:    ?Extremities:   Extremities normal, atraumatic, no cyanosis or edema  ?Pulses:   2+ and symmetric all extremities  ?Skin:   Skin color, texture, turgor normal, no rashes or lesions  ?Lymph nodes:   Cervical, supraclavicular, and axillary nodes normal  ?Neurologic:   CNII-XII intact, normal strength, sensation and reflexes  ?  throughout  ?  ? ? ? ?   ?Assessment & Plan:  ? ? ?

## 2021-08-23 NOTE — Patient Instructions (Signed)
Follow up in 1 year or as needed ?We'll notify you of your lab results and make any changes if needed ?Continue to work on healthy diet and regular exercise- you can do it! ?Call with any questions or concerns ?Stay Safe!  Stay Healthy! ?Happy Mother's Day!!! ?

## 2021-08-23 NOTE — Assessment & Plan Note (Signed)
Pt's PE WNL.  UTD on pap, Tdap.  Check labs.  Anticipatory guidance provided.  

## 2021-08-24 ENCOUNTER — Telehealth: Payer: Self-pay

## 2021-08-24 NOTE — Telephone Encounter (Signed)
-----   Message from Midge Minium, MD sent at 08/24/2021  3:00 PM EDT ----- ?Labs look good!  No changes at this time ?

## 2021-08-27 NOTE — Telephone Encounter (Signed)
Patient called back. Advised patient of message below. No questions. ?

## 2021-09-06 IMAGING — CT CT ABD-PELV W/ CM
2 of 4 series · 16 of 46 positions shown, 18 images · IV contrast (Omnipaque)
Comparison: 10/16/2020

CLINICAL DATA: Fever since appendectomy 10/16/2020.

EXAM:
CT ABDOMEN AND PELVIS WITH CONTRAST
TECHNIQUE: Multidetector CT imaging of the abdomen and pelvis was performed
using the standard protocol following bolus administration of
intravenous contrast.
CONTRAST:  80mL OMNIPAQUE IOHEXOL 300 MG/ML  SOLN

[Series 2: axial st · axial · 0.91mm/px · z∈[-450,-24]mm · 13 of 95 slices shown, 15 images]
[im 5/95  soft-tissue]
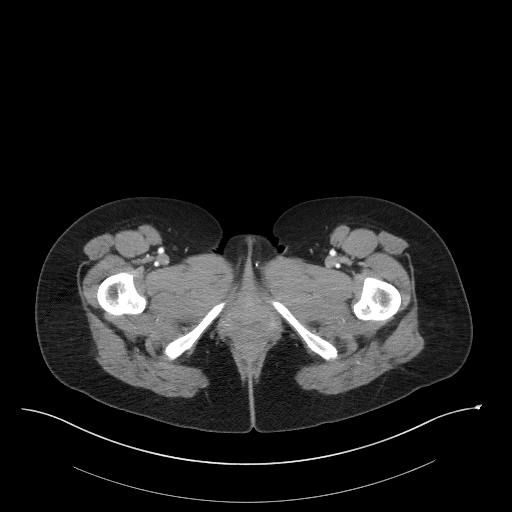
[im 5/95  bone]
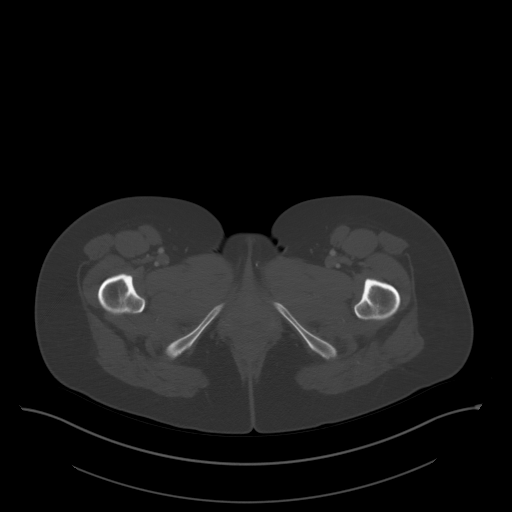
[im 13/95  soft-tissue]
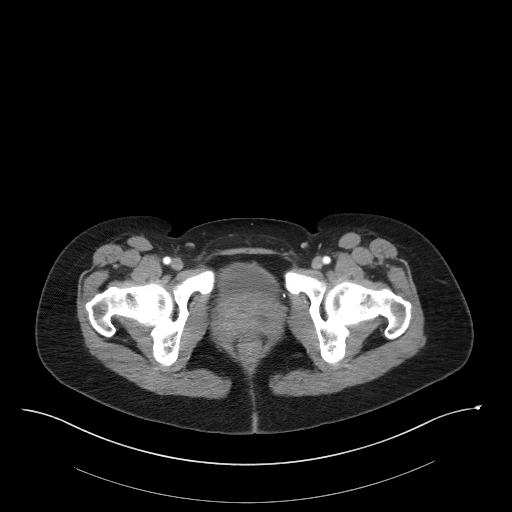
[im 21/95  soft-tissue]
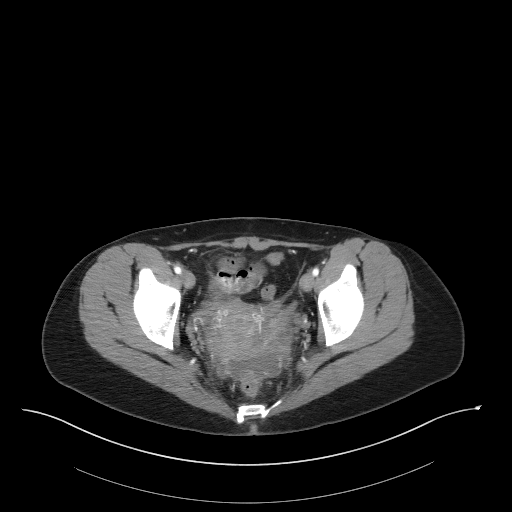
[im 25/95  soft-tissue]
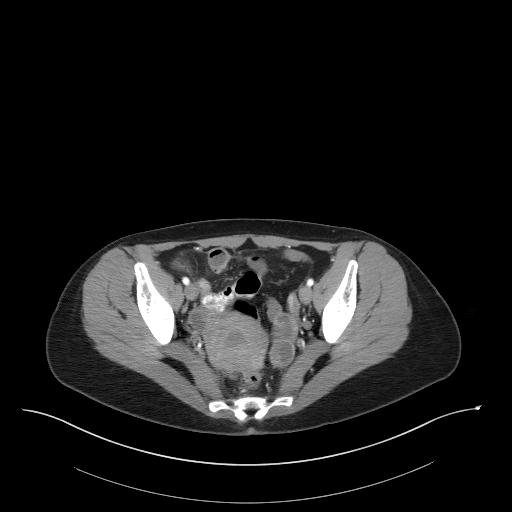
[im 33/95  soft-tissue]
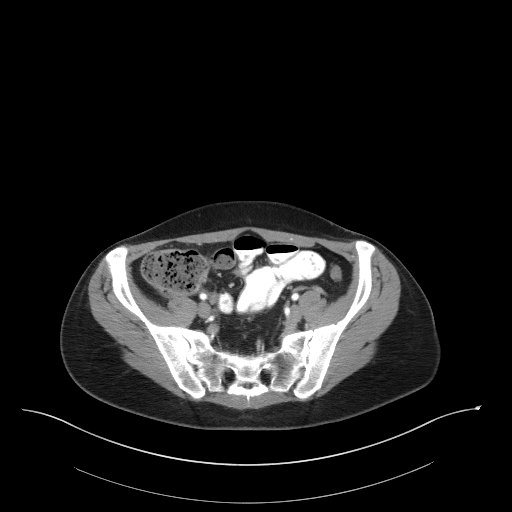
[im 41/95  soft-tissue]
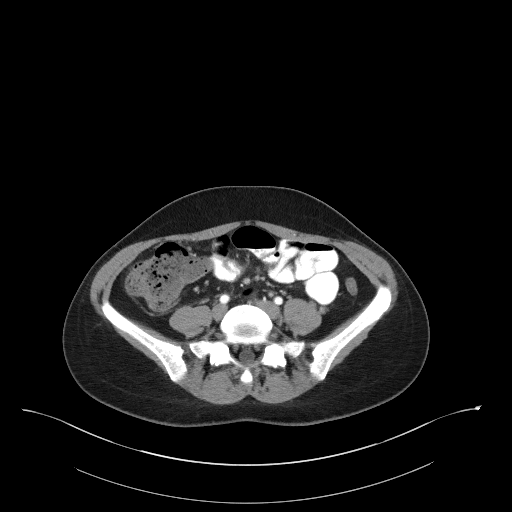
[im 50/95  soft-tissue]
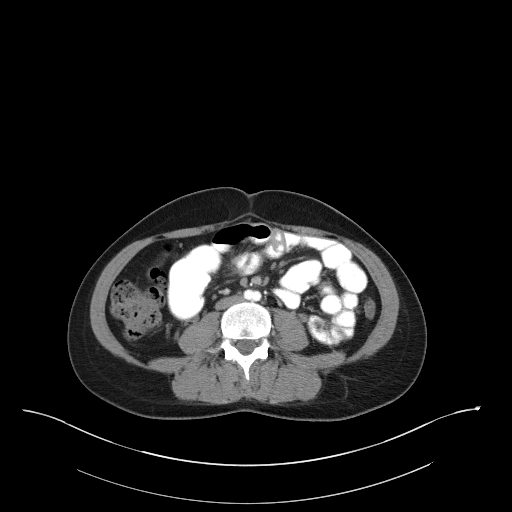
[im 54/95  soft-tissue]
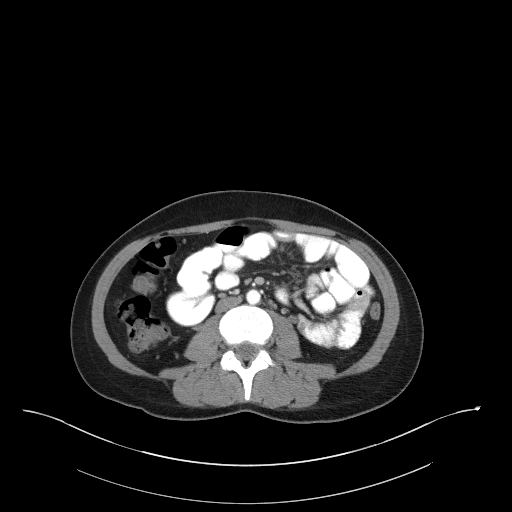
[im 62/95  soft-tissue]
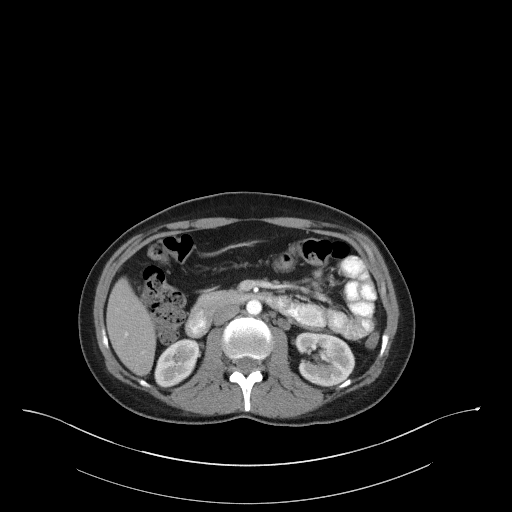
[im 62/95  bone]
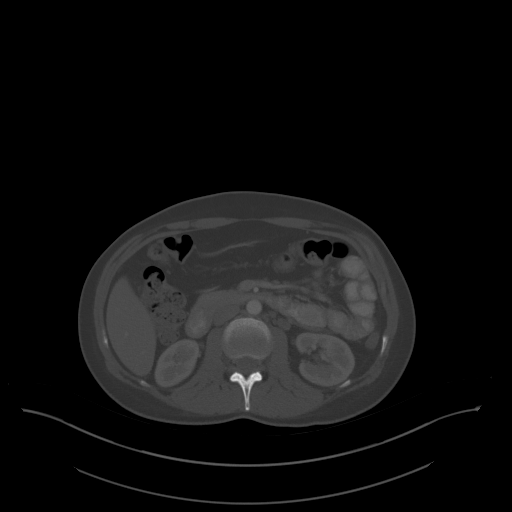
[im 70/95  soft-tissue]
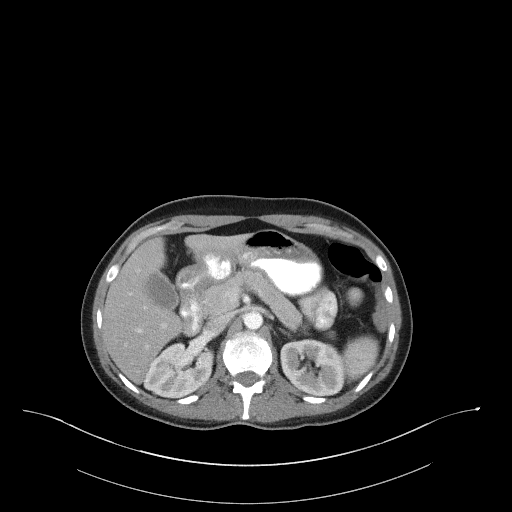
[im 74/95  soft-tissue]
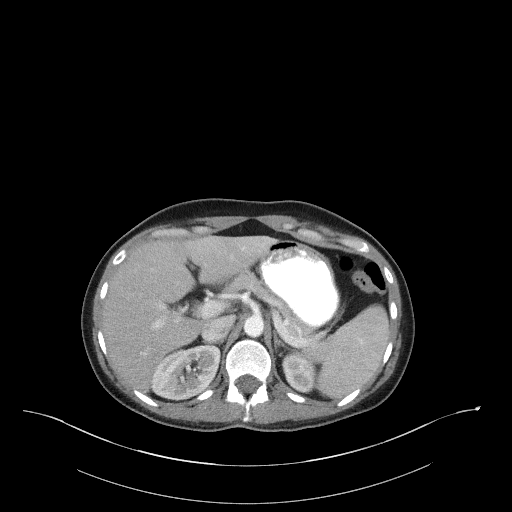
[im 82/95  soft-tissue]
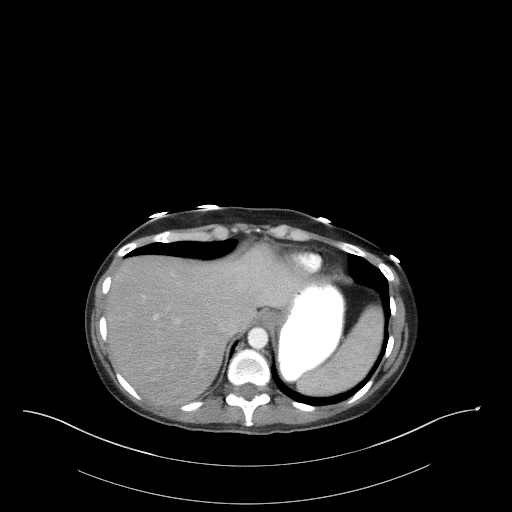
[im 90/95  soft-tissue]
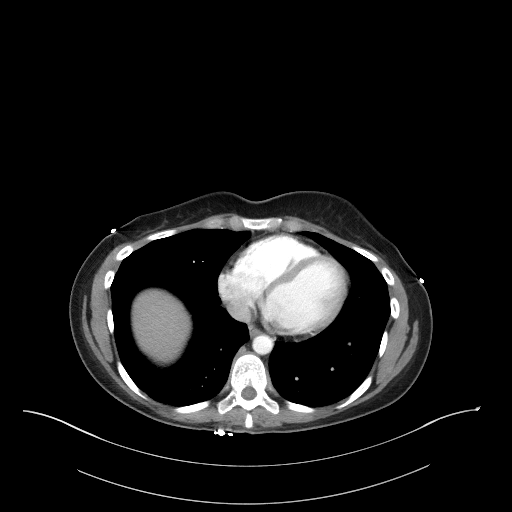

[Series 5: coronal st · coronal · 0.70mm/px · 3 of 80 slices shown]
[im 27/80  soft-tissue]
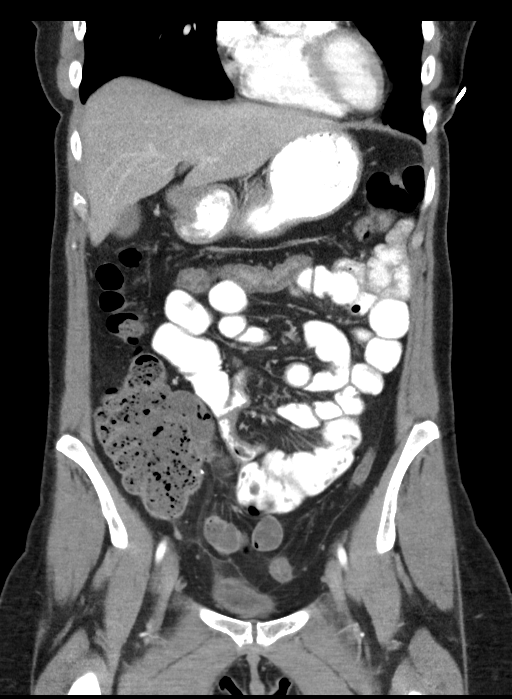
[im 36/80  soft-tissue]
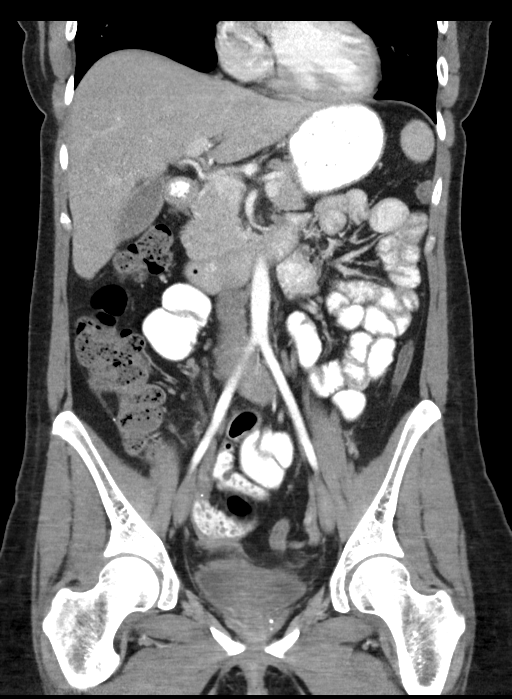
[im 44/80  soft-tissue]
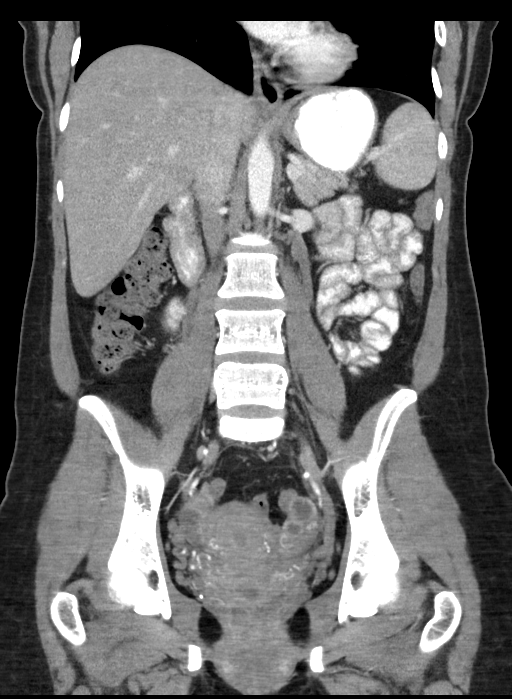

[16 of 46 positions shown; findings below may reference images not displayed]

FINDINGS: Lower chest: No acute abnormality.

Hepatobiliary: No focal liver abnormality is seen. No gallstones,
gallbladder wall thickening, or biliary dilatation.

Pancreas: Unremarkable. No pancreatic ductal dilatation or
surrounding inflammatory changes.

Spleen: Normal in size without focal abnormality.

Adrenals/Urinary Tract: Adrenal glands are unremarkable. Kidneys are
normal, without renal calculi, focal lesion, or hydronephrosis.
Bladder is unremarkable.

Stomach/Bowel: Stomach is within normal limits. No evidence of bowel
wall thickening, distention, or inflammatory changes. Interval
appendectomy. Mild haziness in the mesenteric fat adjacent to the
cecum likely postsurgical. No focal fluid collection to suggest an
abscess. Moderate amount of stool in the ascending colon.

Vascular/Lymphatic: No significant vascular findings are present. No
enlarged abdominal or pelvic lymph nodes.

Reproductive: Uterus is normal. Tubular fluid-filled structure in
the left adnexal region most consistent with hydrosalpinx.

Other: No abdominal wall hernia or abnormality. No abdominopelvic
ascites.

Musculoskeletal: No acute osseous abnormality. No aggressive osseous
lesion.
IMPRESSION: 1. Interval appendectomy. Mild haziness in the mesenteric fat
adjacent to the cecum likely postsurgical. No focal fluid collection
to suggest an abscess.
2. Left hydrosalpinx.
3. Moderate amount of stool in the ascending colon.

## 2021-09-25 ENCOUNTER — Other Ambulatory Visit: Payer: Self-pay | Admitting: Family Medicine

## 2021-09-25 DIAGNOSIS — B009 Herpesviral infection, unspecified: Secondary | ICD-10-CM

## 2021-12-27 ENCOUNTER — Other Ambulatory Visit: Payer: Self-pay | Admitting: Family Medicine

## 2021-12-27 DIAGNOSIS — B009 Herpesviral infection, unspecified: Secondary | ICD-10-CM

## 2022-06-02 ENCOUNTER — Other Ambulatory Visit: Payer: Self-pay | Admitting: Family Medicine

## 2022-06-02 DIAGNOSIS — B009 Herpesviral infection, unspecified: Secondary | ICD-10-CM

## 2022-08-28 ENCOUNTER — Encounter: Payer: Self-pay | Admitting: Family Medicine

## 2022-08-28 ENCOUNTER — Ambulatory Visit (INDEPENDENT_AMBULATORY_CARE_PROVIDER_SITE_OTHER): Payer: Managed Care, Other (non HMO) | Admitting: Family Medicine

## 2022-08-28 VITALS — BP 128/80 | HR 74 | Temp 98.1°F | Resp 17 | Ht 64.5 in | Wt 172.1 lb

## 2022-08-28 DIAGNOSIS — E663 Overweight: Secondary | ICD-10-CM

## 2022-08-28 DIAGNOSIS — Z0000 Encounter for general adult medical examination without abnormal findings: Secondary | ICD-10-CM | POA: Diagnosis not present

## 2022-08-28 LAB — LIPID PANEL
Cholesterol: 206 mg/dL — ABNORMAL HIGH (ref 0–200)
HDL: 61 mg/dL (ref >39.00)
LDL Cholesterol: 119 mg/dL — ABNORMAL HIGH (ref 0–99)
NonHDL: 144.68
Total CHOL/HDL Ratio: 3
Triglycerides: 130 mg/dL (ref 0.0–149.0)
VLDL: 26 mg/dL (ref 0.0–40.0)

## 2022-08-28 LAB — BASIC METABOLIC PANEL WITH GFR
BUN: 13 mg/dL (ref 6–23)
CO2: 27 meq/L (ref 19–32)
Calcium: 9.8 mg/dL (ref 8.4–10.5)
Chloride: 104 meq/L (ref 96–112)
Creatinine, Ser: 0.87 mg/dL (ref 0.40–1.20)
GFR: 88.15 mL/min
Glucose, Bld: 71 mg/dL (ref 70–99)
Potassium: 4.1 meq/L (ref 3.5–5.1)
Sodium: 139 meq/L (ref 135–145)

## 2022-08-28 LAB — HEPATIC FUNCTION PANEL
ALT: 14 U/L (ref 0–35)
AST: 17 U/L (ref 0–37)
Albumin: 4.4 g/dL (ref 3.5–5.2)
Alkaline Phosphatase: 56 U/L (ref 39–117)
Bilirubin, Direct: 0.1 mg/dL (ref 0.0–0.3)
Total Bilirubin: 0.4 mg/dL (ref 0.2–1.2)
Total Protein: 7.4 g/dL (ref 6.0–8.3)

## 2022-08-28 LAB — TSH: TSH: 1.61 u[IU]/mL (ref 0.35–5.50)

## 2022-08-28 NOTE — Patient Instructions (Signed)
Follow up in 1 year or as needed We'll notify you of your lab results and make any changes if needed Continue to work on healthy diet and regular exercise- you can do it! Call with any questions or concerns Stay Safe!  Stay Healthy! Have a great summer!!! 

## 2022-08-28 NOTE — Assessment & Plan Note (Signed)
Pt's PE WNL w/ exception of BMI.  UTD on pap, Tdap.  Check labs.  Anticipatory guidance provided.  ?

## 2022-08-28 NOTE — Progress Notes (Signed)
   Subjective:    Patient ID: Jessica Diaz, female    DOB: 09-18-1990, 32 y.o.   MRN: [id number]  HPI CPE- UTD on pap, Tdap.    Patient Care Team    Relationship Specialty Notifications Start End  Sheliah Hatch, MD PCP - General Family Medicine  09/30/11     Health Maintenance  Topic Date Due   INFLUENZA VACCINE  11/14/2022   PAP SMEAR-Modifier  07/27/2023   DTaP/Tdap/Td (2 - Td or Tdap) 09/04/2024   HIV Screening  Completed   HPV VACCINES  Aged Out   COVID-19 Vaccine  Discontinued   Hepatitis C Screening  Discontinued      Review of Systems Patient reports no vision/ hearing changes, adenopathy,fever,  persistant/recurrent hoarseness , swallowing issues, chest pain, palpitations, edema, persistant/recurrent cough, hemoptysis, dyspnea (rest/exertional/paroxysmal nocturnal), gastrointestinal bleeding (melena, rectal bleeding), abdominal pain, significant heartburn, bowel changes, GU symptoms (dysuria, hematuria, incontinence), Gyn symptoms (abnormal  bleeding, pain),  syncope, focal weakness, memory loss, numbness & tingling, skin/hair/nail changes, abnormal bruising or bleeding, anxiety, or depression.   + 14 lb weight gain    Objective:   Physical Exam General Appearance:    Alert, cooperative, no distress, appears stated age  Head:    Normocephalic, without obvious abnormality, atraumatic  Eyes:    PERRL, conjunctiva/corneas clear, EOM's intact both eyes  Ears:    Normal TM's and external ear canals, both ears  Nose:   Nares normal, septum midline, mucosa normal, no drainage    or sinus tenderness  Throat:   Lips, mucosa, and tongue normal; teeth and gums normal  Neck:   Supple, symmetrical, trachea midline, no adenopathy;    Thyroid: no enlargement/tenderness/nodules  Back:     Symmetric, no curvature, ROM normal, no CVA tenderness  Lungs:     Clear to auscultation bilaterally, respirations unlabored  Chest Wall:    No tenderness or deformity   Heart:    Regular  rate and rhythm, S1 and S2 normal, no murmur, rub   or gallop  Breast Exam:    Deferred to GYN  Abdomen:     Soft, non-tender, bowel sounds active all four quadrants,    no masses, no organomegaly  Genitalia:    Deferred to GYN  Rectal:    Extremities:   Extremities normal, atraumatic, no cyanosis or edema  Pulses:   2+ and symmetric all extremities  Skin:   Skin color, texture, turgor normal, no rashes or lesions  Lymph nodes:   Cervical, supraclavicular, and axillary nodes normal  Neurologic:   CNII-XII intact, normal strength, sensation and reflexes    throughout          Assessment & Plan:

## 2022-08-29 ENCOUNTER — Telehealth: Payer: Self-pay

## 2022-08-29 NOTE — Telephone Encounter (Signed)
Left lab results on pt VM 

## 2022-08-29 NOTE — Telephone Encounter (Signed)
-----   Message from Sheliah Hatch, MD sent at 08/29/2022  7:37 AM EDT ----- Labs look great!  No changes at this time

## 2022-09-06 ENCOUNTER — Other Ambulatory Visit: Payer: Self-pay | Admitting: Family Medicine

## 2022-09-06 DIAGNOSIS — B009 Herpesviral infection, unspecified: Secondary | ICD-10-CM

## 2022-12-24 ENCOUNTER — Other Ambulatory Visit: Payer: Self-pay | Admitting: Family Medicine

## 2022-12-24 DIAGNOSIS — B009 Herpesviral infection, unspecified: Secondary | ICD-10-CM

## 2023-03-22 ENCOUNTER — Other Ambulatory Visit: Payer: Self-pay | Admitting: Family Medicine

## 2023-03-22 DIAGNOSIS — B009 Herpesviral infection, unspecified: Secondary | ICD-10-CM

## 2023-04-07 NOTE — Telephone Encounter (Signed)
Patient is calling back in for her medication (VALTREX) 1000 MG tablet  she called the pharmacy and they dont have the prescription yet she would like the medication called in

## 2023-04-11 ENCOUNTER — Other Ambulatory Visit: Payer: Self-pay

## 2023-04-11 DIAGNOSIS — B009 Herpesviral infection, unspecified: Secondary | ICD-10-CM

## 2023-04-11 MED ORDER — VALACYCLOVIR HCL 1 G PO TABS: 1000.0000 mg | ORAL_TABLET | Freq: Every day | ORAL | 0 refills | Status: DC

## 2023-04-11 NOTE — Telephone Encounter (Signed)
Copied from CRM [id number]. Topic: Clinical - Prescription Issue >> Apr 11, 2023  8:35 AM Adele Barthel wrote: Reason for CRM: Pt reports the valACYclovir (VALTREX) 1000 MG tablet prescription that has been faxed twice to her pharmacist has not gone through. Pharmacist is requesting a call from clinic to take verbal prescription request. [facility] number # [phone]

## 2023-07-07 ENCOUNTER — Other Ambulatory Visit: Payer: Self-pay | Admitting: Family Medicine

## 2023-07-07 DIAGNOSIS — B009 Herpesviral infection, unspecified: Secondary | ICD-10-CM

## 2023-10-09 DIAGNOSIS — I8393 Asymptomatic varicose veins of bilateral lower extremities: Secondary | ICD-10-CM | POA: Insufficient documentation

## 2023-11-02 ENCOUNTER — Other Ambulatory Visit: Payer: Self-pay | Admitting: Family Medicine

## 2023-11-02 DIAGNOSIS — B009 Herpesviral infection, unspecified: Secondary | ICD-10-CM

## 2024-01-23 ENCOUNTER — Ambulatory Visit: Payer: Self-pay | Admitting: Family Medicine

## 2024-01-23 ENCOUNTER — Encounter: Payer: Self-pay | Admitting: Family Medicine

## 2024-01-23 ENCOUNTER — Ambulatory Visit (INDEPENDENT_AMBULATORY_CARE_PROVIDER_SITE_OTHER): Admitting: Family Medicine

## 2024-01-23 VITALS — BP 120/70 | HR 80 | Temp 98.3°F | Ht 65.0 in | Wt 166.2 lb

## 2024-01-23 DIAGNOSIS — E663 Overweight: Secondary | ICD-10-CM | POA: Diagnosis not present

## 2024-01-23 DIAGNOSIS — Z0000 Encounter for general adult medical examination without abnormal findings: Secondary | ICD-10-CM | POA: Diagnosis not present

## 2024-01-23 LAB — CBC WITH DIFFERENTIAL/PLATELET
Basophils Absolute: 0 K/uL (ref 0.0–0.1)
Basophils Relative: 0.5 % (ref 0.0–3.0)
Eosinophils Absolute: 0.1 K/uL (ref 0.0–0.7)
Eosinophils Relative: 1.1 % (ref 0.0–5.0)
HCT: 42.4 % (ref 36.0–46.0)
Hemoglobin: 14.1 g/dL (ref 12.0–15.0)
Lymphocytes Relative: 34.1 % (ref 12.0–46.0)
Lymphs Abs: 1.9 K/uL (ref 0.7–4.0)
MCHC: 33.3 g/dL (ref 30.0–36.0)
MCV: 91.6 fl (ref 78.0–100.0)
Monocytes Absolute: 0.4 K/uL (ref 0.1–1.0)
Monocytes Relative: 6.9 % (ref 3.0–12.0)
Neutro Abs: 3.2 K/uL (ref 1.4–7.7)
Neutrophils Relative %: 57.4 % (ref 43.0–77.0)
Platelets: 202 K/uL (ref 150.0–400.0)
RBC: 4.63 Mil/uL (ref 3.87–5.11)
RDW: 13.5 % (ref 11.5–15.5)
WBC: 5.5 K/uL (ref 4.0–10.5)

## 2024-01-23 LAB — BASIC METABOLIC PANEL WITH GFR
BUN: 15 mg/dL (ref 6–23)
CO2: 25 meq/L (ref 19–32)
Calcium: 9.6 mg/dL (ref 8.4–10.5)
Chloride: 105 meq/L (ref 96–112)
Creatinine, Ser: 0.83 mg/dL (ref 0.40–1.20)
GFR: 92.35 mL/min (ref >60.00)
Glucose, Bld: 74 mg/dL (ref 70–99)
Potassium: 4 meq/L (ref 3.5–5.1)
Sodium: 139 meq/L (ref 135–145)

## 2024-01-23 LAB — HEPATIC FUNCTION PANEL
ALT: 11 U/L (ref 0–35)
AST: 14 U/L (ref 0–37)
Albumin: 4.8 g/dL (ref 3.5–5.2)
Alkaline Phosphatase: 46 U/L (ref 39–117)
Bilirubin, Direct: 0 mg/dL (ref 0.0–0.3)
Total Bilirubin: 0.5 mg/dL (ref 0.2–1.2)
Total Protein: 7.4 g/dL (ref 6.0–8.3)

## 2024-01-23 LAB — LIPID PANEL
Cholesterol: 213 mg/dL — ABNORMAL HIGH (ref 0–200)
HDL: 64.1 mg/dL (ref >39.00)
LDL Cholesterol: 127 mg/dL — ABNORMAL HIGH (ref 0–99)
NonHDL: 149.01
Total CHOL/HDL Ratio: 3
Triglycerides: 108 mg/dL (ref 0.0–149.0)
VLDL: 21.6 mg/dL (ref 0.0–40.0)

## 2024-01-23 LAB — TSH: TSH: 1.28 u[IU]/mL (ref 0.35–5.50)

## 2024-01-23 LAB — VITAMIN D 25 HYDROXY (VIT D DEFICIENCY, FRACTURES): VITD: 36.66 ng/mL (ref 30.00–100.00)

## 2024-01-23 NOTE — Assessment & Plan Note (Signed)
Pt's PE WNL.  UTD on pap, Tdap.  Declines flu.  Check labs.  Anticipatory guidance provided.

## 2024-01-23 NOTE — Patient Instructions (Signed)
Follow up in 1 year or as needed We'll notify you of your lab results and make any changes if needed Continue to work on healthy diet and regular exercise- you can do it!!! Call with any questions or concerns Stay Safe!  Stay Healthy! Hang in there!!! 

## 2024-01-23 NOTE — Progress Notes (Signed)
   Subjective:    Patient ID: Jessica Diaz, female    DOB: 1990/05/09, 33 y.o.   MRN: [id number]  HPI CPE- UTD on Tdap, pap.  Declines flu.  Patient Care Team    Relationship Specialty Notifications Start End  Mahlon Comer BRAVO, MD PCP - General Family Medicine  09/30/11     Health Maintenance  Topic Date Due   Hepatitis C Screening  Never done   Hepatitis B Vaccines 19-59 Average Risk (1 of 3 - 19+ 3-dose series) Never done   HPV VACCINES (1 - 3-dose SCDM series) Never done   Influenza Vaccine  07/13/2024 (Originally 11/14/2023)   DTaP/Tdap/Td (2 - Td or Tdap) 09/04/2024   Cervical Cancer Screening (HPV/Pap Cotest)  08/07/2025   HIV Screening  Completed   Pneumococcal Vaccine  Aged Out   Meningococcal B Vaccine  Aged Out   COVID-19 Vaccine  Discontinued      Review of Systems Patient reports no vision/ hearing changes, adenopathy,fever, weight change,  persistant/recurrent hoarseness , swallowing issues, chest pain, palpitations, edema, persistant/recurrent cough, hemoptysis, dyspnea (rest/exertional/paroxysmal nocturnal), gastrointestinal bleeding (melena, rectal bleeding), abdominal pain, significant heartburn, bowel changes, GU symptoms (dysuria, hematuria, incontinence), Gyn symptoms (abnormal  bleeding, pain), focal weakness, memory loss, skin/hair/nail changes, abnormal bruising or bleeding, anxiety, or depression.   + numbness/tingling of feet- following w/ vascular +syncope- following w/ cardiology    Objective:   Physical Exam General Appearance:    Alert, cooperative, no distress, appears stated age  Head:    Normocephalic, without obvious abnormality, atraumatic  Eyes:    PERRL, conjunctiva/corneas clear, EOM's intact both eyes  Ears:    Normal TM's and external ear canals, both ears  Nose:   Nares normal, septum midline, mucosa normal, no drainage    or sinus tenderness  Throat:   Lips, mucosa, and tongue normal; teeth and gums normal  Neck:   Supple,  symmetrical, trachea midline, no adenopathy;    Thyroid : no enlargement/tenderness/nodules  Back:     Symmetric, no curvature, ROM normal, no CVA tenderness  Lungs:     Clear to auscultation bilaterally, respirations unlabored  Chest Wall:    No tenderness or deformity   Heart:    Regular rate and rhythm, S1 and S2 normal, no murmur, rub   or gallop  Breast Exam:    Deferred to GYN  Abdomen:     Soft, non-tender, bowel sounds active all four quadrants,    no masses, no organomegaly  Genitalia:    Deferred to GYN  Rectal:    Extremities:   Extremities normal, atraumatic, no cyanosis or edema  Pulses:   2+ and symmetric all extremities  Skin:   Skin color, texture, turgor normal, no rashes or lesions  Lymph nodes:   Cervical, supraclavicular, and axillary nodes normal  Neurologic:   CNII-XII intact, normal strength, sensation and reflexes    throughout          Assessment & Plan:

## 2024-02-27 ENCOUNTER — Other Ambulatory Visit: Payer: Self-pay | Admitting: Family Medicine

## 2024-02-27 DIAGNOSIS — B009 Herpesviral infection, unspecified: Secondary | ICD-10-CM

## 2024-02-27 MED ORDER — VALACYCLOVIR HCL 1 G PO TABS: 1000.0000 mg | ORAL_TABLET | Freq: Every day | ORAL | 0 refills | Status: AC

## 2024-02-27 NOTE — Telephone Encounter (Signed)
 Copied from CRM [id number]. Topic: Clinical - Medication Refill >> Feb 27, 2024  9:12 AM Franky GRADE wrote: Medication: valACYclovir  (VALTREX ) 1000 MG tablet [[id number]]  Has the patient contacted their pharmacy? Yes, patient was seen on 01/23/2024 and informed the refill will be sent; however, when patient followed up with the pharmacy they had not received the prescription yet.  (Agent: If no, request that the patient contact the pharmacy for the refill. If patient does not wish to contact the pharmacy document the reason why and proceed with request.) (Agent: If yes, when and what did the pharmacy advise?)  This is the patient's preferred pharmacy:    [id number] - [place], [place] - [address] [facility] [place] [address] Phone: [phone] Fax: [phone]   Is this the correct pharmacy for this prescription? Yes If no, delete pharmacy and type the correct one.   Has the prescription been filled recently? No  Is the patient out of the medication? Yes  Has the patient been seen for an appointment in the last year OR does the patient have an upcoming appointment? Yes  Can we respond through MyChart? Yes  Agent: Please be advised that Rx refills may take up to 3 business days. We ask that you follow-up with your pharmacy.

## 2024-03-25 ENCOUNTER — Ambulatory Visit: Payer: Self-pay

## 2024-03-25 NOTE — Telephone Encounter (Signed)
 FYI Only or Action Required?: Action required by provider: request for appointment.  Patient was last seen in primary care on 01/23/2024 by Jessica Comer BRAVO, MD.  Called Nurse Triage reporting Dizziness and Headache.  Symptoms began several days ago.  Interventions attempted: OTC medications: tylenol .  Symptoms are: unchanged.  Triage Disposition: See Physician Within 24 Hours  Patient/caregiver understands and will follow disposition?: No, wishes to speak with PCP   Copied from CRM #[id number]. Topic: Clinical - Red Word Triage >> Mar 25, 2024  1:22 PM Burnard DEL wrote: Red Word that prompted transfer to Nurse Triage: ongoing headache,dizziness ,off balanced  Since last week  Comes and goes, doesn't fully go away, tylenol  ease off  Started Friday, felt faint Saturday  Fever chills n/v Reason for Disposition  [1] MODERATE dizziness (e.g., interferes with normal activities) AND [2] has NOT been evaluated by doctor (or NP/PA) for this  (Exception: Dizziness caused by heat exposure, sudden standing, or poor fluid intake.)  Answer Assessment - Initial Assessment Questions No available appts with pcp. Offered appt with alt prov, patient declines.  Patient requests video visit tomorrow after 1 PM; have to take child to doc appt.  Advised UC today and ED/911 if symptoms worsen: severe dizziness, faint, blurred vision, severe HA, weakness/ numbness one side of body, chest pain more 5 minutes.  Patient verbalized understanding.  1. DESCRIPTION: Describe your dizziness.     HA; constant; tylenol  5/10 Denies blurred vision Last night, vision looked wavy 2. LIGHTHEADED: Do you feel lightheaded? (e.g., somewhat faint, woozy, weak upon standing)     no 3. VERTIGO: Do you feel like either you or the room is spinning or tilting? (i.e., vertigo)     no 4. SEVERITY: How bad is it?  Do you feel like you are going to faint? Can you stand and walk?     yes 5. ONSET:  When did  the dizziness begin?     friday 6. AGGRAVATING FACTORS: Does anything make it worse? (e.g., standing, change in head position)     Moving around, driving  9. RECURRENT SYMPTOM: Have you had dizziness before? If Yes, ask: When was the last time? What happened that time?     yes 10. OTHER SYMPTOMS: Do you have any other symptoms? (e.g., fever, chest pain, vomiting, diarrhea, bleeding)       Denies Eating and drinking fine  Protocols used: Dizziness - Lightheadedness-A-AH

## 2024-03-25 NOTE — Telephone Encounter (Signed)
 LM to call back and discuss

## 2024-03-25 NOTE — Telephone Encounter (Signed)
 Unfortunately I don't have any openings tomorrow- at any time.  My colleagues each have a morning visit available.  Not sure if other offices have afternoon availability.  I would encourage an in-person visit as dizziness is very difficult to assess via virtual visit.  If we cannot accommodate pt's appt preferences, I do recommend UC

## 2024-03-26 ENCOUNTER — Encounter (HOSPITAL_BASED_OUTPATIENT_CLINIC_OR_DEPARTMENT_OTHER): Payer: Self-pay | Admitting: Emergency Medicine

## 2024-03-26 ENCOUNTER — Ambulatory Visit (HOSPITAL_BASED_OUTPATIENT_CLINIC_OR_DEPARTMENT_OTHER): Admission: EM | Admit: 2024-03-26 | Discharge: 2024-03-26 | Disposition: A | Discharge status: Home / Self Care

## 2024-03-26 DIAGNOSIS — R519 Headache, unspecified: Secondary | ICD-10-CM

## 2024-03-26 MED ORDER — KETOROLAC TROMETHAMINE 30 MG/ML IJ SOLN
30.0000 mg | Freq: Once | INTRAMUSCULAR | Status: AC
  Administered 2024-03-26: 30 mg via INTRAMUSCULAR

## 2024-03-26 MED ORDER — DEXAMETHASONE SOD PHOSPHATE PF 10 MG/ML IJ SOLN
10.0000 mg | Freq: Once | INTRAMUSCULAR | Status: AC
  Administered 2024-03-26: 10 mg via INTRAMUSCULAR

## 2024-03-26 NOTE — Telephone Encounter (Signed)
 Patient has appt on 03/30/24 to discuss concerns.

## 2024-03-26 NOTE — Telephone Encounter (Signed)
 Called patient and she cannot get off work. I gave her several Uc's in the area that she can go too. Patient verbalized

## 2024-03-26 NOTE — ED Provider Notes (Signed)
 [facility]    CSN: [id number] Arrival date & time: 03/26/24  1723      History   Chief Complaint No chief complaint on file.   HPI Jessica Diaz is a 33 y.o. female.   Pt c/o headache since last Friday. Pt has taken tylenol  and it helps the pain some but not completely. Pt reports the pain is mainly on the right side of her head      Past Medical History:  Diagnosis Date   Arrhythmia    non sustained VT needs cardiac monitoring in labor   Bleeding in early pregnancy 02/24/2014   Endometriosis 2008   lap endometriosis fulgration- 2008   Family history of anesthesia complication    mother and father have N/V   Hemorrhoids    History of herpetic whitlow 09/2016   History of kidney stones 2014   Sx: cystoscopy w/retrograde pyelogram, ureteroscopy and stent placement   History of pilonidal cyst    Hx of left knee surgery 2007   Hx of varicella    Hx of wisdom tooth extraction 2013   Intermittent palpitations    Migraine    Nonsustained ventricular tachycardia (HCC)    Ovarian cyst    Left   Personal history of DVT (deep vein thrombosis) 2014   blood clot in right arm post renal stone surgery 01/12/13-on blood thinnners (Lovenox )   PONV (postoperative nausea and vomiting)    did not have N/V after cysto   Postpartum care following vaginal delivery (6/7) 09/20/2014   Subchorionic hemorrhage, antepartum 2016   Syncope 2013   UTI (lower urinary tract infection)     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   Physical exam 08/23/2021   Status post placement of implantable loop recorder 01/21/2019   NSVT (nonsustained ventricular tachycardia) (HCC) 09/02/2018   Calf pain 03/31/2015   Thrombophilia affecting pregnancy in third trimester, antepartum 09/19/2014   Bleeding in early pregnancy 02/24/2014   Breast pain, right 07/12/2013   Wrist pain, right 01/27/2013   Ureteral calculi  01/12/2013   Pilonidal cyst 09/25/2012   Abnormal TSH 11/22/2011   Migraine 11/22/2011   HEMATURIA, HX OF 08/08/2009   ALLERGIC RHINITIS 06/28/2009   SYNCOPE 04/26/2008   CARDIAC MURMUR, AORTIC 07/13/2007   ENDOMETRIOSIS, SITE UNSPECIFIED 06/25/2007   PALPITATIONS, HX OF 06/25/2007    Past Surgical History:  Procedure Laterality Date   ABLATION ON ENDOMETRIOSIS  10/24/2017   Procedure: ABLATION ON ENDOMETRIOSIS;  Surgeon: Gorge Ade, MD;  Location: [facility];  Service: Gynecology;;   APPENDECTOMY  10/16/2020   CYSTOSCOPY WITH RETROGRADE PYELOGRAM, URETEROSCOPY AND STENT PLACEMENT Right 01/12/2013   Procedure: CYSTOSCOPY WITH RETROGRADE PYELOGRAM, URETEROSCOPY AND STENT PLACEMENT;  Surgeon: Alm GORMAN Fragmin, MD;  Location: [facility];  Service: Urology;  Laterality: Right;  with stone basket extraction   KNEE SURGERY Left 2007   LAPAROSCOPIC APPENDECTOMY N/A 10/17/2020   Procedure: APPENDECTOMY LAPAROSCOPIC;  Surgeon: Signe Mitzie LABOR, MD;  [facility];  Service: General;  Laterality: N/A;   LAPAROSCOPIC ENDOMETRIOSIS FULGURATION  2008   LAPAROSCOPY  10/24/2017   Procedure: LAPAROSCOPY DIAGNOSTIC;  Surgeon: Gorge Ade, MD;  Location: [facility];  Service: Gynecology;;   WISDOM TOOTH EXTRACTION  2013   two lowers and one upper    OB History     Gravida  2   Para  2   Term  2   Preterm  AB      Living  2      SAB      IAB      Ectopic      Multiple  0   Live Births  2            Home Medications    Prior to Admission medications  Medication Sig Start Date End Date Taking? Authorizing Provider  aspirin EC 325 MG tablet Take 325 mg by mouth every 6 (six) hours as needed for mild pain.   Yes [provider]  fluticasone  (FLONASE ) 50 MCG/ACT nasal spray Place 2 sprays into both nostrils daily.   Yes [provider]  loratadine  (CLARITIN ) 10 MG tablet Take 10 mg by mouth daily.   Yes [provider]  valACYclovir  (VALTREX )  1000 MG tablet Take 1 tablet (1,000 mg total) by mouth daily. 02/27/24  Yes Tabori, Katherine E, MD  acetaminophen  (TYLENOL ) 500 MG tablet Take 2 tablets (1,000 mg total) by mouth every 6 (six) hours as needed for mild pain or moderate pain. 10/23/20   Rosalba Glendale DEL, PA-C    Family History Family History  Problem Relation Age of Onset   Heart attack Maternal Grandfather    Breast cancer Mother 73   Gestational diabetes Mother    Hypertension Mother    Hyperlipidemia Mother    Heart disease Father        SBE, aorta replacement @ age 32   Hypertension Father    Hyperlipidemia Father    Lung cancer Other        paternal side   Diabetes Maternal Grandmother    Healthy Sister        x3    Social History Social History[1]   Allergies   Codeine, Decongestant [oxymetazoline], Percocet [oxycodone -acetaminophen ], and Latex   Review of Systems Review of Systems   Physical Exam Triage Vital Signs ED Triage Vitals  Encounter Vitals Group     BP 03/26/24 1828 128/85     Girls Systolic BP Percentile --      Girls Diastolic BP Percentile --      Boys Systolic BP Percentile --      Boys Diastolic BP Percentile --      Pulse Rate 03/26/24 1828 66     Resp 03/26/24 1828 18     Temp 03/26/24 1828 97.8 F (36.6 C)     Temp Source 03/26/24 1828 Oral     SpO2 03/26/24 1828 98 %     Weight --      Height --      Head Circumference --      Peak Flow --      Pain Score 03/26/24 1826 6     Pain Loc --      Pain Education --      Exclude from Growth Chart --    No data found.  Updated Vital Signs BP 128/85 (BP Location: Right Arm)   Pulse 66   Temp 97.8 F (36.6 C) (Oral)   Resp 18   LMP 03/17/2024   SpO2 98%   Visual Acuity Right Eye Distance:   Left Eye Distance:   Bilateral Distance:    Right Eye Near:   Left Eye Near:    Bilateral Near:     Physical Exam   UC Treatments / Results  Labs (all labs ordered are listed, but only abnormal results are  displayed) Labs Reviewed - No data to display  EKG  Radiology No results found.  Procedures Procedures (including critical care time)  Medications Ordered in UC Medications  ketorolac  (TORADOL ) 30 MG/ML injection 30 mg (30 mg Intramuscular Given 03/26/24 1934)  dexamethasone  (DECADRON ) injection 10 mg (10 mg Intramuscular Given 03/26/24 1933)    Initial Impression / Assessment and Plan / UC Course  I have reviewed the triage vital signs and the nursing notes.  Pertinent labs & imaging results that were available during my care of the patient were reviewed by me and considered in my medical decision making (see chart for details).     *** Final Clinical Impressions(s) / UC Diagnoses   Final diagnoses:  Nonintractable headache, unspecified chronicity pattern, unspecified headache type     Discharge Instructions      Treating you for a migraine here today.  Toradol  and Decadron  given here to hopefully help with your headache and pain.  If this does not resolve I would recommend going to the ER for a CT scan.  Or call your doctor on Monday for an order and doing 1 outpatient. Recommend resting and drinking some fluids. You can take magnesium Glycinate  this is been noted to help with headaches.    ED Prescriptions   None    PDMP not reviewed this encounter.    [1]  Social History Tobacco Use   Smoking status: Never   Smokeless tobacco: Never   Tobacco comments:    never used product  Vaping Use   Vaping status: Never Used  Substance Use Topics   Alcohol use: No    Alcohol/week: 0.0 standard drinks of alcohol   Drug use: No

## 2024-03-26 NOTE — ED Triage Notes (Signed)
 Pt c/o headache since last Friday. Pt has taken tylenol  and it helps the pain some but not completely. Pt reports the pain is mainly on the right side of her head

## 2024-03-26 NOTE — Discharge Instructions (Signed)
 Treating you for a migraine here today.  Toradol  and Decadron  given here to hopefully help with your headache and pain.  If this does not resolve I would recommend going to the ER for a CT scan.  Or call your doctor on Monday for an order and doing 1 outpatient. Recommend resting and drinking some fluids. You can take magnesium Glycinate  this is been noted to help with headaches.

## 2024-03-30 ENCOUNTER — Ambulatory Visit: Admitting: Family Medicine

## 2024-03-30 VITALS — BP 120/82 | HR 80 | Temp 98.1°F | Ht 65.0 in | Wt 169.5 lb

## 2024-03-30 DIAGNOSIS — R519 Headache, unspecified: Secondary | ICD-10-CM

## 2024-03-30 DIAGNOSIS — R55 Syncope and collapse: Secondary | ICD-10-CM

## 2024-03-30 DIAGNOSIS — H539 Unspecified visual disturbance: Secondary | ICD-10-CM

## 2024-03-30 MED ORDER — METOCLOPRAMIDE HCL 5 MG PO TABS: 5.0000 mg | ORAL_TABLET | Freq: Four times a day (QID) | ORAL | 0 refills | Status: AC | PRN

## 2024-03-30 MED ORDER — NAPROXEN 500 MG PO TABS: 500.0000 mg | ORAL_TABLET | Freq: Two times a day (BID) | ORAL | 0 refills | Status: AC

## 2024-03-30 MED ORDER — SUMATRIPTAN SUCCINATE 50 MG PO TABS: 50.0000 mg | ORAL_TABLET | Freq: Every day | ORAL | 5 refills | Status: DC | PRN

## 2024-03-30 NOTE — Progress Notes (Unsigned)
 Acute Office Visit  Subjective:     Patient ID: Jessica Diaz, female    DOB: 1990-07-05, 33 y.o.   MRN: [id number]  Chief Complaint  Patient presents with   Headache    Patient complains of right-sided headaches and pain at the base of the skull x9 days, seen at an urgent care, given cocktail with no relief, noticed pain worsened after stepping out in sunlight 4 days ago    Headache  Pertinent negatives include no fever.   Discussed the use of AI scribe software for clinical note transcription with the patient, who gave verbal consent to proceed.  History of Present Illness   Jessica Diaz is a 33 year old female who presents with right-sided headaches and vision changes.  She has had right-sided headaches since last Friday, with minimal relief from a headache cocktail at urgent care. Pain recurs daily, is worse with sunlight and LED lights, radiates into her skull and neck, and is severe enough that she feels she might pass out. She has wavy vision, brain fog, impaired depth perception with driving, and balance problems but no nausea, vomiting, numbness, or tingling.  She reports several syncope episodes over the past few months. She had migraines after the birth of her first son that resolved after about a year and a half of migraine medication, which she then stopped. She currently only uses Tylenol  and is hesitant to try stronger medications. She has a loop recorder that was reported as non-functioning and not readable at her last heart appointment in July.       Review of Systems  Constitutional:  Negative for chills and fever.  Neurological:  Positive for headaches.  All other systems reviewed and are negative.       Objective:    BP 120/82   Pulse 80   Temp 98.1 F (36.7 C) (Oral)   Ht 5' 5 (1.651 m)   Wt 169 lb 8 oz (76.9 kg)   LMP 03/17/2024   SpO2 98%   BMI 28.21 kg/m    Physical Exam Vitals reviewed.  Constitutional:      General: She is  not in acute distress.    Appearance: She is well-developed and normal weight.  Eyes:     Extraocular Movements: Extraocular movements intact.     Pupils: Pupils are equal, round, and reactive to light.  Cardiovascular:     Rate and Rhythm: Normal rate and regular rhythm.     Heart sounds: No murmur heard. Pulmonary:     Effort: Pulmonary effort is normal.     Breath sounds: Normal breath sounds. No wheezing.  Neurological:     Mental Status: She is alert and oriented to person, place, and time.     Cranial Nerves: No cranial nerve deficit.     Motor: No weakness.  Psychiatric:        Mood and Affect: Mood normal.        Behavior: Behavior normal.     No results found for any visits on 03/30/24.      Assessment & Plan:   Problem List Items Addressed This Visit   None Visit Diagnoses       Acute intractable headache, unspecified headache type    -  Primary   Relevant Medications   SUMAtriptan  (IMITREX ) 50 MG tablet   metoCLOPramide  (REGLAN ) 5 MG tablet   naproxen  (NAPROSYN ) 500 MG tablet   Other Relevant Orders   MR Brain W Wo Contrast  Vision changes       Relevant Orders   MR Brain W Wo Contrast     Syncope, unspecified syncope type       Relevant Orders   MR Brain W Wo Contrast     Assessment and Plan    Migraine with visual disturbance Acute right-sided headache with photophobia and visual disturbances, including wavy vision and brain fog. Symptoms exacerbated by sunlight exposure. No chronic migraine history, but postpartum migraines previously treated. Differential includes migraine versus other neurological causes due to atypical presentation and associated symptoms. MRI of the brain is warranted to rule out other causes given the presence of neurological symptoms. - Ordered MRI of the brain to rule out other causes of symptoms. - Prescribed sumatriptan , one tablet at headache onset, with a second tablet after two hours if needed, not exceeding two tablets  in 24 hours. - Prescribed naproxen , one tablet twice daily with food to prevent stomach upset. - Prescribed anti-nausea medication, one tablet up to four times daily as needed. - Advised to seek emergency care if neurological symptoms worsen.  Syncope Intermittent episodes of syncope over the past few months, with recent severe headache and balance issues. No clear etiology identified, but further evaluation is necessary. - Continue to monitor symptoms and seek further evaluation if syncope episodes persist or worsen.        Meds ordered this encounter  Medications   SUMAtriptan  (IMITREX ) 50 MG tablet    Sig: Take 1 tablet (50 mg total) by mouth daily as needed for migraine. May repeat in 2 hours if headache persists or recurs. Do not take more than 2 tablets in a 24 hour period.    Dispense:  10 tablet    Refill:  5   metoCLOPramide  (REGLAN ) 5 MG tablet    Sig: Take 1 tablet (5 mg total) by mouth every 6 (six) hours as needed for nausea.    Dispense:  30 tablet    Refill:  0   naproxen  (NAPROSYN ) 500 MG tablet    Sig: Take 1 tablet (500 mg total) by mouth 2 (two) times daily with a meal.    Dispense:  30 tablet    Refill:  0    No follow-ups on file.  Heron CHRISTELLA Sharper, MD

## 2024-04-05 ENCOUNTER — Telehealth: Payer: Self-pay | Admitting: Family Medicine

## 2024-04-05 NOTE — Telephone Encounter (Signed)
 Copied from CRM #[id number]. Topic: General - Other >> Apr 02, 2024  4:12 PM Roselie BROCKS wrote: Reason for CRM: Arland from [facility] states she received a referral for a MRI scheduled for 04-06-24 and needs to speak to someone about insurance authorization And request a call back.  Pt seen Dr. Ozell Bodily. 03/30/24.

## 2024-04-06 ENCOUNTER — Ambulatory Visit (HOSPITAL_BASED_OUTPATIENT_CLINIC_OR_DEPARTMENT_OTHER)
Admission: RE | Admit: 2024-04-06 | Discharge: 2024-04-06 | Disposition: A | Discharge status: Home / Self Care | Source: Ambulatory Visit | Attending: Family Medicine | Admitting: Family Medicine

## 2024-04-06 ENCOUNTER — Other Ambulatory Visit: Payer: Self-pay | Admitting: Family Medicine

## 2024-04-06 DIAGNOSIS — H539 Unspecified visual disturbance: Secondary | ICD-10-CM

## 2024-04-06 DIAGNOSIS — R55 Syncope and collapse: Secondary | ICD-10-CM

## 2024-04-06 DIAGNOSIS — R519 Headache, unspecified: Secondary | ICD-10-CM | POA: Diagnosis not present

## 2024-04-19 ENCOUNTER — Ambulatory Visit: Payer: Self-pay | Admitting: Family Medicine

## 2024-04-23 ENCOUNTER — Telehealth: Admitting: Family Medicine

## 2024-04-23 ENCOUNTER — Telehealth: Payer: Self-pay

## 2024-04-23 ENCOUNTER — Encounter: Payer: Self-pay | Admitting: Family Medicine

## 2024-04-23 VITALS — BP 124/80 | Ht 64.0 in | Wt 169.0 lb

## 2024-04-23 DIAGNOSIS — R079 Chest pain, unspecified: Secondary | ICD-10-CM | POA: Diagnosis not present

## 2024-04-23 DIAGNOSIS — G43911 Migraine, unspecified, intractable, with status migrainosus: Secondary | ICD-10-CM | POA: Diagnosis not present

## 2024-04-23 MED ORDER — PREDNISONE 10 MG PO TABS: ORAL_TABLET | ORAL | 0 refills | Status: AC

## 2024-04-23 MED ORDER — RIZATRIPTAN BENZOATE 10 MG PO TABS: 10.0000 mg | ORAL_TABLET | ORAL | 3 refills | Status: AC | PRN

## 2024-04-23 NOTE — Telephone Encounter (Signed)
 Patient is seeing you at 1:40pm today   Copied from CRM [id number]. Topic: General - Other >> Apr 23, 2024  8:49 AM Victoria A wrote: Reason for CRM: Patient's father called and wanted to let Dr.Tabori know that patient has been having chest pain. Father said that patient will not let Dr.Tabori know about this but he wants Dr.Tabori to be aware and ask patient about.

## 2024-04-23 NOTE — Progress Notes (Unsigned)
 "   Virtual Visit via Video   I connected with patient on 04/23/2024 at  1:40 PM EST by a video enabled telemedicine application and verified that I am speaking with the correct person using two identifiers.  Location patient: Home Location provider: [facility] Persons participating in the virtual visit: Patient, Provider, CMA Beverley V)  I discussed the limitations of evaluation and management by telemedicine and the availability of in person appointments. The patient expressed understanding and agreed to proceed.  Subjective:   HPI:   Migraine- pt went to UC on 12/12 for ongoing HA.  Had visual changes- 'doorways were wavy'.  Was given Toradol  and Decadron  in office and told to add Magnesium Glycinate.  On 12/16 went to [place] office for HA and was tx'd w/ Imitrex , Reglan , and Naproxen .  MRI was ordered- WNL.  Continues to have daily HA's- 'all day every day'.  Can have associated blurry vision.  + sensitivity to light and sound.  Will have nausea if driving or riding in the car.  HA is mostly R sided but will generalize across forehead.  Not currently taking Reglan , Naproxen , or Imitrex .  Denies increased stress recently.  CP- pt reports in the past 3 weeks she has had more frequent CP's.  'they take my breath away'.  Sees Cardiology.  ROS:   See pertinent positives and negatives per HPI.  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   Physical exam 08/23/2021   Status post placement of implantable loop recorder 01/21/2019   NSVT (nonsustained ventricular tachycardia) (HCC) 09/02/2018   Calf pain 03/31/2015   Thrombophilia affecting pregnancy in third trimester, antepartum 09/19/2014   Bleeding in early pregnancy 02/24/2014   Breast pain, right 07/12/2013   Wrist pain, right 01/27/2013   Ureteral calculi 01/12/2013   Pilonidal cyst 09/25/2012   Abnormal TSH 11/22/2011   Migraine 11/22/2011    HEMATURIA, HX OF 08/08/2009   ALLERGIC RHINITIS 06/28/2009   SYNCOPE 04/26/2008   CARDIAC MURMUR, AORTIC 07/13/2007   ENDOMETRIOSIS, SITE UNSPECIFIED 06/25/2007   PALPITATIONS, HX OF 06/25/2007    Social History   Tobacco Use   Smoking status: Never   Smokeless tobacco: Never   Tobacco comments:    never used product  Substance Use Topics   Alcohol use: No    Alcohol/week: 0.0 standard drinks of alcohol   Current Medications[1]  Allergies[2]  Objective:   BP 124/80 Comment: patient reported via telephone visit  Ht 5' 4 (1.626 m)   Wt 169 lb (76.7 kg)   LMP 03/17/2024   BMI 29.01 kg/m   Patient is well-developed, well-nourished in no acute distress.  Resting comfortably *** at home.  Head is normocephalic, atraumatic.  No labored breathing.  Speech is clear and coherent with logical content.  Patient is alert and oriented at baseline.  ***  Assessment and Plan:        ***.   Jessica Greet, MD 04/23/2024  Time spent with the patient: *** minutes, of which >50% was spent in obtaining information about symptoms, reviewing previous labs, evaluations, and treatments, counseling about condition (please see the discussed topics above), and developing a plan to further investigate it; had a number of questions which I addressed.      [1]  Current Outpatient Medications:    acetaminophen  (TYLENOL ) 500 MG tablet, Take 2 tablets (1,000 mg total) by mouth every 6 (six) hours as needed for mild pain  or moderate pain., Disp: 30 tablet, Rfl: 0   aspirin EC 325 MG tablet, Take 325 mg by mouth every 6 (six) hours as needed for mild pain., Disp: , Rfl:    fluticasone  (FLONASE ) 50 MCG/ACT nasal spray, Place 2 sprays into both nostrils daily., Disp: , Rfl:    loratadine  (CLARITIN ) 10 MG tablet, Take 10 mg by mouth daily., Disp: , Rfl:    valACYclovir  (VALTREX ) 1000 MG tablet, Take 1 tablet (1,000 mg total) by mouth daily., Disp: 90 tablet, Rfl: 0   metoCLOPramide  (REGLAN ) 5 MG  tablet, Take 1 tablet (5 mg total) by mouth every 6 (six) hours as needed for nausea. (Patient not taking: Reported on 04/23/2024), Disp: 30 tablet, Rfl: 0   naproxen  (NAPROSYN ) 500 MG tablet, Take 1 tablet (500 mg total) by mouth 2 (two) times daily with a meal. (Patient not taking: Reported on 04/23/2024), Disp: 30 tablet, Rfl: 0   SUMAtriptan  (IMITREX ) 50 MG tablet, Take 1 tablet (50 mg total) by mouth daily as needed for migraine. May repeat in 2 hours if headache persists or recurs. Do not take more than 2 tablets in a 24 hour period. (Patient not taking: Reported on 04/23/2024), Disp: 10 tablet, Rfl: 5 [2]  Allergies Allergen Reactions   Codeine Other (See Comments)    Pt states this medication causes her bp to rise.     Decongestant [Oxymetazoline] Other (See Comments)    Pt states this medication causes her bp to drop.     Percocet [Oxycodone -Acetaminophen ] Other (See Comments)    hallucinations.     Latex Rash   "

## 2024-04-23 NOTE — Telephone Encounter (Signed)
 noted

## 2025-01-26 ENCOUNTER — Encounter: Admitting: Family Medicine
